# Patient Record
Sex: Female | Born: 1975
Health system: Southern US, Community
[De-identification: ages and names within clinical notes are randomized; demographics above are authoritative.]

## PROBLEM LIST (undated history)

## (undated) ENCOUNTER — Emergency Department (HOSPITAL_COMMUNITY): Payer: Self-pay

## (undated) DIAGNOSIS — G35 Multiple sclerosis: Secondary | ICD-10-CM

## (undated) DIAGNOSIS — J40 Bronchitis, not specified as acute or chronic: Secondary | ICD-10-CM

## (undated) DIAGNOSIS — F5104 Psychophysiologic insomnia: Secondary | ICD-10-CM

## (undated) DIAGNOSIS — K219 Gastro-esophageal reflux disease without esophagitis: Secondary | ICD-10-CM

## (undated) DIAGNOSIS — D509 Iron deficiency anemia, unspecified: Secondary | ICD-10-CM

## (undated) HISTORY — DX: Iron deficiency anemia, unspecified: D50.9

## (undated) HISTORY — DX: Psychophysiologic insomnia: F51.04

## (undated) HISTORY — DX: Morbid (severe) obesity due to excess calories: E66.01

## (undated) HISTORY — DX: Gastro-esophageal reflux disease without esophagitis: K21.9

---

## 1998-04-24 ENCOUNTER — Emergency Department (HOSPITAL_COMMUNITY): Admission: EM | Admit: 1998-04-24 | Discharge: 1998-04-24 | Payer: Self-pay | Admitting: Emergency Medicine

## 1998-11-23 ENCOUNTER — Emergency Department (HOSPITAL_COMMUNITY): Admission: EM | Admit: 1998-11-23 | Discharge: 1998-11-23 | Payer: Self-pay | Admitting: Emergency Medicine

## 1998-11-25 ENCOUNTER — Emergency Department (HOSPITAL_COMMUNITY): Admission: EM | Admit: 1998-11-25 | Discharge: 1998-11-25 | Payer: Self-pay | Admitting: Internal Medicine

## 1999-10-04 ENCOUNTER — Encounter: Payer: Self-pay | Admitting: Emergency Medicine

## 1999-10-04 ENCOUNTER — Emergency Department (HOSPITAL_COMMUNITY): Admission: EM | Admit: 1999-10-04 | Discharge: 1999-10-04 | Payer: Self-pay | Admitting: Emergency Medicine

## 2000-07-31 ENCOUNTER — Emergency Department (HOSPITAL_COMMUNITY): Admission: EM | Admit: 2000-07-31 | Discharge: 2000-07-31 | Payer: Self-pay | Admitting: Emergency Medicine

## 2000-08-01 ENCOUNTER — Emergency Department (HOSPITAL_COMMUNITY): Admission: EM | Admit: 2000-08-01 | Discharge: 2000-08-01 | Payer: Self-pay | Admitting: Emergency Medicine

## 2000-08-04 ENCOUNTER — Emergency Department (HOSPITAL_COMMUNITY): Admission: EM | Admit: 2000-08-04 | Discharge: 2000-08-04 | Payer: Self-pay | Admitting: Emergency Medicine

## 2000-08-30 ENCOUNTER — Emergency Department (HOSPITAL_COMMUNITY): Admission: EM | Admit: 2000-08-30 | Discharge: 2000-08-30 | Payer: Self-pay | Admitting: Internal Medicine

## 2000-09-16 ENCOUNTER — Encounter: Payer: Self-pay | Admitting: Emergency Medicine

## 2000-09-16 ENCOUNTER — Emergency Department (HOSPITAL_COMMUNITY): Admission: EM | Admit: 2000-09-16 | Discharge: 2000-09-16 | Payer: Self-pay | Admitting: Emergency Medicine

## 2000-11-10 ENCOUNTER — Emergency Department (HOSPITAL_COMMUNITY): Admission: EM | Admit: 2000-11-10 | Discharge: 2000-11-10 | Payer: Self-pay | Admitting: Emergency Medicine

## 2000-11-15 ENCOUNTER — Other Ambulatory Visit: Admission: RE | Admit: 2000-11-15 | Discharge: 2000-11-15 | Payer: Self-pay | Admitting: Obstetrics

## 2001-11-01 ENCOUNTER — Emergency Department (HOSPITAL_COMMUNITY): Admission: EM | Admit: 2001-11-01 | Discharge: 2001-11-02 | Payer: Self-pay | Admitting: Emergency Medicine

## 2002-03-17 ENCOUNTER — Emergency Department (HOSPITAL_COMMUNITY): Admission: EM | Admit: 2002-03-17 | Discharge: 2002-03-17 | Payer: Self-pay | Admitting: Emergency Medicine

## 2002-03-17 ENCOUNTER — Encounter: Payer: Self-pay | Admitting: Emergency Medicine

## 2003-02-13 ENCOUNTER — Emergency Department (HOSPITAL_COMMUNITY): Admission: EM | Admit: 2003-02-13 | Discharge: 2003-02-13 | Payer: Self-pay | Admitting: Emergency Medicine

## 2004-07-07 ENCOUNTER — Emergency Department (HOSPITAL_COMMUNITY): Admission: EM | Admit: 2004-07-07 | Discharge: 2004-07-07 | Payer: Self-pay | Admitting: Emergency Medicine

## 2005-07-13 ENCOUNTER — Emergency Department (HOSPITAL_COMMUNITY): Admission: EM | Admit: 2005-07-13 | Discharge: 2005-07-13 | Payer: Self-pay | Admitting: Emergency Medicine

## 2005-10-31 ENCOUNTER — Emergency Department (HOSPITAL_COMMUNITY): Admission: EM | Admit: 2005-10-31 | Discharge: 2005-10-31 | Payer: Self-pay | Admitting: Emergency Medicine

## 2006-03-07 ENCOUNTER — Emergency Department (HOSPITAL_COMMUNITY): Admission: EM | Admit: 2006-03-07 | Discharge: 2006-03-07 | Payer: Self-pay | Admitting: Family Medicine

## 2006-03-08 ENCOUNTER — Emergency Department (HOSPITAL_COMMUNITY): Admission: EM | Admit: 2006-03-08 | Discharge: 2006-03-08 | Payer: Self-pay | Admitting: Emergency Medicine

## 2006-05-09 ENCOUNTER — Emergency Department (HOSPITAL_COMMUNITY): Admission: EM | Admit: 2006-05-09 | Discharge: 2006-05-09 | Payer: Self-pay | Admitting: Emergency Medicine

## 2006-05-17 ENCOUNTER — Other Ambulatory Visit: Admission: RE | Admit: 2006-05-17 | Discharge: 2006-05-17 | Payer: Self-pay | Admitting: *Deleted

## 2006-11-02 ENCOUNTER — Emergency Department (HOSPITAL_COMMUNITY): Admission: EM | Admit: 2006-11-02 | Discharge: 2006-11-02 | Payer: Self-pay | Admitting: Emergency Medicine

## 2006-11-17 ENCOUNTER — Inpatient Hospital Stay (HOSPITAL_COMMUNITY): Admission: AD | Admit: 2006-11-17 | Discharge: 2006-11-18 | Payer: Self-pay | Admitting: Obstetrics

## 2006-11-22 ENCOUNTER — Ambulatory Visit (HOSPITAL_COMMUNITY): Admission: RE | Admit: 2006-11-22 | Discharge: 2006-11-22 | Payer: Self-pay | Admitting: Obstetrics

## 2006-12-06 ENCOUNTER — Ambulatory Visit (HOSPITAL_COMMUNITY): Admission: RE | Admit: 2006-12-06 | Discharge: 2006-12-06 | Payer: Self-pay | Admitting: Obstetrics

## 2006-12-20 ENCOUNTER — Ambulatory Visit (HOSPITAL_COMMUNITY): Admission: RE | Admit: 2006-12-20 | Discharge: 2006-12-20 | Payer: Self-pay | Admitting: Obstetrics

## 2007-02-06 ENCOUNTER — Inpatient Hospital Stay (HOSPITAL_COMMUNITY): Admission: AD | Admit: 2007-02-06 | Discharge: 2007-02-06 | Payer: Self-pay | Admitting: Obstetrics

## 2007-03-16 ENCOUNTER — Inpatient Hospital Stay (HOSPITAL_COMMUNITY): Admission: AD | Admit: 2007-03-16 | Discharge: 2007-03-16 | Payer: Self-pay | Admitting: Obstetrics

## 2007-04-09 ENCOUNTER — Encounter (INDEPENDENT_AMBULATORY_CARE_PROVIDER_SITE_OTHER): Payer: Self-pay | Admitting: Obstetrics

## 2007-04-09 ENCOUNTER — Inpatient Hospital Stay (HOSPITAL_COMMUNITY): Admission: AD | Admit: 2007-04-09 | Discharge: 2007-04-12 | Payer: Self-pay | Admitting: Obstetrics

## 2007-12-31 ENCOUNTER — Emergency Department (HOSPITAL_COMMUNITY): Admission: EM | Admit: 2007-12-31 | Discharge: 2007-12-31 | Payer: Self-pay | Admitting: Family Medicine

## 2008-09-14 ENCOUNTER — Inpatient Hospital Stay (HOSPITAL_COMMUNITY): Admission: AD | Admit: 2008-09-14 | Discharge: 2008-09-14 | Payer: Self-pay | Admitting: Obstetrics

## 2008-12-27 ENCOUNTER — Inpatient Hospital Stay (HOSPITAL_COMMUNITY): Admission: AD | Admit: 2008-12-27 | Discharge: 2008-12-27 | Payer: Self-pay | Admitting: Obstetrics

## 2009-01-14 ENCOUNTER — Inpatient Hospital Stay (HOSPITAL_COMMUNITY): Admission: AD | Admit: 2009-01-14 | Discharge: 2009-01-14 | Payer: Self-pay | Admitting: Obstetrics

## 2009-01-20 ENCOUNTER — Inpatient Hospital Stay (HOSPITAL_COMMUNITY): Admission: RE | Admit: 2009-01-20 | Discharge: 2009-01-23 | Payer: Self-pay | Admitting: Obstetrics

## 2009-09-01 ENCOUNTER — Inpatient Hospital Stay (HOSPITAL_COMMUNITY): Admission: AD | Admit: 2009-09-01 | Discharge: 2009-09-01 | Payer: Self-pay | Admitting: Obstetrics

## 2009-09-01 ENCOUNTER — Ambulatory Visit: Payer: Self-pay | Admitting: Family

## 2009-09-22 ENCOUNTER — Ambulatory Visit (HOSPITAL_COMMUNITY): Admission: RE | Admit: 2009-09-22 | Discharge: 2009-09-22 | Payer: Self-pay | Admitting: Obstetrics

## 2009-10-25 ENCOUNTER — Ambulatory Visit (HOSPITAL_COMMUNITY): Admission: RE | Admit: 2009-10-25 | Discharge: 2009-10-25 | Payer: Self-pay | Admitting: Obstetrics

## 2009-12-17 ENCOUNTER — Ambulatory Visit (HOSPITAL_COMMUNITY): Admission: RE | Admit: 2009-12-17 | Discharge: 2009-12-17 | Payer: Self-pay | Admitting: Obstetrics

## 2009-12-21 ENCOUNTER — Inpatient Hospital Stay (HOSPITAL_COMMUNITY): Admission: AD | Admit: 2009-12-21 | Discharge: 2009-12-22 | Payer: Self-pay | Admitting: Obstetrics

## 2010-02-19 ENCOUNTER — Inpatient Hospital Stay (HOSPITAL_COMMUNITY): Admission: AD | Admit: 2010-02-19 | Discharge: 2010-02-22 | Payer: Self-pay | Admitting: Obstetrics

## 2010-02-19 ENCOUNTER — Encounter (INDEPENDENT_AMBULATORY_CARE_PROVIDER_SITE_OTHER): Payer: Self-pay | Admitting: Obstetrics

## 2010-04-10 ENCOUNTER — Emergency Department (HOSPITAL_COMMUNITY): Admission: EM | Admit: 2010-04-10 | Discharge: 2010-04-10 | Payer: Self-pay | Admitting: Emergency Medicine

## 2010-10-26 ENCOUNTER — Ambulatory Visit (HOSPITAL_COMMUNITY)
Admission: RE | Admit: 2010-10-26 | Discharge: 2010-10-26 | Disposition: A | Discharge status: Home / Self Care | Payer: Medicaid Other | Source: Ambulatory Visit | Attending: Obstetrics | Admitting: Obstetrics

## 2010-10-26 ENCOUNTER — Other Ambulatory Visit (HOSPITAL_COMMUNITY): Payer: Self-pay | Admitting: Obstetrics

## 2010-10-26 DIAGNOSIS — R7611 Nonspecific reaction to tuberculin skin test without active tuberculosis: Secondary | ICD-10-CM

## 2010-11-28 LAB — CBC
HCT: 26.5 % — ABNORMAL LOW (ref 36.0–46.0)
HCT: 28.2 % — ABNORMAL LOW (ref 36.0–46.0)
Hemoglobin: 8.8 g/dL — ABNORMAL LOW (ref 12.0–15.0)
Hemoglobin: 9.2 g/dL — ABNORMAL LOW (ref 12.0–15.0)
MCHC: 33.4 g/dL (ref 30.0–36.0)
Platelets: 305 10*3/uL (ref 150–400)
Platelets: 335 10*3/uL (ref 150–400)
RDW: 15.4 % (ref 11.5–15.5)
WBC: 11 10*3/uL — ABNORMAL HIGH (ref 4.0–10.5)
WBC: 7.7 10*3/uL (ref 4.0–10.5)

## 2010-11-28 LAB — RPR: RPR Ser Ql: NONREACTIVE

## 2010-11-28 LAB — TYPE AND SCREEN: ABO/RH(D): O POS

## 2010-12-12 LAB — CBC
HCT: 32.8 % — ABNORMAL LOW (ref 36.0–46.0)
MCHC: 32.1 g/dL (ref 30.0–36.0)
RBC: 4.3 MIL/uL (ref 3.87–5.11)
RDW: 16.2 % — ABNORMAL HIGH (ref 11.5–15.5)
WBC: 7.2 10*3/uL (ref 4.0–10.5)

## 2010-12-12 LAB — WET PREP, GENITAL: Trich, Wet Prep: NONE SEEN

## 2010-12-12 LAB — GC/CHLAMYDIA PROBE AMP, GENITAL
Chlamydia, DNA Probe: NEGATIVE
GC Probe Amp, Genital: NEGATIVE

## 2010-12-20 LAB — CBC
HCT: 28.2 % — ABNORMAL LOW (ref 36.0–46.0)
HCT: 30.5 % — ABNORMAL LOW (ref 36.0–46.0)
MCHC: 34.3 g/dL (ref 30.0–36.0)
MCV: 77.5 fL — ABNORMAL LOW (ref 78.0–100.0)
Platelets: 283 10*3/uL (ref 150–400)
Platelets: 308 10*3/uL (ref 150–400)
WBC: 6.9 10*3/uL (ref 4.0–10.5)

## 2010-12-20 LAB — CCBB MATERNAL DONOR DRAW

## 2010-12-21 LAB — RAPID STREP SCREEN (MED CTR MEBANE ONLY): Streptococcus, Group A Screen (Direct): NEGATIVE

## 2011-01-24 NOTE — Op Note (Signed)
Teresa Arnold, BUTTACAVOLI NO.:  [id number]   MEDICAL RECORD NO.:  [id number]          PATIENT TYPE:  INP   LOCATION:  9108                          FACILITY:  [facility]   PHYSICIAN:  Kathreen Cosier, M.D.DATE OF BIRTH:  1976/05/30   DATE OF PROCEDURE:  01/20/2009  DATE OF DISCHARGE:                               OPERATIVE REPORT   PREOPERATIVE DIAGNOSES:  Previous cesarean section at term, desires  repeat.   SURGEON:  Kathreen Cosier, MD.   FIRST ASSISTANT:  Charles A. Clearance Coots, MD   ANESTHESIA:  Spinal.   PROCEDURE:  The patient was placed on the operating table in supine  position after the spinal was administered.  Abdomen was prepped and  draped.  Bladder was emptied with Foley catheter.  Transverse suprapubic  incision was made through the old scar, carried down to the rectus  fascia.  Fascia was cleaned and incised to the length of incision.  Recti muscles were retracted laterally.  Peritoneum was incised  longitudinally.  Transverse incision made in the visceral peritoneum  above the bladder, bladder mobilized inferiorly.  Transverse lower  uterine incision made and the fluid was clear.  The patient delivered a  frank breech female, Apgars 8 and 9, weighing 6 pounds 13 ounces.  The  placenta was fundal, removed manually.  Uterine cavity cleaned with dry  laps.  Placenta sent to Labor and Delivery.  Uterine incision closed in  1 layer with continuous suture of #1 chromic.  Hemostasis was  satisfactory.  Uterus well contracted.  Tubes and ovaries normal.  Abdomen closed in layers; peritoneum continuous suture of 0-chromic;  fascia continuous suture of 0-Dexon; and the skin closed with  subcuticular stitch of 4-0 Monocryl.  Blood loss 500 mL.           ______________________________  Kathreen Cosier, M.D.     BAM/MEDQ  D:  01/20/2009  T:  01/21/2009  Job:  [id number]

## 2011-01-24 NOTE — Op Note (Signed)
[name], Teresa Arnold NO.:  [id number]   MEDICAL RECORD NO.:  [id number]          PATIENT TYPE:  INP   LOCATION:  9101                          FACILITY:  [facility]   PHYSICIAN:  Kathreen Cosier, M.D.DATE OF BIRTH:  26-Apr-1976   DATE OF PROCEDURE:  04/09/2007  DATE OF DISCHARGE:                               OPERATIVE REPORT   PREOPERATIVE DIAGNOSIS:  Previous cesarean section at term, desires  repeat, ruptured membranes.   SURGEON:  Dr. Gaynell Face   ANESTHESIA:  Spinal.   PROCEDURE:  The patient placed on the operating table in supine position  after the spinal administered.  Abdomen prepped and draped, bladder  emptied with Foley catheter.  Transverse incision made through the old  scar, carried down to rectus fascia. Fascia cleaned and incised length  of incision.  Recti muscles retracted laterally.  Peritoneum incised  longitudinally. Transverse incision made in the visceral peritoneum of  the bladder.  Bladder mobilized inferiorly.  Transverse lower uterine  incision made.  Fluid clear.  The patient delivered from the LOA  position of a female Apgar nine and nine, weighing 5 pounds 8 ounces.  The team was in attendance.  The placenta was posterior sent to  pathology.  Uterine cavity cleaned with dry laps.  Uterine incision  closed in one layer with continuous suture of #1 chromic.  Hemostasis  satisfactory.  Bladder flap reattached with 2-0 chromic.  Uterus well  contracted.  Tubes and ovaries normal.  Abdomen closed in layers,  peritoneum continuous suture of 0 chromic, fascia continuous suture of 0  Dexon, skin closed with subcuticular stitch of 4-0 Monocryl.  Blood loss  500 mL.  The patient tolerated the procedure well and taken to the  recovery room in good condition.           ______________________________  Kathreen Cosier, M.D.     BAM/MEDQ  D:  04/09/2007  T:  04/10/2007  Job:  [id number]

## 2011-01-27 NOTE — Discharge Summary (Signed)
NAMESEATTLE, DALPORTO NO.:  [id number]   MEDICAL RECORD NO.:  [id number]          PATIENT TYPE:  INP   LOCATION:  9108                          FACILITY:  [facility]   PHYSICIAN:  Kathreen Cosier, M.D.DATE OF BIRTH:  October 07, 1975   DATE OF ADMISSION:  01/20/2009  DATE OF DISCHARGE:  01/23/2009                               DISCHARGE SUMMARY   The patient is a 35 year old gravida 4, para 1-1-1-2, EDC Jan 28, 2009.  She had C-sections x2 and she is now at term, negative GBS and desired  repeat C-section.  She had a repeat low transverse cesarean section, had  a female Apgar of 8 and 9, weighing 6 pounds 13 ounces, frank breech.  On admission, her hemoglobin was 10.3, postop 9.7, platelets 308 and  283.  RPR negative.  HIV negative.  She did well and was discharged on  the third postoperative day, ambulatory, on a regular diet,  to see me  in 6 weeks.   DISCHARGE DIAGNOSIS:  Status post repeat low transverse cesarean section  at term.           ______________________________  Kathreen Cosier, M.D.     BAM/MEDQ  D:  02/03/2009  T:  02/03/2009  Job:  [id number]

## 2011-01-27 NOTE — Discharge Summary (Signed)
[name], BOYUM NO.:  [id number]   MEDICAL RECORD NO.:  [id number]          PATIENT TYPE:  INP   LOCATION:  9101                          [facility]ILITY:  [facility]   PHYSICIAN:  Kathreen Cosier, M.D.DATE OF BIRTH:  1976-02-25   DATE OF ADMISSION:  04/09/2007  DATE OF DISCHARGE:  04/12/2007                               DISCHARGE SUMMARY   The patient is a 35 year old gravida 3, para 1-0-0-1 with EDC of April 23, 2007.  She had previous C-section at term, and desired repeat C-  section.  She was admitted at term with membranes ruptured.  She  underwent repeat low transverse cesarean section.  She had a female with  Apgars of 9 and 9, weighing 5 pounds, 8 ounces.   Postop the patient had some abnormal bleeding and was placed on  Methergine seeing that her hemoglobin was 8.  The patient was  symptomatic with hemoglobin of 8 and received two units of packed cells.  Post transfusion hemoglobin was 10.1.  She did well and was discharged  home on the third postoperative day.   DISCHARGE MEDICATIONS:  1. Iron.  2. Ferrous sulfate 325.  3. Tylox for pain.  4. Methergine.   DISCHARGE DIAGNOSES:  Status post repeat low transverse cesarean section  at term.           ______________________________  Kathreen Cosier, M.D.     BAM/MEDQ  D:  05/08/2007  T:  05/08/2007  Job:  [id number]

## 2011-03-08 ENCOUNTER — Emergency Department (HOSPITAL_COMMUNITY)
Admission: EM | Admit: 2011-03-08 | Discharge: 2011-03-09 | Disposition: A | Discharge status: Home / Self Care | Payer: Medicaid Other | Attending: Emergency Medicine | Admitting: Emergency Medicine

## 2011-03-08 DIAGNOSIS — H539 Unspecified visual disturbance: Secondary | ICD-10-CM | POA: Insufficient documentation

## 2011-03-08 DIAGNOSIS — M545 Low back pain, unspecified: Secondary | ICD-10-CM | POA: Insufficient documentation

## 2011-03-08 DIAGNOSIS — R209 Unspecified disturbances of skin sensation: Secondary | ICD-10-CM | POA: Insufficient documentation

## 2011-03-08 DIAGNOSIS — R5381 Other malaise: Secondary | ICD-10-CM | POA: Insufficient documentation

## 2011-03-09 ENCOUNTER — Encounter (HOSPITAL_COMMUNITY): Payer: Self-pay

## 2011-03-09 ENCOUNTER — Emergency Department (HOSPITAL_COMMUNITY): Payer: Medicaid Other

## 2011-03-09 LAB — URINALYSIS, ROUTINE W REFLEX MICROSCOPIC
Glucose, UA: NEGATIVE mg/dL
Hgb urine dipstick: NEGATIVE
Specific Gravity, Urine: 1.03 (ref 1.005–1.030)
pH: 6.5 (ref 5.0–8.0)

## 2011-03-09 LAB — BASIC METABOLIC PANEL
CO2: 27 mEq/L (ref 19–32)
Chloride: 101 mEq/L (ref 96–112)
Glucose, Bld: 93 mg/dL (ref 70–99)
Potassium: 3.6 mEq/L (ref 3.5–5.1)
Sodium: 138 mEq/L (ref 135–145)

## 2011-03-09 LAB — CBC
HCT: 34 % — ABNORMAL LOW (ref 36.0–46.0)
Hemoglobin: 10.8 g/dL — ABNORMAL LOW (ref 12.0–15.0)
MCHC: 31.8 g/dL (ref 30.0–36.0)
WBC: 9.6 10*3/uL (ref 4.0–10.5)

## 2011-03-09 LAB — POCT PREGNANCY, URINE: Preg Test, Ur: NEGATIVE

## 2011-03-09 LAB — DIFFERENTIAL
Eosinophils Relative: 1 % (ref 0–5)
Lymphs Abs: 3.6 10*3/uL (ref 0.7–4.0)
Monocytes Relative: 7 % (ref 3–12)

## 2011-04-03 ENCOUNTER — Other Ambulatory Visit: Payer: Self-pay | Admitting: Neurology

## 2011-04-03 DIAGNOSIS — R5381 Other malaise: Secondary | ICD-10-CM

## 2011-04-03 DIAGNOSIS — R5383 Other fatigue: Secondary | ICD-10-CM

## 2011-04-03 DIAGNOSIS — R209 Unspecified disturbances of skin sensation: Secondary | ICD-10-CM

## 2011-04-06 ENCOUNTER — Ambulatory Visit
Admission: RE | Admit: 2011-04-06 | Discharge: 2011-04-06 | Disposition: A | Discharge status: Home / Self Care | Payer: Medicaid Other | Source: Ambulatory Visit | Attending: Neurology | Admitting: Neurology

## 2011-04-06 DIAGNOSIS — R209 Unspecified disturbances of skin sensation: Secondary | ICD-10-CM

## 2011-04-06 DIAGNOSIS — R5381 Other malaise: Secondary | ICD-10-CM

## 2011-04-06 MED ORDER — GADOBENATE DIMEGLUMINE 529 MG/ML IV SOLN
10.0000 mL | Freq: Once | INTRAVENOUS | Status: AC | PRN
  Administered 2011-04-06: 10 mL via INTRAVENOUS

## 2011-05-22 ENCOUNTER — Ambulatory Visit: Payer: Medicaid Other | Attending: Neurology | Admitting: Rehabilitative and Restorative Service Providers"

## 2011-05-22 DIAGNOSIS — M6281 Muscle weakness (generalized): Secondary | ICD-10-CM | POA: Insufficient documentation

## 2011-05-22 DIAGNOSIS — IMO0001 Reserved for inherently not codable concepts without codable children: Secondary | ICD-10-CM | POA: Insufficient documentation

## 2011-05-22 DIAGNOSIS — R269 Unspecified abnormalities of gait and mobility: Secondary | ICD-10-CM | POA: Insufficient documentation

## 2011-05-29 ENCOUNTER — Ambulatory Visit: Payer: Medicaid Other | Admitting: Physical Therapy

## 2011-06-02 ENCOUNTER — Ambulatory Visit: Payer: Medicaid Other | Admitting: Physical Therapy

## 2011-06-05 ENCOUNTER — Ambulatory Visit: Payer: Medicaid Other | Admitting: Physical Therapy

## 2011-06-08 ENCOUNTER — Encounter (HOSPITAL_COMMUNITY)
Admission: RE | Admit: 2011-06-08 | Discharge: 2011-06-08 | Disposition: A | Discharge status: Home / Self Care | Payer: Medicaid Other | Source: Ambulatory Visit | Attending: Neurology | Admitting: Neurology

## 2011-06-08 DIAGNOSIS — G35 Multiple sclerosis: Secondary | ICD-10-CM | POA: Insufficient documentation

## 2011-06-09 ENCOUNTER — Ambulatory Visit: Payer: Medicaid Other | Admitting: Rehabilitative and Restorative Service Providers"

## 2011-06-13 ENCOUNTER — Ambulatory Visit: Payer: Medicaid Other | Admitting: Rehabilitative and Restorative Service Providers"

## 2011-06-15 ENCOUNTER — Ambulatory Visit: Payer: Medicaid Other | Admitting: Rehabilitative and Restorative Service Providers"

## 2011-06-20 ENCOUNTER — Ambulatory Visit: Payer: Medicaid Other | Admitting: Rehabilitative and Restorative Service Providers"

## 2011-06-22 ENCOUNTER — Ambulatory Visit: Payer: Medicaid Other | Admitting: Rehabilitative and Restorative Service Providers"

## 2011-06-26 LAB — CROSSMATCH: Antibody Screen: NEGATIVE

## 2011-06-26 LAB — CBC
HCT: 26.1 — ABNORMAL LOW
HCT: 30 — ABNORMAL LOW
HCT: 31 — ABNORMAL LOW
Hemoglobin: 10.1 — ABNORMAL LOW
Hemoglobin: 8.6 — ABNORMAL LOW
MCV: 75.5 — ABNORMAL LOW
Platelets: 334
RBC: 3.82 — ABNORMAL LOW
RBC: 4.1
RDW: 16.3 — ABNORMAL HIGH
WBC: 10.1
WBC: 9.1

## 2011-06-26 LAB — ABO/RH: ABO/RH(D): O POS

## 2011-06-27 LAB — URINALYSIS, ROUTINE W REFLEX MICROSCOPIC
Bilirubin Urine: NEGATIVE
Glucose, UA: NEGATIVE
Hgb urine dipstick: NEGATIVE
Specific Gravity, Urine: >1.03 — ABNORMAL HIGH
Urobilinogen, UA: 0.2
pH: 6

## 2011-07-06 ENCOUNTER — Ambulatory Visit: Payer: Medicaid Other | Attending: Neurology | Admitting: Rehabilitative and Restorative Service Providers"

## 2011-07-06 DIAGNOSIS — IMO0001 Reserved for inherently not codable concepts without codable children: Secondary | ICD-10-CM | POA: Insufficient documentation

## 2011-07-06 DIAGNOSIS — R269 Unspecified abnormalities of gait and mobility: Secondary | ICD-10-CM | POA: Insufficient documentation

## 2011-07-06 DIAGNOSIS — M6281 Muscle weakness (generalized): Secondary | ICD-10-CM | POA: Insufficient documentation

## 2011-07-07 ENCOUNTER — Encounter (HOSPITAL_COMMUNITY)
Admission: RE | Admit: 2011-07-07 | Discharge: 2011-07-07 | Disposition: A | Discharge status: Home / Self Care | Payer: Medicaid Other | Source: Ambulatory Visit | Attending: Neurology | Admitting: Neurology

## 2011-07-07 DIAGNOSIS — G35 Multiple sclerosis: Secondary | ICD-10-CM | POA: Insufficient documentation

## 2011-07-19 ENCOUNTER — Ambulatory Visit: Payer: Medicaid Other | Admitting: Rehabilitative and Restorative Service Providers"

## 2011-08-04 ENCOUNTER — Encounter (HOSPITAL_COMMUNITY): Payer: Medicaid Other

## 2011-09-01 ENCOUNTER — Encounter (HOSPITAL_COMMUNITY): Payer: Medicaid Other

## 2011-09-28 ENCOUNTER — Emergency Department (HOSPITAL_COMMUNITY): Payer: Medicaid Other

## 2011-09-28 ENCOUNTER — Encounter (HOSPITAL_COMMUNITY): Payer: Self-pay | Admitting: Emergency Medicine

## 2011-09-28 ENCOUNTER — Emergency Department (HOSPITAL_COMMUNITY)
Admission: EM | Admit: 2011-09-28 | Discharge: 2011-09-28 | Disposition: A | Discharge status: Home / Self Care | Payer: Medicaid Other | Attending: Emergency Medicine | Admitting: Emergency Medicine

## 2011-09-28 DIAGNOSIS — R209 Unspecified disturbances of skin sensation: Secondary | ICD-10-CM | POA: Insufficient documentation

## 2011-09-28 DIAGNOSIS — M25473 Effusion, unspecified ankle: Secondary | ICD-10-CM | POA: Insufficient documentation

## 2011-09-28 DIAGNOSIS — M25476 Effusion, unspecified foot: Secondary | ICD-10-CM | POA: Insufficient documentation

## 2011-09-28 DIAGNOSIS — M25579 Pain in unspecified ankle and joints of unspecified foot: Secondary | ICD-10-CM | POA: Insufficient documentation

## 2011-09-28 DIAGNOSIS — S93402A Sprain of unspecified ligament of left ankle, initial encounter: Secondary | ICD-10-CM

## 2011-09-28 DIAGNOSIS — W010XXA Fall on same level from slipping, tripping and stumbling without subsequent striking against object, initial encounter: Secondary | ICD-10-CM | POA: Insufficient documentation

## 2011-09-28 DIAGNOSIS — R269 Unspecified abnormalities of gait and mobility: Secondary | ICD-10-CM | POA: Insufficient documentation

## 2011-09-28 DIAGNOSIS — S93409A Sprain of unspecified ligament of unspecified ankle, initial encounter: Secondary | ICD-10-CM | POA: Insufficient documentation

## 2011-09-28 HISTORY — DX: Multiple sclerosis: G35

## 2011-09-28 MED ORDER — TRAMADOL HCL 50 MG PO TABS: 50.0000 mg | ORAL_TABLET | Freq: Four times a day (QID) | ORAL | Status: AC | PRN

## 2011-09-28 MED ORDER — IBUPROFEN 600 MG PO TABS: 600.0000 mg | ORAL_TABLET | Freq: Four times a day (QID) | ORAL | Status: AC | PRN

## 2011-09-28 NOTE — ED Provider Notes (Signed)
History     CSN: [id number]  Arrival date & time 09/28/11  1950   First MD Initiated Contact with Patient 09/28/11 2020      Chief Complaint  Patient presents with  . Foot Injury    left    (Consider location/radiation/quality/duration/timing/severity/associated sxs/prior treatment) Patient is a 36 y.o. female presenting with ankle pain. The history is provided by the patient.  Ankle Pain  The incident occurred 6 to 12 hours ago. The injury mechanism was torsion. The pain is present in the left foot and left ankle. The quality of the pain is described as tingling. The pain is moderate. The pain has been constant since onset. Associated symptoms include inability to bear weight and loss of motion. Pertinent negatives include no numbness, no muscle weakness, no loss of sensation and no tingling. The symptoms are aggravated by bearing weight and palpation. She has tried nothing for the symptoms.    Past Medical History  Diagnosis Date  . MS (multiple sclerosis)     Past Surgical History  Procedure Date  . Cesarean section     No family history on file.  History  Substance Use Topics  . Smoking status: Not on file  . Smokeless tobacco: Not on file  . Alcohol Use:     Review of Systems  Constitutional: Negative for fever and chills.  Musculoskeletal: Positive for joint swelling and gait problem.  Skin: Negative for color change and wound.  Neurological: Negative for tingling, weakness and numbness.    Allergies  Sulfa antibiotics and Gadolinium derivatives  Home Medications   Current Outpatient Rx  Name Route Sig Dispense Refill  . INTERFERON BETA-1A 44 MCG/0.5ML [place] SOLN Subcutaneous Inject 44 mcg into the skin 3 (three) times a week. Mon, wed, fri.      BP 130/73  Pulse 84  Temp(Src) 98.8 F (37.1 C) (Oral)  Resp 18  Wt 255 lb (115.667 kg)  SpO2 100%  LMP 09/27/2011  Physical Exam  Nursing note and vitals reviewed. Constitutional: She appears  well-developed and well-nourished. No distress.  HENT:  Head: Normocephalic and atraumatic.  Right Ear: External ear normal.  Left Ear: External ear normal.  Eyes: Pupils are equal, round, and reactive to light.  Neck: Normal range of motion. Neck supple.  Cardiovascular: Normal rate and regular rhythm.   Pulmonary/Chest: Effort normal. No respiratory distress.  Abdominal: Soft. She exhibits no distension. There is no tenderness.  Musculoskeletal:       Left ankle: She exhibits decreased range of motion and swelling. She exhibits no ecchymosis, no deformity, no laceration and normal pulse. tenderness. Lateral malleolus, AITFL and posterior TFL tenderness found. No head of 5th metatarsal and no proximal fibula tenderness found.       Left foot: She exhibits bony tenderness. She exhibits normal range of motion, no swelling, normal capillary refill, no crepitus, no deformity and no laceration.       Feet:  Neurological:       Decreased sensation to light touch to toes, chronic per pt  Skin: Skin is warm and dry. No erythema.    ED Course  Procedures (including critical care time)  Labs Reviewed - No data to display Dg Ankle Complete Left  09/28/2011  *RADIOLOGY REPORT*  Clinical Data: Fall with left ankle injury.  LEFT ANKLE COMPLETE - 3+ VIEW  Comparison: None.  Findings: No acute fracture or dislocation.  No significant soft tissue swelling.  The ankle mortise shows normal alignment.  IMPRESSION: No  acute fracture.  Original Report Authenticated By: Reola Calkins, M.D.     Dx 1: Ankle sprain, left   MDM  X-rays negative for fx or dislocation. Splint and crutches given.         Elwyn Reach [place], [place] 09/28/11 2214

## 2011-09-28 NOTE — ED Notes (Signed)
Rx given x2 D/c instructions reviewed w/ pt and family - pt and family deny any further questions or concerns at present.  

## 2011-09-28 NOTE — ED Provider Notes (Signed)
Medical screening examination/treatment/procedure(s) were performed by non-physician practitioner and as supervising physician I was immediately available for consultation/collaboration.  Juliet Rude. Rubin Payor, MD 09/28/11 2308

## 2011-09-28 NOTE — Discharge Instructions (Signed)
Ankle Sprain °An ankle sprain is an injury to the ligaments that hold the ankle joint together.  °CAUSES °The injury is usually caused by a fall or by twisting the ankle. It is important to tell your caregiver how the injury occurred and whether or not you were able to walk immediately after the injury.  °SYMPTOMS  °Pain is the primary symptom. It may be present at rest or only when you are trying to stand or walk. The ankle will likely be swollen. Bruising may develop immediately or after 1 or 2 days. It may be difficult or impossible to stand or walk. This depends on the severity of the sprain. °DIAGNOSIS  °Your caregiver can determine if a sprain has occurred based on the accident details and on examination of your ankle. Examination will include pressing and squeezing areas of the foot and ankle. Your caregiver will try to move the ankle in certain ways. X-rays may be used to be sure a bone was not broken, or that the ligament did not pull off of a bone (avulsion). There are standard guidelines that can reliably determine if an X-ray is needed. °TREATMENT  °Rest, ice, elevation, and compression are the basic modes of treatment. Certain types of braces can help stabilize the ankle and allow early return to walking. Your caregiver can make a recommendation for this. Medication may be recommended for pain. You may be referred to an orthopedist or a physical therapist for certain types of severe sprains. °HOME CARE INSTRUCTIONS  °· Apply ice to the sore area for 15 to 20 minutes, 3 to 4 times per day. Do this while you are awake for the first 2 days, or as directed. This can be stopped when the swelling goes away. Put the ice in a plastic bag and place a towel between the bag of ice and your skin.  °· Keep your leg elevated when possible to lessen swelling.  °· If your caregiver recommends crutches, use them as instructed with a non-weight bearing cast for 1 week. Then, you may walk on your ankle as the pain allows,  or as instructed. Gradually, put weight on the affected ankle. Continue to use crutches or a cane until you can walk without causing pain.  °· If a plaster splint was applied, wear the splint until you are seen for a follow-up examination. Rest it on nothing harder than a pillow the first 24 hours. Do not put weight on it. Do not get it wet. You may take it off to take a shower or bath.  °· You may have been given an elastic bandage to use with the plaster splint, or you may have been given a elastic bandage to use alone. The elastic bandage is too tight if you have numbness, tingling, or if your foot becomes cold and blue. Adjust the bandage to make it comfortable.  °· If an air splint was applied, you may blow more air into it or take some out to make it more comfortable. You may take it off at night and to take a shower or bath. Wiggle your toes in the splint several times per day if you are able.  °· Only take over-the-counter or prescription medicines for pain, discomfort, or fever as directed by your caregiver.  °· Do not drive a vehicle until your caregiver specifically tells you it is safe to do so.  °SEEK MEDICAL CARE IF:  °· You have an increase in bruising, swelling, or pain.  °· Your   toes feel cold.  °· Pain relief is not achieved with medications.  °SEEK IMMEDIATE MEDICAL CARE IF: °Your toes are numb or blue or you have severe pain. °MAKE SURE YOU:  °· Understand these instructions.  °· Will watch your condition.  °· Will get help right away if you are not doing well or get worse.  °Document Released: 08/28/2005 Document Revised: 12/02/2010 Document Reviewed: 04/01/2008 °[employer]® Patient Information ©2012 [employer]. °

## 2011-09-28 NOTE — ED Notes (Signed)
Pt alert, nad, c.o left foot pain, onset this afternoon, s/p slip fall injury, pms intact. Unable to bear weight,

## 2011-09-29 ENCOUNTER — Encounter (HOSPITAL_COMMUNITY): Payer: Medicaid Other

## 2011-10-10 ENCOUNTER — Emergency Department (HOSPITAL_COMMUNITY)
Admission: EM | Admit: 2011-10-10 | Discharge: 2011-10-10 | Disposition: A | Discharge status: Home / Self Care | Payer: Medicaid Other | Attending: Emergency Medicine | Admitting: Emergency Medicine

## 2011-10-10 ENCOUNTER — Emergency Department (HOSPITAL_COMMUNITY): Payer: Medicaid Other

## 2011-10-10 ENCOUNTER — Encounter (HOSPITAL_COMMUNITY): Payer: Self-pay | Admitting: Emergency Medicine

## 2011-10-10 DIAGNOSIS — M25539 Pain in unspecified wrist: Secondary | ICD-10-CM | POA: Insufficient documentation

## 2011-10-10 DIAGNOSIS — M25532 Pain in left wrist: Secondary | ICD-10-CM

## 2011-10-10 DIAGNOSIS — R229 Localized swelling, mass and lump, unspecified: Secondary | ICD-10-CM | POA: Insufficient documentation

## 2011-10-10 MED ORDER — OXYCODONE-ACETAMINOPHEN 5-325 MG PO TABS
1.0000 | ORAL_TABLET | Freq: Once | ORAL | Status: AC
  Administered 2011-10-10: 1 via ORAL
  Filled 2011-10-10: qty 1

## 2011-10-10 MED ORDER — METHYLPREDNISOLONE 4 MG PO KIT: PACK | ORAL | Status: DC

## 2011-10-10 MED ORDER — IBUPROFEN 800 MG PO TABS: 800.0000 mg | ORAL_TABLET | Freq: Three times a day (TID) | ORAL | Status: AC

## 2011-10-10 MED ORDER — HYDROCODONE-ACETAMINOPHEN 5-325 MG PO TABS: 1.0000 | ORAL_TABLET | ORAL | Status: AC | PRN

## 2011-10-10 NOTE — ED Provider Notes (Signed)
History     CSN: [id number]  Arrival date & time 10/10/11  2683   First MD Initiated Contact with Patient 10/10/11 2109      Chief Complaint  Patient presents with  . Wrist Pain    (Consider location/radiation/quality/duration/timing/severity/associated sxs/prior treatment) Patient is a 36 y.o. female presenting with wrist pain. The history is provided by the patient.  Wrist Pain This is a new problem. The current episode started yesterday. The problem occurs constantly. The problem has been gradually worsening. Associated symptoms include arthralgias and joint swelling. Pertinent negatives include no fever, nausea, numbness or weakness. The symptoms are aggravated by nothing. She has tried nothing for the symptoms.   Pt woke up with swollen L wrist yesterday; no known injury. States the jt is painful to touch or move. Pain is described as sharp and throbbing. She has never had anything like this before.  Past Medical History  Diagnosis Date  . MS (multiple sclerosis)     Past Surgical History  Procedure Date  . Cesarean section     Family History  Problem Relation Age of Onset  . Hypertension Mother   . Hypertension Other     History  Substance Use Topics  . Smoking status: Never Smoker   . Smokeless tobacco: Not on file  . Alcohol Use: No    OB History    Grav Para Term Preterm Abortions TAB SAB Ect Mult Living                  Review of Systems  Constitutional: Negative for fever, activity change and appetite change.  Gastrointestinal: Negative for nausea.  Musculoskeletal: Positive for joint swelling and arthralgias.  Skin: Negative for color change.  Neurological: Negative for weakness and numbness.    Allergies  Sulfa antibiotics and Gadolinium derivatives  Home Medications   Current Outpatient Rx  Name Route Sig Dispense Refill  . INTERFERON BETA-1A 44 MCG/0.5ML  SOLN Subcutaneous Inject 44 mcg into the skin 3 (three) times a week. Mon, wed,  fri.      BP 141/90  Pulse 84  Temp(Src) 98.9 F (37.2 C) (Oral)  Resp 20  SpO2 100%  LMP 09/27/2011  Physical Exam  Nursing note and vitals reviewed. Constitutional: She appears well-developed and well-nourished. No distress.  HENT:  Head: Normocephalic and atraumatic.  Neck: Normal range of motion.  Cardiovascular: Normal rate.   Pulmonary/Chest: Effort normal.  Musculoskeletal:       Left wrist: She exhibits decreased range of motion, tenderness and swelling. She exhibits no effusion and no crepitus.       Swelling noted to dorsum of wrist on radial aspect. Tender to palpation. No redness, warmth. ROM reduced 2/2 pain. FROM in fingers, good grip strength. Neurovasc intact in radial, median, ulnar nerve dist with sensory intact to lt touch. Cap refill <3.  Neurological: She is alert. She has normal strength. No sensory deficit.  Skin: Skin is warm and dry. She is not diaphoretic.    ED Course  Procedures (including critical care time)  Labs Reviewed - No data to display Dg Wrist Complete Left  10/10/2011  *RADIOLOGY REPORT*  Clinical Data: Severe left wrist pain for 2 days  LEFT WRIST - COMPLETE 3+ VIEW  Comparison: None  Findings: Osseous mineralization grossly normal. Joint spaces preserved. No acute fracture, dislocation, or bone destruction. On the lateral view, a curvilinear density is seen dorsal to the radiocarpal joint. This could represent a minimal soft tissue calcification or superimposed  artifact. Mild dorsal soft tissue swelling is noted at the wrist. No definite donor site is seen to suggest this representing an old fracture.  IMPRESSION: No definite acute bony abnormalities. Small curvilinear question calcific density dorsal to radiocarpal joint on lateral view, nonspecific in appearance; this could represent capsular or soft tissue calcification, a tiny calcified body or an artifact. Soft tissue swelling at dorsum of left wrist.  Original Report Authenticated By: Lollie Marrow, M.D.     No diagnosis found.    MDM  Joint is swollen and painful to touch, but is not red or hot. She is afebrile and nontoxic appearing. Do not suspect gout or septic jt. Will tx with pred and NSAIDs and immobilize in splint. Strict return precautions given.       Grant Fontana, [place] 10/12/11 1128

## 2011-10-10 NOTE — ED Notes (Signed)
Pt states she is having pain in her left wrist that she woke up with on Monday morning  Denies any injury  Left wrist looks swollen and pt states it is painful to touch or move

## 2011-10-10 NOTE — ED Notes (Signed)
Patient transported to X-ray 

## 2011-10-14 NOTE — ED Provider Notes (Signed)
Medical screening examination/treatment/procedure(s) were performed by non-physician practitioner and as supervising physician I was immediately available for consultation/collaboration.  Kammi Hechler, MD 10/14/11 2207 

## 2012-03-16 ENCOUNTER — Emergency Department (HOSPITAL_COMMUNITY)
Admission: EM | Admit: 2012-03-16 | Discharge: 2012-03-16 | Disposition: A | Discharge status: Home / Self Care | Payer: Medicaid Other | Attending: Emergency Medicine | Admitting: Emergency Medicine

## 2012-03-16 ENCOUNTER — Encounter (HOSPITAL_COMMUNITY): Payer: Self-pay

## 2012-03-16 DIAGNOSIS — L259 Unspecified contact dermatitis, unspecified cause: Secondary | ICD-10-CM | POA: Insufficient documentation

## 2012-03-16 DIAGNOSIS — Z8249 Family history of ischemic heart disease and other diseases of the circulatory system: Secondary | ICD-10-CM | POA: Insufficient documentation

## 2012-03-16 DIAGNOSIS — L309 Dermatitis, unspecified: Secondary | ICD-10-CM

## 2012-03-16 DIAGNOSIS — G35 Multiple sclerosis: Secondary | ICD-10-CM | POA: Insufficient documentation

## 2012-03-16 LAB — URINE MICROSCOPIC-ADD ON

## 2012-03-16 LAB — URINALYSIS, ROUTINE W REFLEX MICROSCOPIC
Nitrite: NEGATIVE
Protein, ur: NEGATIVE mg/dL
Specific Gravity, Urine: 1.025 (ref 1.005–1.030)
Urobilinogen, UA: 0.2 mg/dL (ref 0.0–1.0)

## 2012-03-16 LAB — WET PREP, GENITAL
Trich, Wet Prep: NONE SEEN
Yeast Wet Prep HPF POC: NONE SEEN

## 2012-03-16 MED ORDER — CLOTRIMAZOLE-BETAMETHASONE 1-0.05 % EX CREA: TOPICAL_CREAM | CUTANEOUS | Status: AC

## 2012-03-16 NOTE — ED Provider Notes (Signed)
History     CSN: [id number]  Arrival date & time 03/16/12  1701   First MD Initiated Contact with Patient 03/16/12 1840      Chief Complaint  Patient presents with  . Vaginitis    (Consider location/radiation/quality/duration/timing/severity/associated sxs/prior treatment) The history is provided by the patient.   36 y/o female INAD c/o painful sensation to perineum x1 week. Pt was treated for UTI with cipro and is clear that painful sensation is external towrads the recatl area. Denies rash, discharge, fever and abdominal pain.   Past Medical History  Diagnosis Date  . MS (multiple sclerosis)     Past Surgical History  Procedure Date  . Cesarean section     Family History  Problem Relation Age of Onset  . Hypertension Mother   . Hypertension Other     History  Substance Use Topics  . Smoking status: Never Smoker   . Smokeless tobacco: Not on file  . Alcohol Use: No    OB History    Grav Para Term Preterm Abortions TAB SAB Ect Mult Living                  Review of Systems  All other systems reviewed and are negative.    Allergies  Sulfa antibiotics and Gadolinium derivatives  Home Medications   Current Outpatient Rx  Name Route Sig Dispense Refill  . INTERFERON BETA-1A 44 MCG/0.5ML [place] SOLN Subcutaneous Inject 44 mcg into the skin 3 (three) times a week. Mon, wed, fri.      BP 120/67  Pulse 83  Temp 98.6 F (37 C) (Oral)  Resp 16  Wt 242 lb 12.8 oz (110.133 kg)  SpO2 100%  LMP 02/14/2012  Physical Exam  Nursing note and vitals reviewed. Constitutional: She is oriented to person, place, and time. She appears well-developed and well-nourished. No distress.  HENT:  Head: Normocephalic.  Eyes: Conjunctivae and EOM are normal.  Cardiovascular: Normal rate.   Pulmonary/Chest: Effort normal and breath sounds normal.  Abdominal: Soft. Bowel sounds are normal. She exhibits no distension. There is no tenderness. There is no rebound.    Genitourinary: Vagina normal and uterus normal.    Pelvic exam was performed with patient supine. There is no rash, tenderness, lesion or injury on the right labia. There is no rash, tenderness, lesion or injury on the left labia. Uterus is not tender. Cervix exhibits friability. Cervix exhibits no motion tenderness and no discharge. Right adnexum displays no mass, no tenderness and no fullness. Left adnexum displays no mass, no tenderness and no fullness. No erythema, tenderness or bleeding around the vagina. No foreign body around the vagina. No signs of injury around the vagina. No vaginal discharge found.       Dry flaking skin to posterior perineum.   Musculoskeletal: Normal range of motion.  Neurological: She is alert and oriented to person, place, and time.  Psychiatric: She has a normal mood and affect.    ED Course  Procedures (including critical care time)  Labs Reviewed  URINALYSIS, ROUTINE W REFLEX MICROSCOPIC - Abnormal; Notable for the following:    APPearance CLOUDY (*)     Ketones, ur TRACE (*)     Leukocytes, UA LARGE (*)     All other components within normal limits  URINE MICROSCOPIC-ADD ON - Abnormal; Notable for the following:    Squamous Epithelial / LPF MANY (*)     All other components within normal limits  WET PREP, GENITAL  POCT  PREGNANCY, URINE  GC/CHLAMYDIA PROBE AMP, GENITAL   No results found.   1. Dermatitis       MDM  Pt with irritation to skin of perineum at urination. Pelvic exam shows flaking of skin to the posterior perineum. Cervix friable, instructed Pt to f/iu with ob/gyn. Will treat as dermatitis with Lotrisone.  Pt verbalized understanding and agrees with care plan. Outpatient follow-up and return precautions given.           Wynetta Emery, PA-C 03/16/12 2036

## 2012-03-16 NOTE — ED Provider Notes (Signed)
Medical screening examination/treatment/procedure(s) were performed by non-physician practitioner and as supervising physician I was immediately available for consultation/collaboration.   Benny Lennert, MD 03/16/12 2245

## 2012-03-16 NOTE — ED Notes (Signed)
Pt in with vaginal irritation x1 week states burning with urination recently tx for uti states some discharge with odor

## 2012-08-03 ENCOUNTER — Encounter (HOSPITAL_COMMUNITY): Payer: Self-pay | Admitting: Emergency Medicine

## 2012-08-03 ENCOUNTER — Emergency Department (HOSPITAL_COMMUNITY)
Admission: EM | Admit: 2012-08-03 | Discharge: 2012-08-03 | Disposition: A | Discharge status: Home / Self Care | Payer: Medicaid Other | Source: Home / Self Care

## 2012-08-03 DIAGNOSIS — N390 Urinary tract infection, site not specified: Secondary | ICD-10-CM

## 2012-08-03 LAB — POCT URINALYSIS DIP (DEVICE)
Bilirubin Urine: NEGATIVE
Glucose, UA: NEGATIVE mg/dL
Hgb urine dipstick: NEGATIVE
Nitrite: NEGATIVE

## 2012-08-03 MED ORDER — CEPHALEXIN 500 MG PO CAPS: 500.0000 mg | ORAL_CAPSULE | Freq: Three times a day (TID) | ORAL | Status: DC

## 2012-08-03 NOTE — ED Provider Notes (Signed)
Medical screening examination/treatment/procedure(s) were performed by resident physician or non-physician practitioner and as supervising physician I was immediately available for consultation/collaboration.   KINDL,JAMES DOUGLAS MD.    James D Kindl, MD 08/03/12 1831 

## 2012-08-03 NOTE — ED Provider Notes (Signed)
History     CSN: [id number]  Arrival date & time 08/03/12  1545   None     Chief Complaint  Patient presents with  . Dysuria    (Consider location/radiation/quality/duration/timing/severity/associated sxs/prior treatment) HPI Comments: 36 year old female presents with urinary frequency, dysuria, and in cloudy urine. She is also voiding small amounts at a time. Her symptoms began approximately 2 weeks ago. She denies pain anywhere, no back or flank pain denies GI symptoms and denies vaginal discharge, bleeding or vulvovaginal symptoms.   Past Medical History  Diagnosis Date  . MS (multiple sclerosis)     Past Surgical History  Procedure Date  . Cesarean section     Family History  Problem Relation Age of Onset  . Hypertension Mother   . Hypertension Other     History  Substance Use Topics  . Smoking status: Never Smoker   . Smokeless tobacco: Not on file  . Alcohol Use: No    OB History    Grav Para Term Preterm Abortions TAB SAB Ect Mult Living                  Review of Systems  Constitutional: Negative.   Respiratory: Negative.   Cardiovascular: Negative.   Gastrointestinal: Negative.   Genitourinary:       Per history of present illness  Musculoskeletal: Negative.   Neurological: Negative.     Allergies  Sulfa antibiotics; Gadolinium derivatives; and Tysabri  Home Medications   Current Outpatient Rx  Name  Route  Sig  Dispense  Refill  . CEPHALEXIN 500 MG PO CAPS   Oral   Take 1 capsule (500 mg total) by mouth 3 (three) times daily.   21 capsule   0   . CLOTRIMAZOLE-BETAMETHASONE 1-0.05 % EX CREA      Apply to affected area 2 times daily prn   15 g   0   . INTERFERON BETA-1A 44 MCG/0.5ML  SOLN   Subcutaneous   Inject 44 mcg into the skin 3 (three) times a week. Mon, wed, fri.           BP 118/83  Pulse 67  Temp 98.3 F (36.8 C) (Oral)  Resp 17  SpO2 100%  Physical Exam  Constitutional: She is oriented to person, place,  and time. She appears well-developed and well-nourished. No distress.  HENT:  Head: Normocephalic and atraumatic.  Mouth/Throat: No oropharyngeal exudate.  Eyes: EOM are normal.  Neck: Normal range of motion. Neck supple.  Pulmonary/Chest: Effort normal. No respiratory distress.  Abdominal: Soft. There is no tenderness.  Musculoskeletal: Normal range of motion.  Neurological: She is alert and oriented to person, place, and time. No cranial nerve deficit.  Skin: Skin is warm and dry.  Psychiatric: She has a normal mood and affect.    ED Course  Procedures (including critical care time)   Labs Reviewed  URINE CULTURE   No results found.   1. UTI (lower urinary tract infection)       MDM  Keflex 500 mg 3 times a day for 7 days Azo one to 2 tablets 3 times a day when necessary urinary symptoms Drink plenty of fluids ; may also drink fluids other than cranberry juice A urine culture will be obtained.          Hayden Rasmussen, NP 08/03/12 1746

## 2012-08-03 NOTE — ED Notes (Signed)
Reports difficulty urinating for two weeks.  Reports urinating frequently with burning.   Patient only was drinking juice to help sx's.   No OTC meds taken.

## 2012-08-05 LAB — URINE CULTURE: Colony Count: >=100000

## 2013-02-13 ENCOUNTER — Ambulatory Visit (INDEPENDENT_AMBULATORY_CARE_PROVIDER_SITE_OTHER): Payer: Medicaid Other | Admitting: Nurse Practitioner

## 2013-02-13 ENCOUNTER — Encounter: Payer: Self-pay | Admitting: Nurse Practitioner

## 2013-02-13 VITALS — BP 127/80 | HR 66 | Ht 64.0 in | Wt 227.0 lb

## 2013-02-13 DIAGNOSIS — G35 Multiple sclerosis: Secondary | ICD-10-CM

## 2013-02-13 LAB — COMPREHENSIVE METABOLIC PANEL
AST: 14 IU/L (ref 0–40)
Albumin/Globulin Ratio: 1.3 (ref 1.1–2.5)
Alkaline Phosphatase: 70 IU/L (ref 39–117)
BUN/Creatinine Ratio: 19 (ref 8–20)
Creatinine, Ser: 0.53 mg/dL — ABNORMAL LOW (ref 0.57–1.00)
GFR calc non Af Amer: 124 mL/min/{1.73_m2} (ref >59)
Globulin, Total: 3.1 g/dL (ref 1.5–4.5)
Sodium: 139 mmol/L (ref 134–144)

## 2013-02-13 LAB — CBC WITH DIFFERENTIAL/PLATELET
Basos: 0 % (ref 0–3)
Eosinophils Absolute: 0.1 10*3/uL (ref 0.0–0.4)
HCT: 34.1 % (ref 34.0–46.6)
Hemoglobin: 11.5 g/dL (ref 11.1–15.9)
Lymphs: 46 % (ref 14–46)
MCH: 25.6 pg — ABNORMAL LOW (ref 26.6–33.0)
Monocytes: 18 % — ABNORMAL HIGH (ref 4–12)
Neutrophils Absolute: 1.4 10*3/uL (ref 1.4–7.0)
RBC: 4.5 x10E6/uL (ref 3.77–5.28)

## 2013-02-13 NOTE — Progress Notes (Signed)
HPI: Patient returns for followup after last visit 08/15/2012 she has a history of multiple sclerosis relapsing remitting. She was on Tysabri at one time but had an allergy to the medication. She was then started on Rebif 3 times a week. She denies injection site problems, last exacerbation occurred in September of last year.She denies spasms, focal weakness, sensory changes, visual changes, speech or swallowing problems, no problems with bowel or bladder function. MRI scan of the brain 03/2011 shows multiple subcortical, periventricular, brainstem, cerebellar and upper cervical cord white matter hyperintensities quite typical for demyelinating disease.  Enhancing lesions are noted in the right occipital and left parietal periventricular regions.  The presence of T1 black holes indicates chronic disease.   No significant atrophy is seen. MRI of the cervical spine with remote age  demyelinating plaques at C2-C3 and lower brainstem and left cerebellum. No enhancing lesions. She has no new complaints today. She needs labs drawn   ROS:  negative  Physical Exam General: well developed, obese female  seated, in no evident distress Head: head normocephalic and atraumatic. Oropharynx benign Neck: supple with no carotid  bruits Cardiovascular: regular rate and rhythm, no murmurs  Neurologic Exam Mental Status: Awake and fully alert. Oriented to place and time. Follows all commands. Speech and language normal.   Cranial Nerves: Fundoscopic exam reveals flat discs. Pupils equal, briskly reactive to light. Extraocular movements full without nystagmus. Visual fields full to confrontation. Hearing intact and symmetric to finger snap. Facial sensation intact. Face, tongue, palate move normally and symmetrically. Neck flexion and extension normal.  Motor: Normal bulk and tone. Normal strength in all tested extremity muscles.No focal weakness Sensory.: intact to touch and pinprick and vibratory.  Coordination: Rapid  alternating movements normal in all extremities. Finger-to-nose and heel-to-shin performed accurately bilaterally. Gait and Station: Arises from chair without difficulty. Stance is normal. Gait demonstrates normal stride length and balance . Able to heel, toe and mildly unsteady with tandem walk.  Reflexes: 2+ and symmetric. Toes downgoing.     ASSESSMENT: Relapsing remitting multiple sclerosis,MRI scan of the brain 03/2011 shows multiple subcortical, periventricular, brainstem, cerebellar and upper cervical cord white matter hyperintensities quite typical for demyelinating disease.  Enhancing lesions are noted in the right occipital and left parietal periventricular regions.  The presence of T1 black holes indicates chronic disease.   No significant atrophy is seen  Morbid obesity   PLAN: Will continue Rebif 3 times a week. She will get CBC and CMP today She was encouraged to exercise for overall health   Nilda Riggs, GNP-BC APRN

## 2013-02-13 NOTE — Patient Instructions (Addendum)
Continue Rebif  3 times weekly Check labs today to monitor side effects of medication F/U  6 months

## 2013-02-13 NOTE — Progress Notes (Signed)
I have read the note, and I agree with the clinical assessment and plan.  

## 2013-02-17 ENCOUNTER — Other Ambulatory Visit: Payer: Self-pay | Admitting: Neurology

## 2013-08-12 ENCOUNTER — Encounter (HOSPITAL_COMMUNITY): Payer: Self-pay | Admitting: Emergency Medicine

## 2013-08-12 ENCOUNTER — Emergency Department (HOSPITAL_COMMUNITY)
Admission: EM | Admit: 2013-08-12 | Discharge: 2013-08-12 | Disposition: A | Discharge status: Home / Self Care | Payer: Medicaid Other | Source: Home / Self Care | Attending: Emergency Medicine | Admitting: Emergency Medicine

## 2013-08-12 DIAGNOSIS — N3000 Acute cystitis without hematuria: Secondary | ICD-10-CM

## 2013-08-12 HISTORY — DX: Bronchitis, not specified as acute or chronic: J40

## 2013-08-12 LAB — POCT PREGNANCY, URINE: Preg Test, Ur: NEGATIVE

## 2013-08-12 LAB — POCT URINALYSIS DIP (DEVICE)
Nitrite: POSITIVE — AB
Protein, ur: NEGATIVE mg/dL
pH: 7 (ref 5.0–8.0)

## 2013-08-12 MED ORDER — PHENAZOPYRIDINE HCL 200 MG PO TABS: 200.0000 mg | ORAL_TABLET | Freq: Three times a day (TID) | ORAL | Status: DC | PRN

## 2013-08-12 MED ORDER — CEPHALEXIN 500 MG PO CAPS: 500.0000 mg | ORAL_CAPSULE | Freq: Three times a day (TID) | ORAL | Status: DC

## 2013-08-12 NOTE — ED Notes (Signed)
C/o burning with urination onset 1 1/2 weeks. C/o frequency in small amounts. No fever.

## 2013-08-12 NOTE — ED Provider Notes (Signed)
Chief Complaint:   Chief Complaint  Patient presents with  . Urinary Tract Infection    History of Present Illness:   Teresa Arnold is a 37 year old female with multiple sclerosis who has had a one half week history of dysuria, frequency, urgency, and malodorous urine. She denies fever, chills, back pain, abdominal pain, hematuria, or GYN symptoms. She has had urinary tract infections before, during her pregnancy. She thinks she may have miscarried one to 2 months ago. Her last menstrual period was November 13. She had some pain in her right flank last week, but none this week.  Review of Systems:  Other than noted above, the patient denies any of the following symptoms: General:  No fevers, chills, sweats, aches, or fatigue. GI:  No abdominal pain, back pain, nausea, vomiting, diarrhea, or constipation. GU:  No dysuria, frequency, urgency, hematuria, or incontinence. GYN:  No discharge, itching, vulvar pain or lesions, pelvic pain, or abnormal vaginal bleeding.  PMFSH:  Past medical history, family history, social history, meds, and allergies were reviewed.  She has multiple sclerosis and takes Rebif. She is allergic to contrast dye, sulfa, and Tysabri.  Physical Exam:   Vital signs:  BP 139/67  Pulse 77  Temp(Src) 98.1 F (36.7 C) (Oral)  Resp 16  SpO2 100%  LMP 07/24/2013 Gen:  Alert, oriented, in no distress. Lungs:  Clear to auscultation, no wheezes, rales or rhonchi. Heart:  Regular rhythm, no gallop or murmer. Abdomen:  Flat and soft. There was no suprapubic pain to palpation.  No guarding, or rebound.  No hepato-splenomegaly or mass.  Bowel sounds were normally active.  No hernia. Back:  No CVA tenderness.  Skin:  Clear, warm and dry.  Labs:    Results for orders placed during the hospital encounter of 08/12/13  POCT URINALYSIS DIP (DEVICE)      Result Value Range   Glucose, UA NEGATIVE  NEGATIVE mg/dL   Bilirubin Urine NEGATIVE  NEGATIVE   Ketones, ur NEGATIVE   NEGATIVE mg/dL   Specific Gravity, Urine 1.020  1.005 - 1.030   Hgb urine dipstick TRACE (*) NEGATIVE   pH 7.0  5.0 - 8.0   Protein, ur NEGATIVE  NEGATIVE mg/dL   Urobilinogen, UA 0.2  0.0 - 1.0 mg/dL   Nitrite POSITIVE (*) NEGATIVE   Leukocytes, UA LARGE (*) NEGATIVE  POCT PREGNANCY, URINE      Result Value Range   Preg Test, Ur NEGATIVE  NEGATIVE     A urine culture was obtained.  Results are pending at this time and we will call about any positive results.  Assessment: The encounter diagnosis was Acute cystitis.   No evidence of pyelonephritis.  Plan:   1.  Meds:  The following meds were prescribed:   Discharge Medication List as of 08/12/2013  7:40 PM    START taking these medications   Details  cephALEXin (KEFLEX) 500 MG capsule Take 1 capsule (500 mg total) by mouth 3 (three) times daily., Starting 08/12/2013, Until Discontinued, Normal    phenazopyridine (PYRIDIUM) 200 MG tablet Take 1 tablet (200 mg total) by mouth 3 (three) times daily as needed for pain., Starting 08/12/2013, Until Discontinued, Normal        2.  Patient Education/Counseling:  The patient was given appropriate handouts, self care instructions, and instructed in symptomatic relief. The patient was told to avoid intercourse for 10 days, get extra fluids, and return for a follow up with her primary care doctor at the completion  of treatment for a repeat UA and culture.    3.  Follow up:  The patient was told to follow up if no better in 3 to 4 days, if becoming worse in any way, and given some red flag symptoms such as fever, back pain, or persistent vomiting which would prompt immediate return.  Follow up here or at the emergency department as needed.     Reuben Likes, MD 08/12/13 [phone]

## 2013-08-14 LAB — URINE CULTURE

## 2013-08-14 NOTE — ED Notes (Signed)
Urine culture:>100,000 colonies E. Coli. Pt. adequately treated with Keflex. Teresa Arnold 08/14/2013

## 2013-08-15 ENCOUNTER — Other Ambulatory Visit: Payer: Self-pay

## 2013-08-15 ENCOUNTER — Ambulatory Visit (INDEPENDENT_AMBULATORY_CARE_PROVIDER_SITE_OTHER): Payer: Medicaid Other | Admitting: Nurse Practitioner

## 2013-08-15 ENCOUNTER — Encounter: Payer: Self-pay | Admitting: Nurse Practitioner

## 2013-08-15 VITALS — BP 127/81 | HR 62 | Ht 64.5 in | Wt 233.0 lb

## 2013-08-15 DIAGNOSIS — Z79899 Other long term (current) drug therapy: Secondary | ICD-10-CM

## 2013-08-15 DIAGNOSIS — G35 Multiple sclerosis: Secondary | ICD-10-CM

## 2013-08-15 LAB — COMPREHENSIVE METABOLIC PANEL
AST: 17 IU/L (ref 0–40)
Albumin: 3.8 g/dL (ref 3.5–5.5)
BUN: 13 mg/dL (ref 6–20)
CO2: 29 mmol/L (ref 18–29)
Calcium: 9.2 mg/dL (ref 8.7–10.2)
Creatinine, Ser: 0.57 mg/dL (ref 0.57–1.00)
Globulin, Total: 4.1 g/dL (ref 1.5–4.5)
Sodium: 138 mmol/L (ref 134–144)

## 2013-08-15 MED ORDER — INTERFERON BETA-1A 44 MCG/0.5ML ~~LOC~~ SOLN: 44.0000 ug | SUBCUTANEOUS | Status: DC

## 2013-08-15 NOTE — Progress Notes (Signed)
I have read the note, and I agree with the clinical assessment and plan.  WILLIS,CHARLES KEITH   

## 2013-08-15 NOTE — Progress Notes (Signed)
[facility]  PATIENT: Teresa Arnold DOB: [birth year]   REASON FOR VISIT: follow up for MS    HISTORY OF PRESENT ILLNESS: Teresa Arnold, 37 year old white female returns for followup. She was last seen in this office 02/13/2013. She has a history of relapsing remitting multiple sclerosis. She had an allergic reaction to Tysabri in the past. She is currently on Rebif 3 times a week. She has a urinary tract infection at present and she had some increased weakness in her lower extremities due to this,  pseudo-exacerbation. Currently on the second day of antibiotic therapy and says  she feels much better. She has not fallen, she denies any visual problems, she does have painful urination at present and she complains of aching muscles. She has not had any injection site abnormalities.     HISTORY: of multiple sclerosis relapsing remitting. She was on Tysabri at one time but had an allergy to the medication. She was then started on Rebif 3 times a week. She denies injection site problems, last exacerbation occurred in September of last year.She denies spasms, focal weakness, sensory changes, visual changes, speech or swallowing problems, no problems with bowel or bladder function. MRI scan of the brain 03/2011 shows multiple subcortical, periventricular, brainstem, cerebellar and upper cervical cord white matter hyperintensities quite typical for demyelinating disease. Enhancing lesions are noted in the right occipital and left parietal periventricular regions. The presence of T1 black holes indicates chronic disease. No significant atrophy is seen. MRI of the cervical spine with remote age demyelinating plaques at C2-C3 and lower brainstem and left cerebellum. No enhancing lesions. She has no new complaints today. She needs labs drawn      REVIEW OF SYSTEMS: Full 14 system review of systems performed and notable only for those listed, all others are neg:  Constitutional: N/A    Cardiovascular: N/A  Ear/Nose/Throat: N/A  Skin: N/A  Eyes: N/A  Respiratory: N/A  Gastroitestinal: N/A  Genitourinary: Painful urination urgency and frequency, incontinence Hematology/Lymphatic: N/A  Endocrine: N/A Musculoskeletal: Aching muscles Allergy/Immunology: N/A  Neurological: N/A Psychiatric: N/A   ALLERGIES: Allergies  Allergen Reactions  . Sulfa Antibiotics   . Gadolinium Derivatives Nausea And Vomiting    Pt only received half dose of 10ml before getting sick.   Teresa Arnold [Natalizumab] Rash    HOME MEDICATIONS: Outpatient Prescriptions Prior to Visit  Medication Sig Dispense Refill  . cephALEXin (KEFLEX) 500 MG capsule Take 1 capsule (500 mg total) by mouth 3 (three) times daily.  30 capsule  0  . phenazopyridine (PYRIDIUM) 200 MG tablet Take 1 tablet (200 mg total) by mouth 3 (three) times daily as needed for pain.  15 tablet  0  . REBIF REBIDOSE 44 MCG/0.5ML injection inject subcutaneously 3 days a week  6 mL  5   No facility-administered medications prior to visit.    PAST MEDICAL HISTORY: Past Medical History  Diagnosis Date  . MS (multiple sclerosis)   . Bronchitis     PAST SURGICAL HISTORY: Past Surgical History  Procedure Laterality Date  . Cesarean section      x4    FAMILY HISTORY: Family History  Problem Relation Age of Onset  . Hypertension Mother   . Hypertension Other     SOCIAL HISTORY: History   Social History  . Marital Status: Single    Spouse Name: N/A    Number of Children: 4  . Years of Education: 14   Occupational History  . Not on file.  Social History Main Topics  . Smoking status: Never Smoker   . Smokeless tobacco: Never Used  . Alcohol Use: Yes     Comment: ocassional  . Drug Use: No  . Sexual Activity: Not on file   Other Topics Concern  . Not on file   Social History Narrative   Patient is single and lives with her family.   Patient has four children.   Patient has a college education.    Patient is right-handed.   Patient does not drink any caffeine.     PHYSICAL EXAM  Filed Vitals:   08/15/13 1108  BP: 127/81  Pulse: 62  Height: 5' 4.5" (1.638 m)  Weight: 233 lb (105.688 kg)   Body mass index is 39.39 kg/(m^2).  Generalized: Well developed, morbidly obese female in no acute distress  Head: normocephalic and atraumatic,. Oropharynx benign  Neck: Supple, no carotid bruits  Cardiac: Regular rate rhythm, no murmur  Musculoskeletal: No deformity   Neurological examination   Mentation: Alert oriented to time, place, history taking. Follows all commands speech and language fluent  Cranial nerve II-XII: Fundoscopic exam reveals flat  disc margins.Pupils were equal round reactive to light extraocular movements were full, visual field were full on confrontational test. Facial sensation and strength were normal. hearing was intact to finger rubbing bilaterally. Uvula tongue midline. head turning and shoulder shrug were normal and symmetric.Tongue protrusion into cheek strength was normal. Motor: normal bulk and tone, full strength in the BUE, BLE, fine finger movements normal, no pronator drift. No focal weakness Sensory: normal and symmetric to light touch, pinprick, and  vibration  Coordination: finger-nose-finger, heel-to-shin bilaterally, no dysmetria Reflexes: Brachioradialis 2/2, biceps 2/2, triceps 2/2, patellar 2/2, Achilles 2/2, plantar responses were flexor bilaterally. Gait and Station: Rising up from seated position without assistance, normal stance,  moderate stride, good arm swing, smooth turning, able to perform tiptoe, and heel walking without difficulty. Tandem gait is steady  DIAGNOSTIC DATA (LABS, IMAGING, TESTING) - I reviewed patient records, labs, notes, testing and imaging myself where available.  Lab Results  Component Value Date   WBC 4.1 02/13/2013   HGB 11.5 02/13/2013   HCT 34.1 02/13/2013   MCV 76* 02/13/2013   PLT 462* 03/09/2011      Component  Value Date/Time   NA 139 02/13/2013 1121   NA 138 03/09/2011 0001   K 3.9 02/13/2013 1121   CL 103 02/13/2013 1121   CO2 29* 02/13/2013 1121   GLUCOSE 82 02/13/2013 1121   GLUCOSE 93 03/09/2011 0001   BUN 10 02/13/2013 1121   BUN 17 03/09/2011 0001   CREATININE 0.53* 02/13/2013 1121   CALCIUM 9.2 02/13/2013 1121   PROT 7.2 02/13/2013 1121   AST 14 02/13/2013 1121   ALT 9 02/13/2013 1121   ALKPHOS 70 02/13/2013 1121   BILITOT 0.3 02/13/2013 1121   GFRNONAA 124 02/13/2013 1121   GFRAA 143 02/13/2013 1121     ASSESSMENT AND PLAN  37 y.o. year old female  has a past medical history of MS (multiple sclerosis) currently on Rebif 3 times a week without side effects. No site of injection problems. Currently has a UTI and in the second day of treatment.  Continue Rebif 3 times weekly Make sure you take all of the Keflex for urinary tract infection Will check liver function today Followup in 6 months Nilda Riggs, [facility], [facility], APRN  [facility] [address] [place], [place] [address] [phone]

## 2013-08-15 NOTE — Patient Instructions (Signed)
Continue Rebif 3 times weekly Make sure you take all of the Keflex for urinary tract infection Will check liver function today Followup in 6 months

## 2013-08-19 ENCOUNTER — Emergency Department (HOSPITAL_COMMUNITY): Payer: Medicaid Other

## 2013-08-19 ENCOUNTER — Emergency Department (HOSPITAL_COMMUNITY)
Admission: EM | Admit: 2013-08-19 | Discharge: 2013-08-19 | Disposition: A | Discharge status: Home / Self Care | Payer: Medicaid Other | Attending: Emergency Medicine | Admitting: Emergency Medicine

## 2013-08-19 ENCOUNTER — Encounter (HOSPITAL_COMMUNITY): Payer: Self-pay | Admitting: Emergency Medicine

## 2013-08-19 DIAGNOSIS — Z792 Long term (current) use of antibiotics: Secondary | ICD-10-CM | POA: Insufficient documentation

## 2013-08-19 DIAGNOSIS — Z79899 Other long term (current) drug therapy: Secondary | ICD-10-CM | POA: Insufficient documentation

## 2013-08-19 DIAGNOSIS — K560 Paralytic ileus: Secondary | ICD-10-CM | POA: Insufficient documentation

## 2013-08-19 DIAGNOSIS — G35 Multiple sclerosis: Secondary | ICD-10-CM | POA: Insufficient documentation

## 2013-08-19 DIAGNOSIS — K567 Ileus, unspecified: Secondary | ICD-10-CM

## 2013-08-19 DIAGNOSIS — Z8709 Personal history of other diseases of the respiratory system: Secondary | ICD-10-CM | POA: Insufficient documentation

## 2013-08-19 DIAGNOSIS — Z3202 Encounter for pregnancy test, result negative: Secondary | ICD-10-CM | POA: Insufficient documentation

## 2013-08-19 DIAGNOSIS — M545 Low back pain, unspecified: Secondary | ICD-10-CM | POA: Insufficient documentation

## 2013-08-19 DIAGNOSIS — Z87440 Personal history of urinary (tract) infections: Secondary | ICD-10-CM | POA: Insufficient documentation

## 2013-08-19 LAB — URINALYSIS, ROUTINE W REFLEX MICROSCOPIC
Bilirubin Urine: NEGATIVE
Glucose, UA: NEGATIVE mg/dL
Hgb urine dipstick: NEGATIVE
Ketones, ur: NEGATIVE mg/dL
Nitrite: NEGATIVE
Protein, ur: NEGATIVE mg/dL

## 2013-08-19 LAB — CBC WITH DIFFERENTIAL/PLATELET
Eosinophils Absolute: 0.1 10*3/uL (ref 0.0–0.7)
Eosinophils Relative: 1 % (ref 0–5)
HCT: 35.6 % — ABNORMAL LOW (ref 36.0–46.0)
Lymphocytes Relative: 19 % (ref 12–46)
Lymphs Abs: 1.5 10*3/uL (ref 0.7–4.0)
MCH: 26.1 pg (ref 26.0–34.0)
MCV: 81.7 fL (ref 78.0–100.0)
Monocytes Absolute: 0.8 10*3/uL (ref 0.1–1.0)
Neutrophils Relative %: 71 % (ref 43–77)
RBC: 4.36 MIL/uL (ref 3.87–5.11)
RDW: 13.5 % (ref 11.5–15.5)
WBC: 8.2 10*3/uL (ref 4.0–10.5)

## 2013-08-19 LAB — COMPREHENSIVE METABOLIC PANEL
ALT: 15 U/L (ref 0–35)
CO2: 24 mEq/L (ref 19–32)
Calcium: 8.8 mg/dL (ref 8.4–10.5)
Chloride: 102 mEq/L (ref 96–112)
Creatinine, Ser: 0.53 mg/dL (ref 0.50–1.10)
GFR calc Af Amer: >90 mL/min (ref >90)
GFR calc non Af Amer: >90 mL/min (ref >90)
Glucose, Bld: 129 mg/dL — ABNORMAL HIGH (ref 70–99)
Total Bilirubin: 0.4 mg/dL (ref 0.3–1.2)

## 2013-08-19 LAB — POCT PREGNANCY, URINE: Preg Test, Ur: NEGATIVE

## 2013-08-19 MED ORDER — HYDROMORPHONE HCL PF 1 MG/ML IJ SOLN
1.0000 mg | Freq: Once | INTRAMUSCULAR | Status: AC
  Administered 2013-08-19: 1 mg via INTRAVENOUS
  Filled 2013-08-19: qty 1

## 2013-08-19 MED ORDER — HYDROMORPHONE HCL PF 1 MG/ML IJ SOLN: 1.0000 mg | Freq: Once | INTRAMUSCULAR | Status: DC

## 2013-08-19 MED ORDER — ONDANSETRON HCL 4 MG PO TABS: 4.0000 mg | ORAL_TABLET | Freq: Four times a day (QID) | ORAL | Status: DC

## 2013-08-19 MED ORDER — IBUPROFEN 800 MG PO TABS
800.0000 mg | ORAL_TABLET | Freq: Once | ORAL | Status: AC
Start: 2013-08-19 — End: 2013-08-19
  Administered 2013-08-19: 800 mg via ORAL
  Filled 2013-08-19: qty 1

## 2013-08-19 MED ORDER — HYDROMORPHONE HCL PF 1 MG/ML IJ SOLN
INTRAMUSCULAR | Status: AC
  Filled 2013-08-19: qty 1

## 2013-08-19 MED ORDER — ONDANSETRON HCL 4 MG/2ML IJ SOLN
INTRAMUSCULAR | Status: AC
  Administered 2013-08-19: 4 mg via INTRAVENOUS
  Filled 2013-08-19: qty 2

## 2013-08-19 MED ORDER — ONDANSETRON HCL 4 MG/2ML IJ SOLN
4.0000 mg | Freq: Once | INTRAMUSCULAR | Status: AC
  Administered 2013-08-19: 4 mg via INTRAVENOUS

## 2013-08-19 MED ORDER — SODIUM CHLORIDE 0.9 % IV BOLUS (SEPSIS)
1000.0000 mL | Freq: Once | INTRAVENOUS | Status: AC
Start: 2013-08-19 — End: 2013-08-19
  Administered 2013-08-19: 1000 mL via INTRAVENOUS

## 2013-08-19 MED ORDER — IOHEXOL 300 MG/ML  SOLN
80.0000 mL | Freq: Once | INTRAMUSCULAR | Status: AC | PRN
  Administered 2013-08-19: 80 mL via INTRAVENOUS

## 2013-08-19 MED ORDER — IOHEXOL 300 MG/ML  SOLN: 25.0000 mL | INTRAMUSCULAR | Status: AC

## 2013-08-19 MED ORDER — HYDROCODONE-ACETAMINOPHEN 5-325 MG PO TABS: 2.0000 | ORAL_TABLET | ORAL | Status: DC | PRN

## 2013-08-19 NOTE — ED Notes (Addendum)
Seen at [facility] recently for urine sx. See recent urine labs in EPIC including urine cx (with results).

## 2013-08-19 NOTE — ED Notes (Signed)
Pt laying in bed, crying in pain. Reports inability to stand up straight due to the pain.

## 2013-08-19 NOTE — ED Provider Notes (Signed)
CSN: [id number]     Arrival date & time 08/19/13  4098 History   First MD Initiated Contact with Patient 08/19/13 [phone]     Chief Complaint  Patient presents with  . Abdominal Pain   (Consider location/radiation/quality/duration/timing/severity/associated sxs/prior Treatment) HPI  37 year female with history of MS and recent diagnosis of UTI currently treated with Keflex presents complaining of lower pelvic pain. Patient reports acute onset of sharp aching throbbing pain to her low abdomen radiates to the back which started this morning and woke up from sleep. The pain has been severe, colicky, and persistent. Nothing seems to make it better or worse. No reports of fever, chills, nausea, vomiting, diarrhea, dysuria, hematuria, hematochezia melena. No specific treatment tried.  Past Medical History  Diagnosis Date  . MS (multiple sclerosis)   . Bronchitis    Past Surgical History  Procedure Laterality Date  . Cesarean section      x4   Family History  Problem Relation Age of Onset  . Hypertension Mother   . Hypertension Other    History  Substance Use Topics  . Smoking status: Never Smoker   . Smokeless tobacco: Never Used  . Alcohol Use: Yes     Comment: ocassional   OB History   Grav Para Term Preterm Abortions TAB SAB Ect Mult Living                 Review of Systems  All other systems reviewed and are negative.    Allergies  Sulfa antibiotics; Gadolinium derivatives; and Tysabri  Home Medications   Current Outpatient Rx  Name  Route  Sig  Dispense  Refill  . cephALEXin (KEFLEX) 500 MG capsule   Oral   Take 1 capsule (500 mg total) by mouth 3 (three) times daily.   30 capsule   0   . interferon beta-1a (REBIF) 44 MCG/0.5ML injection   Subcutaneous   Inject 44 mcg into the skin 3 (three) times a week. Monday, Wednesday and Friday         . phenazopyridine (PYRIDIUM) 200 MG tablet   Oral   Take 1 tablet (200 mg total) by mouth 3 (three) times daily as  needed for pain.   15 tablet   0    BP 93/68  Pulse 92  Resp 18  Ht 5' 4.5" (1.638 m)  Wt 229 lb (103.874 kg)  BMI 38.71 kg/m2  SpO2 100%  LMP 07/24/2013 Physical Exam  Nursing note and vitals reviewed. Constitutional: She appears well-developed and well-nourished. No distress.  Moderately obese, bending over appears to be in moderate pain  HENT:  Head: Atraumatic.  Eyes: Conjunctivae are normal.  Neck: Neck supple.  Cardiovascular: Normal rate and regular rhythm.   Pulmonary/Chest: Effort normal and breath sounds normal.  Abdominal: Soft. There is tenderness (periumbilical tenderness with guarding.).  Musculoskeletal: She exhibits tenderness (lumbar spine tenderness on palpation.  ).  Neurological: She is alert.  Skin: No rash noted.  Psychiatric: She has a normal mood and affect.    ED Course  Procedures (including critical care time)  Report having low abdominal pain along with low back pain.  Pt uncomfortable on initial exam due to pain.  Requesting pain meds.  Prior to administration of pain med pt had a large bouts of vomit.  Pain medication ordered.  Will reexamine once pt more comfortable.    7:37 AM Patient is more comfortable after receiving pain medication. On reexamination she has tenderness to the periumbilical  region with guarding but without rebound tenderness. No obvious peritoneal sign. Her labs are reassuring and urinalysis is normal, however with the acute onset of pain in her presentation I have discussed with my attending who felt a CT scan is needed to rule out acute pathology. No obvious lower abdominal pain or pelvic pain to suggest ovarian torsion, or TOA.    11:51 AM  CT scan shows there are several borderline dilated small bowel loop in the right abdomen for possible low grade partial small bowel obstruction or ileus. No other acute finding. Patient has had 4 cesarean section in the past. Patient currently is pain control, able to tolerates by mouth  and request to be discharged. Her labs are reassuring.  Her sxs has improved.  I recommend for patient to have strict return precautions if the symptoms worsen.  Labs Review Labs Reviewed  CBC WITH DIFFERENTIAL - Abnormal; Notable for the following:    Hemoglobin 11.4 (*)    HCT 35.6 (*)    All other components within normal limits  COMPREHENSIVE METABOLIC PANEL - Abnormal; Notable for the following:    Glucose, Bld 129 (*)    Total Protein 8.4 (*)    All other components within normal limits  URINALYSIS, ROUTINE W REFLEX MICROSCOPIC  POCT PREGNANCY, URINE   Imaging Review Ct Abdomen Pelvis W Contrast  08/19/2013   CLINICAL DATA:  Bilateral lower abdominal pain radiating to the right flank.  EXAM: CT ABDOMEN AND PELVIS WITH CONTRAST  TECHNIQUE: Multidetector CT imaging of the abdomen and pelvis was performed using the standard protocol following bolus administration of intravenous contrast.  CONTRAST:  80mL OMNIPAQUE IOHEXOL 300 MG/ML  SOLN  COMPARISON:  05/09/2006  FINDINGS: Contrast is diluted, possibly related to body habitus.  Lower axillary lymph nodes are observed with fatty hila, measuring up to 1.4 cm in short axis.  The liver, spleen, pancreas, and adrenal glands appear unremarkable. However, there is a trace amount of free fluid along the posterior inferior margin of the liver. Suspected Phrygian cap in the gallbladder which appears otherwise unremarkable.  Kidneys and proximal ureters unremarkable. No pathologic upper abdominal adenopathy is observed. Trace free fluid in the right pericolic gutter. Terminal ileum unremarkable.  Portions of the appendix are poorly separable from adjacent loops of bowel. A structure believed to represent the appendix on image 34 of series 7 measures 6 mm in the visualized segment in diameter.  Right ovary indistinctly marginated. Left ovary contains a 2 cm lesion with enhancing margins, probably a corpus luteum. Small scattered bilateral inguinal lymph  nodes are present. Prior cesarean section scar noted.  No significant lumbar spine impingement is observed.  There are several borderline dilated loops of small bowel in the right abdomen and, with a transition in caliber at the level of the cesarean section scar.  IMPRESSION: 1. Trace ascites in the right pericolic gutter. 2. Several borderline dilated small bowel loops in the right abdomen, with transition at the cesarean section scar, query low grade/partial small bowel obstruction due to adhesion. Localized ileus could have a similar appearance. 3. A small segment of the appendix has normal diameter, but the appendiceal tip is poorly seen due to surrounding small bowel. 4. Probable corpus luteum in the left ovary. 5. No compelling CT findings of pyelonephritis.   Electronically Signed   By: Herbie Baltimore M.D.   On: 08/19/2013 11:08    EKG Interpretation   None       MDM   1.  Ileus    BP 116/68  Pulse 64  Temp(Src) 97.9 F (36.6 C) (Oral)  Resp 16  Ht 5' 4.5" (1.638 m)  Wt 229 lb (103.874 kg)  BMI 38.71 kg/m2  SpO2 100%  LMP 07/24/2013  I have reviewed nursing notes and vital signs. I personally reviewed the imaging tests through PACS system  I reviewed available ER/hospitalization records thought the EMR     Fayrene Helper, PA-C 08/19/13 1155

## 2013-08-19 NOTE — ED Notes (Signed)
Pt transported and returned from ct.

## 2013-08-19 NOTE — ED Notes (Signed)
Pt vomited large amount. PA Laveda Norman made aware.

## 2013-08-19 NOTE — ED Notes (Signed)
Pt comfortable with d/c and f/u instructions. Prescriptions x2. 

## 2013-08-19 NOTE — ED Notes (Signed)
Bowie Tran, PA at bedside 

## 2013-08-19 NOTE — ED Notes (Addendum)
Pt tearfully reports bilateral lower abdominal pain radiating to her right flank area that woke her up from her sleep this morning associated with nausea, but denies V/D thus far. Denies urinary symptoms currently but reports she was diagnosed was UTI last week and has 4 days left of her Keflex.

## 2013-08-21 NOTE — ED Provider Notes (Signed)
Medical screening examination/treatment/procedure(s) were performed by non-physician practitioner and as supervising physician I was immediately available for consultation/collaboration.    Sunnie Nielsen, MD 08/21/13 2259

## 2013-11-21 ENCOUNTER — Telehealth: Payer: Self-pay | Admitting: Nurse Practitioner

## 2013-11-21 NOTE — Telephone Encounter (Signed)
Pt called in and stated that her [employer] has denied coverage on her Rebif 44 injection. They require information from the doctor provide information stating why it is medically necessary for her to be taking this prescription.  The letter she received that the information can be phoned in, faxed or emailed.  The [employer] phone number is [phone] and the fax number is [phone].  Please call to discuss if necessary.  Thank you

## 2013-11-21 NOTE — Telephone Encounter (Signed)
Patient calling stating that [employer] denied coverage, because needing more information. Please advise

## 2013-11-21 NOTE — Telephone Encounter (Signed)
I contacted ins.  They said they have not denied the medication, but it does require a prior auth.  I have provided all requested info.  Pending response.

## 2014-02-06 ENCOUNTER — Emergency Department (HOSPITAL_COMMUNITY)
Admission: EM | Admit: 2014-02-06 | Discharge: 2014-02-06 | Discharge status: Absent without leave | Payer: Medicare Other | Source: Home / Self Care

## 2014-02-06 ENCOUNTER — Emergency Department (INDEPENDENT_AMBULATORY_CARE_PROVIDER_SITE_OTHER)
Admission: EM | Admit: 2014-02-06 | Discharge: 2014-02-06 | Disposition: A | Discharge status: Home / Self Care | Payer: Medicare Other | Source: Home / Self Care | Attending: Emergency Medicine | Admitting: Emergency Medicine

## 2014-02-06 ENCOUNTER — Emergency Department (HOSPITAL_COMMUNITY)
Admission: EM | Admit: 2014-02-06 | Discharge: 2014-02-06 | Disposition: A | Discharge status: Home / Self Care | Payer: Medicare Other | Source: Home / Self Care

## 2014-02-06 ENCOUNTER — Encounter (HOSPITAL_COMMUNITY): Payer: Self-pay | Admitting: Emergency Medicine

## 2014-02-06 DIAGNOSIS — N760 Acute vaginitis: Secondary | ICD-10-CM | POA: Diagnosis not present

## 2014-02-06 DIAGNOSIS — R300 Dysuria: Secondary | ICD-10-CM

## 2014-02-06 LAB — POCT URINALYSIS DIP (DEVICE)
Bilirubin Urine: NEGATIVE
GLUCOSE, UA: NEGATIVE mg/dL
Hgb urine dipstick: NEGATIVE
KETONES UR: NEGATIVE mg/dL
NITRITE: NEGATIVE
Protein, ur: 30 mg/dL — AB
Specific Gravity, Urine: 1.02 (ref 1.005–1.030)
Urobilinogen, UA: 2 mg/dL — ABNORMAL HIGH (ref 0.0–1.0)
pH: 7.5 (ref 5.0–8.0)

## 2014-02-06 LAB — POCT PREGNANCY, URINE: Preg Test, Ur: NEGATIVE

## 2014-02-06 MED ORDER — CEFUROXIME AXETIL 250 MG PO TABS: 250.0000 mg | ORAL_TABLET | Freq: Two times a day (BID) | ORAL | Status: DC

## 2014-02-06 MED ORDER — METRONIDAZOLE 500 MG PO TABS
500.0000 mg | ORAL_TABLET | Freq: Two times a day (BID) | ORAL | Status: DC
Start: 2014-02-06 — End: 2014-02-17

## 2014-02-06 MED ORDER — VALACYCLOVIR HCL 1 G PO TABS: 1000.0000 mg | ORAL_TABLET | Freq: Three times a day (TID) | ORAL | Status: DC

## 2014-02-06 NOTE — Discharge Instructions (Signed)
Vaginitis Vaginitis is an inflammation of the vagina. It is most often caused by a change in the normal balance of the bacteria and yeast that live in the vagina. This change in balance causes an overgrowth of certain bacteria or yeast, which causes the inflammation. There are different types of vaginitis, but the most common types are:  Bacterial vaginosis.  Yeast infection (candidiasis).  Trichomoniasis vaginitis. This is a sexually transmitted infection (STI).  Viral vaginitis.  Atropic vaginitis.  Allergic vaginitis. CAUSES  The cause depends on the type of vaginitis. Vaginitis can be caused by:  Bacteria (bacterial vaginosis).  Yeast (yeast infection).  A parasite (trichomoniasis vaginitis)  A virus (viral vaginitis).  Low hormone levels (atrophic vaginitis). Low hormone levels can occur during pregnancy, breastfeeding, or after menopause.  Irritants, such as bubble baths, scented tampons, and feminine sprays (allergic vaginitis). Other factors can change the normal balance of the yeast and bacteria that live in the vagina. These include:  Antibiotic medicines.  Poor hygiene.  Diaphragms, vaginal sponges, spermicides, birth control pills, and intrauterine devices (IUD).  Sexual intercourse.  Infection.  Uncontrolled diabetes.  A weakened immune system. SYMPTOMS  Symptoms can vary depending on the cause of the vaginitis. Common symptoms include:  Abnormal vaginal discharge.  The discharge is white, gray, or yellow with bacterial vaginosis.  The discharge is thick, white, and cheesy with a yeast infection.  The discharge is frothy and yellow or greenish with trichomoniasis.  A bad vaginal odor.  The odor is fishy with bacterial vaginosis.  Vaginal itching, pain, or swelling.  Painful intercourse.  Pain or burning when urinating. Sometimes, there are no symptoms. TREATMENT  Treatment will vary depending on the type of infection.   Bacterial  vaginosis and trichomoniasis are often treated with antibiotic creams or pills.  Yeast infections are often treated with antifungal medicines, such as vaginal creams or suppositories.  Viral vaginitis has no cure, but symptoms can be treated with medicines that relieve discomfort. Your sexual partner should be treated as well.  Atrophic vaginitis may be treated with an estrogen cream, pill, suppository, or vaginal ring. If vaginal dryness occurs, lubricants and moisturizing creams may help. You may be told to avoid scented soaps, sprays, or douches.  Allergic vaginitis treatment involves quitting the use of the product that is causing the problem. Vaginal creams can be used to treat the symptoms. HOME CARE INSTRUCTIONS   Take all medicines as directed by your caregiver.  Keep your genital area clean and dry. Avoid soap and only rinse the area with water.  Avoid douching. It can remove the healthy bacteria in the vagina.  Do not use tampons or have sexual intercourse until your vaginitis has been treated. Use sanitary pads while you have vaginitis.  Wipe from front to back. This avoids the spread of bacteria from the rectum to the vagina.  Let air reach your genital area.  Wear cotton underwear to decrease moisture buildup.  Avoid wearing underwear while you sleep until your vaginitis is gone.  Avoid tight pants and underwear or nylons without a cotton panel.  Take off wet clothing (especially bathing suits) as soon as possible.  Use mild, non-scented products. Avoid using irritants, such as:  Scented feminine sprays.  Fabric softeners.  Scented detergents.  Scented tampons.  Scented soaps or bubble baths.  Practice safe sex and use condoms. Condoms may prevent the spread of trichomoniasis and viral vaginitis. SEEK MEDICAL CARE IF:   You have abdominal pain.  You   have a fever or persistent symptoms for more than 2 3 days.  You have a fever and your symptoms suddenly  get worse. Document Released: 06/25/2007 Document Revised: 05/22/2012 Document Reviewed: 02/08/2012 [employer] Patient Information 2014 [employer].  

## 2014-02-06 NOTE — ED Notes (Signed)
C/o UTI and genital wart

## 2014-02-06 NOTE — ED Provider Notes (Addendum)
CSN: [id number]     Arrival date & time 02/06/14  1627 History   First MD Initiated Contact with Patient 02/06/14 1733     Chief Complaint  Patient presents with  . Urinary Tract Infection  . Genital Warts   (Consider location/radiation/quality/duration/timing/severity/associated sxs/prior Treatment) HPI Comments: 38 year old female presents for evaluation of very painful area on her vagina that she believes may be a genital wart as well as a possible urinary tract infection. She has had dysuria, urinary frequency, urinary urgency for about 3 days. No abdominal pain, flank pain, nausea, vomiting, fever. She also has a painful lump in the area between her vagina and direct him. This is swollen and very raw. She has been seen for this before and was told that it is a wart, she was given some cream to put it and it seemed to get better. However, she has return of her symptoms  For the past week. She also admits to a slight vaginal discharge that is malodorous.  Patient is a 38 y.o. female presenting with urinary tract infection.  Urinary Tract Infection Pertinent negatives include no abdominal pain.    Past Medical History  Diagnosis Date  . MS (multiple sclerosis)   . Bronchitis    Past Surgical History  Procedure Laterality Date  . Cesarean section      x4   Family History  Problem Relation Age of Onset  . Hypertension Mother   . Hypertension Other    History  Substance Use Topics  . Smoking status: Never Smoker   . Smokeless tobacco: Never Used  . Alcohol Use: Yes     Comment: ocassional   OB History   Grav Para Term Preterm Abortions TAB SAB Ect Mult Living                 Review of Systems  Constitutional: Negative for fever.  Gastrointestinal: Negative for nausea, vomiting, abdominal pain and diarrhea.  Genitourinary: Positive for dysuria, urgency, frequency, vaginal discharge, genital sores and vaginal pain.  All other systems reviewed and are  negative.   Allergies  Sulfa antibiotics; Gadolinium derivatives; and Tysabri  Home Medications   Prior to Admission medications   Medication Sig Start Date End Date Taking? Authorizing Provider  cefUROXime (CEFTIN) 250 MG tablet Take 1 tablet (250 mg total) by mouth 2 (two) times daily with a meal. 02/06/14   Graylon Good, PA-C  cephALEXin (KEFLEX) 500 MG capsule Take 1 capsule (500 mg total) by mouth 3 (three) times daily. 08/12/13   Reuben Likes, MD  HYDROcodone-acetaminophen (NORCO/VICODIN) 5-325 MG per tablet Take 2 tablets by mouth every 4 (four) hours as needed. 08/19/13   Fayrene Helper, PA-C  interferon beta-1a (REBIF) 44 MCG/0.5ML injection Inject 44 mcg into the skin 3 (three) times a week. Monday, Wednesday and Friday    Historical Provider, MD  metroNIDAZOLE (FLAGYL) 500 MG tablet Take 1 tablet (500 mg total) by mouth 2 (two) times daily. 02/06/14   Adrian Blackwater Humphrey Guerreiro, PA-C  ondansetron (ZOFRAN) 4 MG tablet Take 1 tablet (4 mg total) by mouth every 6 (six) hours. 08/19/13   Fayrene Helper, PA-C  phenazopyridine (PYRIDIUM) 200 MG tablet Take 1 tablet (200 mg total) by mouth 3 (three) times daily as needed for pain. 08/12/13   Reuben Likes, MD  valACYclovir (VALTREX) 1000 MG tablet Take 1 tablet (1,000 mg total) by mouth 3 (three) times daily. 02/06/14 02/20/14  Adrian Blackwater Wanita Derenzo, PA-C   BP 141/84  Pulse  82  Temp(Src) 98.4 F (36.9 C) (Oral)  Resp 16  SpO2 100%  LMP 01/20/2014 Physical Exam  Nursing note and vitals reviewed. Constitutional: She is oriented to person, place, and time. Vital signs are normal. She appears well-developed and well-nourished. No distress.  HENT:  Head: Normocephalic and atraumatic.  Pulmonary/Chest: Effort normal. No respiratory distress.  Abdominal: Soft. There is no tenderness. There is no guarding.  Genitourinary:    Vaginal discharge (malodorous, fishy smelling) found.  Deferred pelvic exam due to patient pain and herpetic appearance of the external  lesions.  There is also an overwhelming fishy odor when examining the vagina  Neurological: She is alert and oriented to person, place, and time. She has normal strength. Coordination normal.  Skin: Skin is warm and dry. No rash noted. She is not diaphoretic.  Psychiatric: She has a normal mood and affect. Judgment normal.    ED Course  Procedures (including critical care time) Labs Review Labs Reviewed  POCT URINALYSIS DIP (DEVICE) - Abnormal; Notable for the following:    Protein, ur 30 (*)    Urobilinogen, UA 2.0 (*)    Leukocytes, UA TRACE (*)    All other components within normal limits  URINE CULTURE  HERPES SIMPLEX VIRUS CULTURE  POCT PREGNANCY, URINE    Imaging Review No results found.   MDM   1. Vaginitis   2. Dysuria    HSV culture sent. Will treat empirically for BV and and for herpes, also for UTI. Urine culture sent. Followup with primary care if no improvement in one week.   Meds ordered this encounter  Medications  . valACYclovir (VALTREX) 1000 MG tablet    Sig: Take 1 tablet (1,000 mg total) by mouth 3 (three) times daily.    Dispense:  21 tablet    Refill:  0    Order Specific Question:  Supervising Provider    Answer:  Linna HoffKINDL, JAMES D [phone][[id number]]  . metroNIDAZOLE (FLAGYL) 500 MG tablet    Sig: Take 1 tablet (500 mg total) by mouth 2 (two) times daily.    Dispense:  14 tablet    Refill:  0    Order Specific Question:  Supervising Provider    Answer:  Linna HoffKINDL, JAMES D [phone][[id number]]  . cefUROXime (CEFTIN) 250 MG tablet    Sig: Take 1 tablet (250 mg total) by mouth 2 (two) times daily with a meal.    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  Called pt and discussed results with her.  No further action needed.    Graylon GoodZachary H Adrean Heitz, PA-C 02/09/14 2022

## 2014-02-06 NOTE — ED Notes (Signed)
No answer x 3 -pt. Told registration she needed to go the bank before it closes will come back.

## 2014-02-07 LAB — URINE CULTURE

## 2014-02-07 NOTE — ED Provider Notes (Signed)
Medical screening examination/treatment/procedure(s) were performed by non-physician practitioner and as supervising physician I was immediately available for consultation/collaboration.  Chera Slivka, M.D.  Annabella Elford C Carnell Beavers, MD 02/07/14 0838 

## 2014-02-09 LAB — HERPES SIMPLEX VIRUS CULTURE: Culture: DETECTED

## 2014-02-09 NOTE — ED Notes (Addendum)
Call back from patient, asking about her herpes test report. After verifying ID, discussed we do not have final report, typically takes aprox 1 week. Patient will call back at end of week. Patient notified of report , will practice safer sex, use medication as rx and notify partners

## 2014-02-10 NOTE — ED Provider Notes (Signed)
Medical screening examination/treatment/procedure(s) were performed by non-physician practitioner and as supervising physician I was immediately available for consultation/collaboration.  Stevie Charter, M.D.  Zayneb Baucum C Omer Monter, MD 02/10/14 2224 

## 2014-02-13 ENCOUNTER — Other Ambulatory Visit: Payer: Self-pay | Admitting: Neurology

## 2014-02-17 ENCOUNTER — Encounter: Payer: Self-pay | Admitting: Nurse Practitioner

## 2014-02-17 ENCOUNTER — Ambulatory Visit (INDEPENDENT_AMBULATORY_CARE_PROVIDER_SITE_OTHER): Payer: Medicare Other | Admitting: Nurse Practitioner

## 2014-02-17 VITALS — BP 139/72 | HR 78 | Ht 64.5 in | Wt 236.0 lb

## 2014-02-17 DIAGNOSIS — Z5181 Encounter for therapeutic drug level monitoring: Secondary | ICD-10-CM

## 2014-02-17 DIAGNOSIS — G35 Multiple sclerosis: Secondary | ICD-10-CM

## 2014-02-17 NOTE — Progress Notes (Signed)
I have read the note, and I agree with the clinical assessment and plan.  Charles K Willis   

## 2014-02-17 NOTE — Patient Instructions (Signed)
Continue Rebif 3 times a week Labs today Followup in 6 months

## 2014-02-17 NOTE — Progress Notes (Signed)
[facility]  PATIENT: Teresa Arnold DOB: 07/28/1976   REASON FOR VISIT: follow up for MS   HISTORY OF PRESENT ILLNESS: [name], 38 year old female returns for followup. She was last seen in this office 08/15/2013. She has a history of relapsing remitting multiple sclerosis. She had an allergic reaction to Tysabri in the past. She is currently on Rebif 3 times a week. She has a urinary tract infection when last seen she had some increased weakness in her lower extremities due to this, pseudo-exacerbation. She completed her antibiotics and has not had any further issues with UTIs. She recently had diagnosis of herpes . She has not fallen, she denies any visual problems. She has not had any injection site abnormalities. Last MRI of the brain was in 2012, she had a reaction to contrast. She returns for reevaluation   HISTORY: of multiple sclerosis relapsing remitting. She was on Tysabri at one time but had an allergy to the medication. She was then started on Rebif 3 times a week. She denies injection site problems, last exacerbation occurred in September of last year.She denies spasms, focal weakness, sensory changes, visual changes, speech or swallowing problems, no problems with bowel or bladder function. MRI scan of the brain 03/2011 shows multiple subcortical, periventricular, brainstem, cerebellar and upper cervical cord white matter hyperintensities quite typical for demyelinating disease. Enhancing lesions are noted in the right occipital and left parietal periventricular regions. The presence of T1 black holes indicates chronic disease. No significant atrophy is seen. MRI of the cervical spine with remote age demyelinating plaques at C2-C3 and lower brainstem and left cerebellum. No enhancing lesions. She has no new complaints today. She needs labs drawn      REVIEW OF SYSTEMS: Full 14 system review of systems performed and notable only for those listed, all  others are neg:  Constitutional: N/A  Cardiovascular: N/A  Ear/Nose/Throat: N/A  Skin: N/A  Eyes: N/A  Respiratory: N/A  Gastroitestinal: N/A  Hematology/Lymphatic: Easy bruising, anemia  Endocrine: Flushing Musculoskeletal:N/A  Allergy/Immunology: N/A  Neurological: N/A Psychiatric: N/A Sleep : NA   ALLERGIES: Allergies  Allergen Reactions  . Sulfa Antibiotics   . Gadolinium Derivatives Nausea And Vomiting    Pt only received half dose of 10ml before getting sick.   Teresa Arnold. Tysabri [Natalizumab] Rash    HOME MEDICATIONS: Outpatient Prescriptions Prior to Visit  Medication Sig Dispense Refill  . HYDROcodone-acetaminophen (NORCO/VICODIN) 5-325 MG per tablet Take 2 tablets by mouth every 4 (four) hours as needed.  6 tablet  0  . interferon beta-1a (REBIF) 44 MCG/0.5ML injection Inject 44 mcg into the skin 3 (three) times a week. Monday, Wednesday and Friday      . REBIF REBIDOSE 44 MCG/0.5ML injection INJECT 1 PREFILLED SYRINGE SUBCUTANEOUSLY 3 TIMES PER WEEK  6 mL  3  . cefUROXime (CEFTIN) 250 MG tablet Take 1 tablet (250 mg total) by mouth 2 (two) times daily with a meal.  10 tablet  0  . cephALEXin (KEFLEX) 500 MG capsule Take 1 capsule (500 mg total) by mouth 3 (three) times daily.  30 capsule  0  . metroNIDAZOLE (FLAGYL) 500 MG tablet Take 1 tablet (500 mg total) by mouth 2 (two) times daily.  14 tablet  0  . ondansetron (ZOFRAN) 4 MG tablet Take 1 tablet (4 mg total) by mouth every 6 (six) hours.  12 tablet  0  . phenazopyridine (PYRIDIUM) 200 MG tablet Take 1 tablet (200 mg total) by mouth 3 (three)  times daily as needed for pain.  15 tablet  0  . valACYclovir (VALTREX) 1000 MG tablet Take 1 tablet (1,000 mg total) by mouth 3 (three) times daily.  21 tablet  0   No facility-administered medications prior to visit.    PAST MEDICAL HISTORY: Past Medical History  Diagnosis Date  . MS (multiple sclerosis)   . Bronchitis     PAST SURGICAL HISTORY: Past Surgical History    Procedure Laterality Date  . Cesarean section      x4    FAMILY HISTORY: Family History  Problem Relation Age of Onset  . Hypertension Mother   . Hypertension Other     SOCIAL HISTORY: History   Social History  . Marital Status: Single    Spouse Name: N/A    Number of Children: 4  . Years of Education: 14   Occupational History  . Not on file.   Social History Main Topics  . Smoking status: Never Smoker   . Smokeless tobacco: Never Used  . Alcohol Use: Yes     Comment: ocassional  . Drug Use: No  . Sexual Activity: Not on file   Other Topics Concern  . Not on file   Social History Narrative   Patient is single and lives with her family.   Patient has four children.   Patient has a college education.   Patient is right-handed.   Patient does not drink any caffeine.     PHYSICAL EXAM  Filed Vitals:   02/17/14 1126  BP: 139/72  Pulse: 78  Height: 5' 4.5" (1.638 m)  Weight: 236 lb (107.049 kg)   Body mass index is 39.9 kg/(m^2).  Generalized: Well developed, morbidly obese female in no acute distress  Head: normocephalic and atraumatic,. Oropharynx benign  Neck: Supple, no carotid bruits  Cardiac: Regular rate rhythm, no murmur  Musculoskeletal: No deformity   Neurological examination   Mentation: Alert oriented to time, place, history taking. Follows all commands speech and language fluent  Cranial nerve II-XII: Visual acuity 20/40 right, 20/50 left . Fundoscopic exam reveals sharp disc margins.Pupils were equal round reactive to light extraocular movements were full, visual field were full on confrontational test. Facial sensation and strength were normal. hearing was intact to finger rubbing bilaterally. Uvula tongue midline. head turning and shoulder shrug were normal and symmetric.Tongue protrusion into cheek strength was normal. Motor: normal bulk and tone, full strength in the BUE, BLE, fine finger movements normal, no pronator drift. No focal  weakness Sensory: normal and symmetric to light touch, pinprick, in lower extremities decreased vibratory to left foot, normal on the right   Coordination: finger-nose-finger, heel-to-shin bilaterally, no dysmetria Reflexes: Brachioradialis 2/2, biceps 2/2, triceps 2/2, patellar 2/2, Achilles 2/2, plantar responses were flexor bilaterally. Gait and Station: Rising up from seated position without assistance, normal stance,  moderate stride, good arm swing, smooth turning, able to perform tiptoe, and heel walking without difficulty. Tandem gait is steady  DIAGNOSTIC DATA (LABS, IMAGING, TESTING) - I reviewed patient records, labs, notes, testing and imaging myself where available.  Lab Results  Component Value Date   WBC 8.2 08/19/2013   HGB 11.4* 08/19/2013   HCT 35.6* 08/19/2013   MCV 81.7 08/19/2013   PLT 311 08/19/2013      Component Value Date/Time   NA 137 08/19/2013 0550   NA 138 08/15/2013 1141   K 3.8 08/19/2013 0550   CL 102 08/19/2013 0550   CO2 24 08/19/2013 0550   GLUCOSE  129* 08/19/2013 0550   GLUCOSE 81 08/15/2013 1141   BUN 16 08/19/2013 0550   BUN 13 08/15/2013 1141   CREATININE 0.53 08/19/2013 0550   CALCIUM 8.8 08/19/2013 0550   PROT 8.4* 08/19/2013 0550   PROT 7.9 08/15/2013 1141   ALBUMIN 3.6 08/19/2013 0550   AST 20 08/19/2013 0550   ALT 15 08/19/2013 0550   ALKPHOS 74 08/19/2013 0550   BILITOT 0.4 08/19/2013 0550   GFRNONAA >90 08/19/2013 0550   GFRAA >90 08/19/2013 0550       ASSESSMENT AND PLAN  38 y.o. year old female  has a past medical history of MS (multiple sclerosis) here to followup. She has not had exacerbation of symptoms since last seen  Continue Rebif 3 times a week Labs today MRI of the brain without contrast/compare to 2012 Followup in 6 months Nilda Riggs, [facility], [facility], APRN  [facility] [address] [place], [place] [address] [phone]

## 2014-02-27 ENCOUNTER — Ambulatory Visit
Admission: RE | Admit: 2014-02-27 | Discharge: 2014-02-27 | Disposition: A | Discharge status: Home / Self Care | Payer: Medicare Other | Source: Ambulatory Visit | Attending: Nurse Practitioner | Admitting: Nurse Practitioner

## 2014-02-27 DIAGNOSIS — G35 Multiple sclerosis: Secondary | ICD-10-CM

## 2014-03-04 ENCOUNTER — Telehealth: Payer: Self-pay | Admitting: *Deleted

## 2014-03-04 NOTE — Telephone Encounter (Signed)
Message copied by Ardeth Sportsman on Wed Mar 04, 2014  8:32 AM ------      Message from: Beverely Low      Created: Mon Mar 02, 2014  2:41 PM       MRI of the brain with chronic disease, nothing acute. Please call patient.             ----- Message -----         From: Suanne Marker, MD         Sent: 02/27/2014   5:31 PM           To: Nilda Riggs, NP                   ------

## 2014-03-04 NOTE — Telephone Encounter (Signed)
Called patient and left a message about her MRI results. Patient is to call the office with questions.

## 2014-03-10 ENCOUNTER — Telehealth: Payer: Self-pay | Admitting: Nurse Practitioner

## 2014-03-10 ENCOUNTER — Other Ambulatory Visit (INDEPENDENT_AMBULATORY_CARE_PROVIDER_SITE_OTHER): Payer: Self-pay

## 2014-03-10 DIAGNOSIS — G35 Multiple sclerosis: Secondary | ICD-10-CM | POA: Diagnosis not present

## 2014-03-10 DIAGNOSIS — Z5181 Encounter for therapeutic drug level monitoring: Secondary | ICD-10-CM | POA: Diagnosis not present

## 2014-03-10 DIAGNOSIS — Z0289 Encounter for other administrative examinations: Secondary | ICD-10-CM

## 2014-03-10 LAB — COMPREHENSIVE METABOLIC PANEL
ALT: 11 IU/L (ref 0–32)
AST: 15 IU/L (ref 0–40)
Albumin/Globulin Ratio: 1.1 (ref 1.1–2.5)
Albumin: 3.6 g/dL (ref 3.5–5.5)
Alkaline Phosphatase: 76 IU/L (ref 39–117)
BUN/Creatinine Ratio: 24 — ABNORMAL HIGH (ref 8–20)
BUN: 15 mg/dL (ref 6–20)
CALCIUM: 8.9 mg/dL (ref 8.7–10.2)
CO2: 25 mmol/L (ref 18–29)
CREATININE: 0.63 mg/dL (ref 0.57–1.00)
Chloride: 105 mmol/L (ref 96–108)
GFR calc non Af Amer: 117 mL/min/{1.73_m2} (ref >59)
GFR, EST AFRICAN AMERICAN: 134 mL/min/{1.73_m2} (ref >59)
GLOBULIN, TOTAL: 3.4 g/dL (ref 1.5–4.5)
Glucose: 81 mg/dL (ref 65–99)
Potassium: 4 mmol/L (ref 3.5–5.2)
Sodium: 137 mmol/L (ref 134–144)
Total Protein: 7 g/dL (ref 6.0–8.5)

## 2014-03-10 LAB — CBC WITH DIFFERENTIAL/PLATELET
BASOS ABS: 0 10*3/uL (ref 0.0–0.2)
Basos: 0 %
EOS: 2 %
Eosinophils Absolute: 0.1 10*3/uL (ref 0.0–0.4)
HEMATOCRIT: 32.9 % — AB (ref 34.0–46.6)
HEMOGLOBIN: 11.1 g/dL (ref 11.1–15.9)
Lymphocytes Absolute: 2.1 10*3/uL (ref 0.7–3.1)
Lymphs: 39 %
MCH: 25.9 pg — ABNORMAL LOW (ref 26.6–33.0)
MCHC: 33.7 g/dL (ref 31.5–35.7)
MCV: 77 fL — ABNORMAL LOW (ref 79–97)
MONOCYTES: 14 %
Monocytes Absolute: 0.8 10*3/uL (ref 0.1–0.9)
NEUTROS ABS: 2.4 10*3/uL (ref 1.4–7.0)
Neutrophils Relative %: 45 %
RBC: 4.28 x10E6/uL (ref 3.77–5.28)
RDW: 12.9 % (ref 12.3–15.4)
WBC: 5.4 10*3/uL (ref 3.4–10.8)

## 2014-03-10 NOTE — Telephone Encounter (Signed)
I do not see that patient had her labs that were ordered. Please obtain or if done at another lab please let us know so the results can be obtained. Please call patient

## 2014-03-10 NOTE — Telephone Encounter (Signed)
Called pt to inform her of the lab work that was ordered for her on 02/17/14 and pt stated that she has not has that done yet and that she would be in today to have it drawn. I advised the pt that if she has any other problems, questions or concerns to call the office. Pt verbalized understanding. FYI

## 2014-03-10 NOTE — Telephone Encounter (Signed)
noted 

## 2014-03-11 NOTE — Progress Notes (Signed)
Quick Note:  I called pt and relayed the lab results (looked good). She verbalized understanding. ______

## 2014-04-01 DIAGNOSIS — A6004 Herpesviral vulvovaginitis: Secondary | ICD-10-CM | POA: Diagnosis not present

## 2014-05-04 ENCOUNTER — Encounter: Payer: Self-pay | Admitting: Neurology

## 2014-06-11 ENCOUNTER — Other Ambulatory Visit: Payer: Self-pay

## 2014-06-11 MED ORDER — INTERFERON BETA-1A 44 MCG/0.5ML ~~LOC~~ SOLN: SUBCUTANEOUS | Status: DC

## 2014-07-23 ENCOUNTER — Telehealth: Payer: Self-pay | Admitting: Neurology

## 2014-07-23 NOTE — Telephone Encounter (Signed)
Left message for patient regarding rescheduling 08/19/14 per Larita Fife leaving, rescheduled to Dr. Anne Hahn next available on 11/24/14.

## 2014-08-19 ENCOUNTER — Ambulatory Visit: Payer: Self-pay | Admitting: Nurse Practitioner

## 2014-08-25 ENCOUNTER — Inpatient Hospital Stay (HOSPITAL_COMMUNITY)
Admission: AD | Admit: 2014-08-25 | Discharge: 2014-08-25 | Disposition: A | Discharge status: Home / Self Care | Payer: Medicare Other | Source: Ambulatory Visit | Attending: Obstetrics | Admitting: Obstetrics

## 2014-08-25 ENCOUNTER — Encounter (HOSPITAL_COMMUNITY): Payer: Self-pay | Admitting: *Deleted

## 2014-08-25 DIAGNOSIS — Z3202 Encounter for pregnancy test, result negative: Secondary | ICD-10-CM | POA: Diagnosis not present

## 2014-08-25 DIAGNOSIS — N926 Irregular menstruation, unspecified: Secondary | ICD-10-CM | POA: Diagnosis not present

## 2014-08-25 DIAGNOSIS — N96 Recurrent pregnancy loss: Secondary | ICD-10-CM | POA: Insufficient documentation

## 2014-08-25 DIAGNOSIS — N939 Abnormal uterine and vaginal bleeding, unspecified: Secondary | ICD-10-CM | POA: Diagnosis not present

## 2014-08-25 DIAGNOSIS — N9489 Other specified conditions associated with female genital organs and menstrual cycle: Secondary | ICD-10-CM | POA: Diagnosis not present

## 2014-08-25 LAB — URINALYSIS, ROUTINE W REFLEX MICROSCOPIC
BILIRUBIN URINE: NEGATIVE
Glucose, UA: NEGATIVE mg/dL
Ketones, ur: NEGATIVE mg/dL
Leukocytes, UA: NEGATIVE
Nitrite: NEGATIVE
PH: 7 (ref 5.0–8.0)
Protein, ur: NEGATIVE mg/dL
SPECIFIC GRAVITY, URINE: 1.015 (ref 1.005–1.030)
Urobilinogen, UA: 1 mg/dL (ref 0.0–1.0)

## 2014-08-25 LAB — CBC
HCT: 34.6 % — ABNORMAL LOW (ref 36.0–46.0)
Hemoglobin: 10.9 g/dL — ABNORMAL LOW (ref 12.0–15.0)
MCH: 26 pg (ref 26.0–34.0)
MCHC: 31.5 g/dL (ref 30.0–36.0)
MCV: 82.4 fL (ref 78.0–100.0)
Platelets: 268 10*3/uL (ref 150–400)
RBC: 4.2 MIL/uL (ref 3.87–5.11)
RDW: 13.4 % (ref 11.5–15.5)
WBC: 5.8 10*3/uL (ref 4.0–10.5)

## 2014-08-25 LAB — HCG, QUANTITATIVE, PREGNANCY

## 2014-08-25 LAB — URINE MICROSCOPIC-ADD ON

## 2014-08-25 LAB — POCT PREGNANCY, URINE: Preg Test, Ur: NEGATIVE

## 2014-08-25 NOTE — MAU Note (Signed)
Started spotting then began to be heavier. States she did not do a UPT at home, but states she "kind of knows" when she is pregnant. And she was not due to start her period yet. States she went to the clinic downstairs and was told she would need to be seen here for possible blood work. States she has MS and is concerned about the effects of her medication on a possible pregnancy.

## 2014-08-25 NOTE — Discharge Instructions (Signed)
Abnormal Uterine Bleeding Abnormal uterine bleeding can affect women at various stages in life, including teenagers, women in their reproductive years, pregnant women, and women who have reached menopause. Several kinds of uterine bleeding are considered abnormal, including:  Bleeding or spotting between periods.   Bleeding after sexual intercourse.   Bleeding that is heavier or more than normal.   Periods that last longer than usual.  Bleeding after menopause.  Many cases of abnormal uterine bleeding are minor and simple to treat, while others are more serious. Any type of abnormal bleeding should be evaluated by your health care provider. Treatment will depend on the cause of the bleeding. HOME CARE INSTRUCTIONS Monitor your condition for any changes. The following actions may help to alleviate any discomfort you are experiencing:  Avoid the use of tampons and douches as directed by your health care provider.  Change your pads frequently. You should get regular pelvic exams and Pap tests. Keep all follow-up appointments for diagnostic tests as directed by your health care provider.  SEEK MEDICAL CARE IF:   Your bleeding lasts more than 1 week.   You feel dizzy at times.  SEEK IMMEDIATE MEDICAL CARE IF:   You pass out.   You are changing pads every 15 to 30 minutes.   You have abdominal pain.  You have a fever.   You become sweaty or weak.   You are passing large blood clots from the vagina.   You start to feel nauseous and vomit. MAKE SURE YOU:   Understand these instructions.  Will watch your condition.  Will get help right away if you are not doing well or get worse. Document Released: 08/28/2005 Document Revised: 09/02/2013 Document Reviewed: 03/27/2013 ExitCare Patient Information 2015 [employer]. This information is not intended to replace advice given to you by your health care provider. Make sure you discuss any questions you have with your  health care provider.  

## 2014-08-25 NOTE — MAU Provider Note (Signed)
Chief Complaint: Vaginal Bleeding   First Provider Initiated Contact with Patient 08/25/14 1646     SUBJECTIVE HPI: Teresa Arnold is a 38 y.o. [id number] at Unknown by LMP who presents with vaginal bleeding onset 12/14pm. Pt reports heavy vaginal bleeding overnight, soaking a tampon in 6h. Pt reports LMP 11/27, believes herself to be "I was about 10 days pregnant". Denies home preg test or any preg confirmation. Pt reports cramping lower pelvic pain. Pt reports pain similar to previous miscarriages, cramping aching pain. Denies concern for sti/std exposure, reports in a monogamous relationship. Denies dysuria, hematuria, n/v, fever/chills, vaginal discharge.  Pt sent from clinic for bHCG eval possible pregnancy when Upreg negative.    Past Medical History  Diagnosis Date  . MS (multiple sclerosis)   . Bronchitis    OB History  Gravida Para Term Preterm AB SAB TAB Ectopic Multiple Living  5 4 4  1 1    4     # Outcome Date GA Lbr Len/2nd Weight Sex Delivery Anes PTL Lv  5 Term           4 Term           3 Term           2 Term           1 SAB              Past Surgical History  Procedure Laterality Date  . Cesarean section      x4   History   Social History  . Marital Status: Single    Spouse Name: N/A    Number of Children: 4  . Years of Education: 14   Occupational History  . Not on file.   Social History Main Topics  . Smoking status: Never Smoker   . Smokeless tobacco: Never Used  . Alcohol Use: Yes     Comment: ocassional  . Drug Use: No  . Sexual Activity: Yes    Birth Control/ Protection: None   Other Topics Concern  . Not on file   Social History Narrative   Patient is single and lives with her family.   Patient has four children.   Patient has a college education.   Patient is right-handed.   Patient does not drink any caffeine.   No current facility-administered medications on file prior to encounter.   Current Outpatient Prescriptions on File  Prior to Encounter  Medication Sig Dispense Refill  . HYDROcodone-acetaminophen (NORCO/VICODIN) 5-325 MG per tablet Take 2 tablets by mouth every 4 (four) hours as needed. 6 tablet 0  . interferon beta-1a (REBIF REBIDOSE) 44 MCG/0.5ML injection INJECT 1 PREFILLED SYRINGE SUBCUTANEOUSLY 3 TIMES PER WEEK 6 mL 3   Allergies  Allergen Reactions  . Sulfa Antibiotics   . Gadolinium Derivatives Nausea And Vomiting    Pt only received half dose of 10ml before getting sick.   [name]. Tysabri [Natalizumab] Rash    ROS: Pertinent items in HPI  OBJECTIVE Blood pressure 155/94, pulse 77, temperature 98 F (36.7 C), temperature source Oral, resp. rate 20, last menstrual period 08/05/2014. GENERAL: Well-developed, well-nourished obese female in no acute distress.  HEENT: Normocephalic HEART: normal rate, heart tones wnl RESP: normal effort, lungs clear bil ABDOMEN: Soft, non-tender, + bowel sounds all quad, no organomegaly or masses. Abd nontender to palpate throughout EXTREMITIES: Nontender, no edema NEURO: Alert and oriented SPECULUM EXAM: Offered, declined  LAB RESULTS Results for orders placed or performed during the hospital encounter  of 08/25/14 (from the past 24 hour(s))  Urinalysis, Routine w reflex microscopic     Status: Abnormal   Collection Time: 08/25/14  3:48 PM  Result Value Ref Range   Color, Urine YELLOW YELLOW   APPearance CLEAR CLEAR   Specific Gravity, Urine 1.015 1.005 - 1.030   pH 7.0 5.0 - 8.0   Glucose, UA NEGATIVE NEGATIVE mg/dL   Hgb urine dipstick MODERATE (A) NEGATIVE   Bilirubin Urine NEGATIVE NEGATIVE   Ketones, ur NEGATIVE NEGATIVE mg/dL   Protein, ur NEGATIVE NEGATIVE mg/dL   Urobilinogen, UA 1.0 0.0 - 1.0 mg/dL   Nitrite NEGATIVE NEGATIVE   Leukocytes, UA NEGATIVE NEGATIVE  Urine microscopic-add on     Status: Abnormal   Collection Time: 08/25/14  3:48 PM  Result Value Ref Range   Squamous Epithelial / LPF RARE RARE   WBC, UA 0-2 <3 WBC/hpf   RBC / HPF  7-10 <3 RBC/hpf   Bacteria, UA FEW (A) RARE  Pregnancy, urine POC     Status: None   Collection Time: 08/25/14  3:53 PM  Result Value Ref Range   Preg Test, Ur NEGATIVE NEGATIVE  hCG, quantitative, pregnancy     Status: None   Collection Time: 08/25/14  4:55 PM  Result Value Ref Range   hCG, Beta Chain, Quant, S <1 <5 mIU/mL  CBC     Status: Abnormal   Collection Time: 08/25/14  4:55 PM  Result Value Ref Range   WBC 5.8 4.0 - 10.5 K/uL   RBC 4.20 3.87 - 5.11 MIL/uL   Hemoglobin 10.9 (L) 12.0 - 15.0 g/dL   HCT 40.9 (L) 81.1 - 91.4 %   MCV 82.4 78.0 - 100.0 fL   MCH 26.0 26.0 - 34.0 pg   MCHC 31.5 30.0 - 36.0 g/dL   RDW 78.2 95.6 - 21.3 %   Platelets 268 150 - 400 K/uL    IMAGING No results found.  MAU COURSE Hcg and CBC to eval possible pregnancy or infection.   ASSESSMENT 1. Abnormal menstrual cycle   2. Negative pregnancy test    PLAN Discussed menstrual cycle and irregular menses, educated on cramping abd pain and lab results r/o UTI and infection. Discussed hcg and f/u with primary MD. Discharge home UPT and Quant HCG both neg. Discussed findings not C/W recent SAB or SAB in progress. Most likely abnormal menses. Extensive work-up not needed for first episode. F/U w/ Gyn if problem continues.     Follow-up Information    Follow up with Gynecologist.   Why:  As needed if symptoms worsen      Follow up with [facility] [place] [facility].   Why:  As needed in emergencies   Contact information:   [address] [id number] mc [place] [address] [phone]        Medication List    TAKE these medications        HYDROcodone-acetaminophen 5-325 MG per tablet  Commonly known as:  NORCO/VICODIN  Take 2 tablets by mouth every 4 (four) hours as needed.     interferon beta-1a 44 MCG/0.5ML injection  Commonly known as:  REBIF REBIDOSE  INJECT 1 PREFILLED SYRINGE SUBCUTANEOUSLY 3 TIMES PER WEEK       Micker  Samios, NP-S 08/25/2014 7:41 PM  I was present for the exam and agree with above.  [place], [place] 08/25/2014 7:41 PM

## 2014-09-25 ENCOUNTER — Other Ambulatory Visit: Payer: Self-pay | Admitting: Neurology

## 2014-09-28 ENCOUNTER — Telehealth: Payer: Self-pay | Admitting: Neurology

## 2014-09-28 NOTE — Telephone Encounter (Signed)
They are requesting an ICD 10 code.  This info has been faxed per plan request to [phone] Ref Case# [id number]

## 2014-09-28 NOTE — Telephone Encounter (Signed)
Teresa Arnold from [facility] [employer] is calling because they need a supporting statement notes for the Rx Rebidose 44 MCG/.05 ML SOAJ.  Please send to same.

## 2014-11-24 ENCOUNTER — Encounter: Payer: Self-pay | Admitting: Neurology

## 2014-11-24 ENCOUNTER — Ambulatory Visit (INDEPENDENT_AMBULATORY_CARE_PROVIDER_SITE_OTHER): Payer: Medicare Other | Admitting: Neurology

## 2014-11-24 VITALS — BP 139/82 | HR 83 | Ht 64.0 in | Wt 245.0 lb

## 2014-11-24 DIAGNOSIS — Z5181 Encounter for therapeutic drug level monitoring: Secondary | ICD-10-CM | POA: Diagnosis not present

## 2014-11-24 DIAGNOSIS — G35 Multiple sclerosis: Secondary | ICD-10-CM | POA: Diagnosis not present

## 2014-11-24 MED ORDER — PREDNISONE 10 MG PO TABS: ORAL_TABLET | ORAL | Status: DC

## 2014-11-24 NOTE — Patient Instructions (Signed)

## 2014-11-24 NOTE — Progress Notes (Signed)
Reason for visit: Multiple sclerosis  Teresa Arnold is an 39 y.o. female  History of present illness:  Teresa Arnold is a 39 year old right-handed black female with a history of multiple sclerosis, currently treated with Rebif. The patient is tolerating the Rebif fairly well. She reports that she has not had an exacerbation since September 2014 until yesterday. The patient had onset of blurred vision in the left eye, and she reports double vision. When she covers the left eye, the vision in the right eye is better. The patient is able to alleviate the double vision by doing this as well. The double vision is horizontal in nature. She has had no other symptoms of numbness or weakness of the extremities or face, she denies any change in balance or difficulty controlling the bowels or the bladder. She denies any pain with movement of the eyes. She comes to this office for further evaluation. The last blood work was done in June 2015. The patient had MRI evaluation of the brain around that same time. She can only have studies without contrast, as she had a reaction to the gadolinium in the past.  Past Medical History  Diagnosis Date  . MS (multiple sclerosis)   . Bronchitis     Past Surgical History  Procedure Laterality Date  . Cesarean section      x4    Family History  Problem Relation Age of Onset  . Hypertension Mother   . Hypertension Other     Social history:  reports that she has never smoked. She has never used smokeless tobacco. She reports that she drinks alcohol. She reports that she does not use illicit drugs.    Allergies  Allergen Reactions  . Sulfa Antibiotics   . Gadolinium Derivatives Nausea And Vomiting    Pt only received half dose of 10ml before getting sick.   Teresa Arnold [Natalizumab] Rash    Medications:  Prior to Admission medications   Medication Sig Start Date End Date Taking? Authorizing Provider  HYDROcodone-acetaminophen (NORCO/VICODIN) 5-325  MG per tablet Take 2 tablets by mouth every 4 (four) hours as needed. 08/19/13   Fayrene Helper, PA-C  interferon beta-1a (REBIF REBIDOSE) 44 MCG/0.5ML injection INJECT 1 PREFILLED SYRINGE SUBCUTANEOUSLY 3 TIMES PER WEEK 06/11/14   York Spaniel, MD  REBIF REBIDOSE 44 MCG/0.5ML SOAJ INJECT 1 PREFILLED SYRINGE SUBCUTANEOUSLY 3 TIMES PER WEEK 09/25/14   York Spaniel, MD    ROS:  Out of a complete 14 system review of symptoms, the patient complains only of the following symptoms, and all other reviewed systems are negative.  Double vision Right eye discomfort Flushing  Blood pressure 139/82, pulse 83, height  (1.626 m), weight 245 lb (111.131 kg).  Physical Exam  General: The patient is alert and cooperative at the time of the examination. The patient is markedly obese.  Skin: No significant peripheral edema is noted.   Neurologic Exam  Mental status: The patient is alert and oriented x 3 at the time of the examination. The patient has apparent normal recent and remote memory, with an apparently normal attention span and concentration ability.   Cranial nerves: Facial symmetry is present. Speech is normal, no aphasia or dysarthria is noted. Extraocular movements are full. There is no evidence of an INO. Visual fields are full. Pupils are equal, round, and reactive to light.  Motor: The patient has good strength in all 4 extremities.  Sensory examination: Soft touch and pinprick sensation is symmetric  on the face, arms, and legs.  Coordination: The patient has good finger-nose-finger and heel-to-shin bilaterally.  Gait and station: The patient has a normal gait. Tandem gait is slightly unsteady. Romberg is negative, but is unsteady. No drift is seen.  Reflexes: Deep tendon reflexes are symmetric.   MRI brain 02/27/14:  IMPRESSION:  Abnormal MRI brain (without) demonstrating: 1. Multiple round and ovoid, periventricular, subcortical, pontine and cerebellar chronic  demyelinating plaques. Some of these are hypointense on T1 views. 2. Compared to MRI on 04/06/11, there are a few new chronic plaques in the cerebellum. In other areas, a few of the previous chronic plaques are no longer seen in the current study.  * MRI scan images were reviewed online. I agree with the written report.    Assessment/Plan:  1. Multiple sclerosis  2. Obesity  3. Subjective visual disturbance, new onset  The patient appears to had a mild exacerbation of her multiple sclerosis. The patient reports some double vision and some blurring of vision in the left eye. The patient will be placed on a prednisone Dosepak, and she will have a visual evoked response test done. The patient will continue the Rebif for now. She will have blood work done today. She will follow-up in 6 months or sooner if needed. She will contact me if her symptoms continue to worsen.  Marlan Palau MD 11/24/2014 8:23 PM  [facility] [address] [place], [place] [address]  Phone [phone] Fax [phone]

## 2014-11-25 LAB — CBC WITH DIFFERENTIAL/PLATELET
BASOS: 0 %
Basophils Absolute: 0 10*3/uL (ref 0.0–0.2)
EOS ABS: 0.1 10*3/uL (ref 0.0–0.4)
Eos: 2 %
HCT: 37.6 % (ref 34.0–46.6)
HEMOGLOBIN: 11.7 g/dL (ref 11.1–15.9)
Immature Grans (Abs): 0 10*3/uL (ref 0.0–0.1)
Immature Granulocytes: 0 %
LYMPHS ABS: 2 10*3/uL (ref 0.7–3.1)
LYMPHS: 38 %
MCH: 25.8 pg — ABNORMAL LOW (ref 26.6–33.0)
MCHC: 31.1 g/dL — ABNORMAL LOW (ref 31.5–35.7)
MCV: 83 fL (ref 79–97)
MONOCYTES: 11 %
Monocytes Absolute: 0.6 10*3/uL (ref 0.1–0.9)
NEUTROS PCT: 49 %
Neutrophils Absolute: 2.6 10*3/uL (ref 1.4–7.0)
Platelets: 304 10*3/uL (ref 150–379)
RBC: 4.54 x10E6/uL (ref 3.77–5.28)
RDW: 13.7 % (ref 12.3–15.4)
WBC: 5.4 10*3/uL (ref 3.4–10.8)

## 2014-11-25 LAB — COMPREHENSIVE METABOLIC PANEL
ALBUMIN: 3.8 g/dL (ref 3.5–5.5)
ALT: 9 IU/L (ref 0–32)
AST: 16 IU/L (ref 0–40)
Albumin/Globulin Ratio: 1.1 (ref 1.1–2.5)
Alkaline Phosphatase: 83 IU/L (ref 39–117)
BILIRUBIN TOTAL: 0.3 mg/dL (ref 0.0–1.2)
BUN / CREAT RATIO: 21 — AB (ref 8–20)
BUN: 12 mg/dL (ref 6–20)
CHLORIDE: 101 mmol/L (ref 97–108)
CO2: 25 mmol/L (ref 18–29)
Calcium: 8.7 mg/dL (ref 8.7–10.2)
Creatinine, Ser: 0.58 mg/dL (ref 0.57–1.00)
GFR calc non Af Amer: 119 mL/min/{1.73_m2} (ref >59)
GFR, EST AFRICAN AMERICAN: 137 mL/min/{1.73_m2} (ref >59)
Globulin, Total: 3.6 g/dL (ref 1.5–4.5)
Glucose: 89 mg/dL (ref 65–99)
Potassium: 4 mmol/L (ref 3.5–5.2)
SODIUM: 141 mmol/L (ref 134–144)
Total Protein: 7.4 g/dL (ref 6.0–8.5)

## 2014-11-27 ENCOUNTER — Ambulatory Visit (INDEPENDENT_AMBULATORY_CARE_PROVIDER_SITE_OTHER): Payer: Medicare Other | Admitting: Neurology

## 2014-11-27 ENCOUNTER — Telehealth: Payer: Self-pay | Admitting: Neurology

## 2014-11-27 DIAGNOSIS — G35 Multiple sclerosis: Secondary | ICD-10-CM

## 2014-11-27 NOTE — Telephone Encounter (Signed)
I called patient. The patient is to come in next week for Solu-Medrol for optic neuritis that is new on the left side. We'll do a three-day course of 5 mg each dose.

## 2014-11-27 NOTE — Procedures (Signed)
     Teresa Arnold is a 38 year old patient with multiple sclerosis who reports recent onset of blurring of vision of the left eye. She has a history of prior involvement of the right optic nerve. The patient is being evaluated for a new left optic neuritis.  Description: Visual evoked response test was performed on the left side using 32 x 32, 16 x 16, 8 x 8, and 4 x 4 check sizes. No response was seen. Visual on the left is 20/100 corrected. Study on the right side reveals prolongation of the P100 and the N1 waveforms. Visual acuity is 20/30 corrected.  Impression: This is an abnormal visual response test secondary to absence of response on the left and prolongation of conduction in the anterior visual pathway on the right. This study is consistent with bilateral anterior optic pathway involvement. A prior visual evoked response test done on 05/05/2011 revealed normal responses on the left, prolongation on the right. This study suggests a new lesion in the anterior visual pathway on the left, consistent with an optic neuritis.

## 2014-11-30 ENCOUNTER — Telehealth: Payer: Self-pay | Admitting: *Deleted

## 2014-11-30 DIAGNOSIS — G35 Multiple sclerosis: Secondary | ICD-10-CM | POA: Diagnosis not present

## 2014-11-30 NOTE — Telephone Encounter (Signed)
Order signed and given to Inetta Fermo, Charity fundraiser.

## 2014-11-30 NOTE — Telephone Encounter (Signed)
I spoke to pt and she will have someone bring her in today at 1300 for solumedrol infusion 500mg  IV x 3 days.

## 2014-12-01 DIAGNOSIS — G35 Multiple sclerosis: Secondary | ICD-10-CM | POA: Diagnosis not present

## 2014-12-02 DIAGNOSIS — G35 Multiple sclerosis: Secondary | ICD-10-CM | POA: Diagnosis not present

## 2014-12-28 ENCOUNTER — Telehealth: Payer: Self-pay | Admitting: Nurse Practitioner

## 2014-12-28 NOTE — Telephone Encounter (Signed)
Harriett Sine a nurse with [employer]) is questing is there a final diagnosis possibly of MS flair or what is the diagnosis for the patient? Was it related to the Rebiff?  Please call Harriett Sine at [phone].

## 2015-01-22 ENCOUNTER — Other Ambulatory Visit: Payer: Self-pay

## 2015-01-22 MED ORDER — INTERFERON BETA-1A 44 MCG/0.5ML ~~LOC~~ SOAJ: 44.0000 ug | SUBCUTANEOUS | Status: DC

## 2015-01-25 ENCOUNTER — Telehealth: Payer: Self-pay | Admitting: Neurology

## 2015-01-25 NOTE — Telephone Encounter (Signed)
I called back to clarify.  Spoke with Selena Batten.  She was no able to assist and transferred me to [facility].  They are in the process of enrolling the patient in their system, and wanted to clarify she is taking Rebif.  I verified this info.  As well, they said they will need to speak to the patient to complete enrollment.  I called the patient.  Got no answer.  Left message relaying [facility] needs to speak with her to complete enrollment.

## 2015-01-25 NOTE — Telephone Encounter (Signed)
Tiffany from [facility] called in regards to the script for Interferon Beta-1a (REBIF REBIDOSE) 44 MCG/0.5ML SOAJ. She states that order form needs to be filled out. Please call and advise. Tiffany can be reached @ [phone]

## 2015-04-13 ENCOUNTER — Encounter: Payer: Self-pay | Admitting: Family Medicine

## 2015-04-13 ENCOUNTER — Ambulatory Visit (INDEPENDENT_AMBULATORY_CARE_PROVIDER_SITE_OTHER): Payer: Medicare Other | Admitting: Family Medicine

## 2015-04-13 VITALS — BP 127/75 | HR 77 | Temp 98.2°F | Resp 16 | Ht 64.0 in | Wt 241.0 lb

## 2015-04-13 DIAGNOSIS — Z0000 Encounter for general adult medical examination without abnormal findings: Secondary | ICD-10-CM

## 2015-04-13 DIAGNOSIS — B009 Herpesviral infection, unspecified: Secondary | ICD-10-CM | POA: Insufficient documentation

## 2015-04-13 MED ORDER — VALACYCLOVIR HCL 1 G PO TABS: 1.0000 g | ORAL_TABLET | Freq: Every day | ORAL | Status: DC

## 2015-04-13 NOTE — Patient Instructions (Signed)
Try to follow a healthy diet that is high in fresh fruits and vegetables and low in fats and cholesterol. Try to walk daily. We will check on the status of your last pap and let you know if you need one soon.

## 2015-04-13 NOTE — Progress Notes (Signed)
Patient ID: Teresa Arnold, female   DOB: November 28, 1975, 39 y.o.   MRN: [id number]   Teresa Arnold, is a 39 y.o. female  [id number]  [id number]  DOB - 17-Apr-1976  CC:  Chief Complaint  Patient presents with  . Establish Care    refills on medications        HPI: Teresa Arnold is a 39 y.o. female here to establish care. She has recently changed insurance and  Her previous physician does not accept her insurance.  Her major concern today is a need to get her Valtrex for Genital herpes. She has a history of MS and is followed by Dr. Anne Hahn, neurologist. Her only medications are Refib-Rebidose and Valtrex. She denies tobacco use, Has a couple of drinks of alcohol weekly, Denies illicit drug use.  Allergies  Allergen Reactions  . Sulfa Antibiotics   . Gadolinium Derivatives Nausea And Vomiting    Pt only received half dose of 10ml before getting sick.   [name] [Natalizumab] Rash   Past Medical History  Diagnosis Date  . MS (multiple sclerosis)   . Bronchitis    Current Outpatient Prescriptions on File Prior to Visit  Medication Sig Dispense Refill  . HYDROcodone-acetaminophen (NORCO/VICODIN) 5-325 MG per tablet Take 2 tablets by mouth every 4 (four) hours as needed. 6 tablet 0  . Interferon Beta-1a (REBIF REBIDOSE) 44 MCG/0.5ML SOAJ Inject 44 mcg into the skin 3 (three) times a week. 12 Syringe 6  . valACYclovir (VALTREX) 1000 MG tablet Take 1 g by mouth daily.  12  . predniSONE (DELTASONE) 10 MG tablet Begin taking 6 tablets daily, taper by one tablet every other day until off the medication. (Patient not taking: Reported on 04/13/2015) 42 tablet 0   No current facility-administered medications on file prior to visit.   Family History  Problem Relation Age of Onset  . Hypertension Mother   . Hypertension Other    History   Social History  . Marital Status: Single    Spouse Name: N/A  . Number of Children: 4  . Years of Education: 14   Occupational  History  . Not on file.   Social History Main Topics  . Smoking status: Never Smoker   . Smokeless tobacco: Never Used  . Alcohol Use: Yes     Comment: ocassional  . Drug Use: No  . Sexual Activity: Yes    Birth Control/ Protection: None   Other Topics Concern  . Not on file   Social History Narrative   Patient is single and lives with her family.   Patient has four children.   Patient has a college education.   Patient is right-handed.   Patient does not drink any caffeine.    Review of Systems: Constitutional: Negative for fever, chills, appetite change, weight loss. Positive for  Fatigue. Skin: Negative for rashes or lesions of concern. HENT: Negative for ear pain, ear discharge.nose bleeds Eyes: Negative for pain, discharge, redness, itching and visual disturbance. Wears glasses Neck: Negative for pain, stiffness Respiratory: Negative for cough, shortness of breath,   Cardiovascular: Negative for chest pain, palpitations and leg swelling. Gastrointestinal: Negative for abdominal pain, nausea, vomiting, diarrhea, constipations Genitourinary: Negative for dysuria, urgency, frequency, hematuria,  Musculoskeletal: Negative for back pain, joint pain, joint  swelling, and gait problem. Positive  for weakness of legs when fatigued related to MS.Occassionally uses a cane. Neurological: Negative for dizziness, tremors, seizures, syncope,   light-headedness, numbness and headaches.  Hematological: Negative for easy bleeding.  She feels she bruises easily Psychiatric/Behavioral: Negative for depression, anxiety, decreased concentration. Positive for some memory issues related to MS.   Objective:   Filed Vitals:   04/13/15 0922  BP: 127/75  Pulse: 77  Temp: 98.2 F (36.8 C)  Resp: 16    Physical Exam: Constitutional: Patient appears well-developed and well-nourished. No distress. HENT: Normocephalic, atraumatic, External right and left ear normal. Oropharynx is clear and  moist.  Eyes: Conjunctivae and EOM are normal. PERRLA, no scleral icterus. Neck: Normal ROM. Neck supple. No lymphadenopathy, No thyromegaly. CVS: RRR, S1/S2 +, no murmurs, no gallops, no rubs Pulmonary: Effort and breath sounds normal, no stridor, rhonchi, wheezes, rales.  Abdominal: Soft. Normoactive BS,, no distension, tenderness, rebound or guarding.  Musculoskeletal: Normal range of motion. No edema and no tenderness.  Neuro: Alert.Normal muscle tone coordination. Non-focal Skin: Skin is warm and dry. No rash noted. Not diaphoretic. No erythema. No pallor. Psychiatric: Normal mood and affect. Behavior, judgment, thought content normal.  Lab Results  Component Value Date   WBC 5.4 11/24/2014   HGB 11.7 11/24/2014   HCT 37.6 11/24/2014   MCV 83 11/24/2014   PLT 304 11/24/2014   Lab Results  Component Value Date   CREATININE 0.58 11/24/2014   BUN 12 11/24/2014   NA 141 11/24/2014   K 4.0 11/24/2014   CL 101 11/24/2014   CO2 25 11/24/2014    No results found for: HGBA1C Lipid Panel  No results found for: CHOL, TRIG, HDL, CHOLHDL, VLDL, LDLCALC     Assessment and plan:   Establish care -Reviewed available health records, medications and histories and labs. No need for repeat bloodwork at this time. -Have asked her to sign to get her records from previous doctor as she in unsure when she had a PAP last.  Genital Herpes -Valtrex 1000 mg BID  MS -follow-up with Dr. Anne Hahn as planned. There are no diagnoses linked to this encounter.  Follow-up one year and prn.    The patient was given clear instructions to go to ER or return to medical center if symptoms don't improve, worsen or new problems develop. The patient verbalized understanding. The patient was told to call to get lab results if they haven't heard anything in the next week.         04/13/2015, 9:57 AM

## 2015-05-27 ENCOUNTER — Encounter: Payer: Self-pay | Admitting: Adult Health

## 2015-05-27 ENCOUNTER — Ambulatory Visit (INDEPENDENT_AMBULATORY_CARE_PROVIDER_SITE_OTHER): Payer: Medicare Other | Admitting: Adult Health

## 2015-05-27 VITALS — BP 126/88 | HR 72 | Ht 64.0 in | Wt 240.0 lb

## 2015-05-27 DIAGNOSIS — Z5181 Encounter for therapeutic drug level monitoring: Secondary | ICD-10-CM | POA: Diagnosis not present

## 2015-05-27 DIAGNOSIS — G35 Multiple sclerosis: Secondary | ICD-10-CM | POA: Diagnosis not present

## 2015-05-27 NOTE — Progress Notes (Signed)
PATIENT: Teresa Arnold DOB: 1976-03-26  REASON FOR VISIT: follow up- multiple sclerosis HISTORY FROM: patient  HISTORY OF PRESENT ILLNESS: [name] is a 39 year old female with a history of multiple sclerosis. She returns today for follow-up. She is currently taking Rebif and tolerating it well. Since the last visit she states that her blurry vision has resolved. Denies any changes with her gait or balance. Denies any new numbness or weakness. No changes in the bowels or bladder. She returns today for an evaluation.  HISTORY  11/24/14: [name] is a 39 year old right-handed black female with a history of multiple sclerosis, currently treated with Rebif. The patient is tolerating the Rebif fairly well. She reports that she has not had an exacerbation since September 2014 until yesterday. The patient had onset of blurred vision in the left eye, and she reports double vision. When she covers the left eye, the vision in the right eye is better. The patient is able to alleviate the double vision by doing this as well. The double vision is horizontal in nature. She has had no other symptoms of numbness or weakness of the extremities or face, she denies any change in balance or difficulty controlling the bowels or the bladder. She denies any pain with movement of the eyes. She comes to this office for further evaluation. The last blood work was done in June 2015. The patient had MRI evaluation of the brain around that same time. She can only have studies without contrast, as she had a reaction to the gadolinium in the past.  REVIEW OF SYSTEMS: Out of a complete 14 system review of symptoms, the patient complains only of the following symptoms, and all other reviewed systems are negative.  See history of present illness  ALLERGIES: Allergies  Allergen Reactions  . Sulfa Antibiotics   . Gadolinium Derivatives Nausea And Vomiting    Pt only received half dose of 38ml before getting sick.     Teresa Arnold [Natalizumab] Rash    HOME MEDICATIONS: Outpatient Prescriptions Prior to Visit  Medication Sig Dispense Refill  . Interferon Beta-1a (REBIF REBIDOSE) 44 MCG/0.5ML SOAJ Inject 44 mcg into the skin 3 (three) times a week. 12 Syringe 6  . valACYclovir (VALTREX) 1000 MG tablet Take 1 tablet (1,000 mg total) by mouth daily. 90 tablet 3  . HYDROcodone-acetaminophen (NORCO/VICODIN) 5-325 MG per tablet Take 2 tablets by mouth every 4 (four) hours as needed. (Patient not taking: Reported on 05/27/2015) 6 tablet 0  . predniSONE (DELTASONE) 10 MG tablet Begin taking 6 tablets daily, taper by one tablet every other day until off the medication. (Patient not taking: Reported on 05/27/2015) 42 tablet 0   No facility-administered medications prior to visit.    PAST MEDICAL HISTORY: Past Medical History  Diagnosis Date  . MS (multiple sclerosis)   . Bronchitis     PAST SURGICAL HISTORY: Past Surgical History  Procedure Laterality Date  . Cesarean section      x4    FAMILY HISTORY: Family History  Problem Relation Age of Onset  . Hypertension Mother   . Hypertension Other     SOCIAL HISTORY: Social History   Social History  . Marital Status: Single    Spouse Name: N/A  . Number of Children: 4  . Years of Education: 14   Occupational History  . Not on file.   Social History Main Topics  . Smoking status: Never Smoker   . Smokeless tobacco: Never Used  . Alcohol  Use: 0.0 oz/week    0 Standard drinks or equivalent per week     Comment: ocassional  . Drug Use: No  . Sexual Activity: Yes    Birth Control/ Protection: None   Other Topics Concern  . Not on file   Social History Narrative   Patient is single and lives with her family.   Patient has four children.   Patient has a college education.   Patient is right-handed.   Patient does not drink any caffeine.      PHYSICAL EXAM  Filed Vitals:   05/27/15 1135  BP: 126/88  Pulse: 72  Height:  (1.626  m)  Weight: 240 lb (108.863 kg)   Body mass index is 41.18 kg/(m^2).  Generalized: Well developed, in no acute distress   Neurological examination  Mentation: Alert oriented to time, place, history taking. Follows all commands speech and language fluent Cranial nerve II-XII: Pupils were equal round reactive to light. Extraocular movements were full, visual field were full on confrontational test. Facial sensation and strength were normal. Uvula tongue midline. Head turning and shoulder shrug  were normal and symmetric. Motor: The motor testing reveals 5 over 5 strength of all 4 extremities. Good symmetric motor tone is noted throughout.  Sensory: Sensory testing is intact to soft touch on all 4 extremities. No evidence of extinction is noted.  Coordination: Cerebellar testing reveals good finger-nose-finger and heel-to-shin bilaterally.  Gait and station: Gait is normal. Tandem gait is normal. Romberg is negative. No drift is seen.  Reflexes: Deep tendon reflexes are symmetric and normal bilaterally.   DIAGNOSTIC DATA (LABS, IMAGING, TESTING) - I reviewed patient records, labs, notes, testing and imaging myself where available.  Lab Results  Component Value Date   WBC 5.4 11/24/2014   HGB 11.7 11/24/2014   HCT 37.6 11/24/2014   MCV 83 11/24/2014   PLT 304 11/24/2014      Component Value Date/Time   NA 141 11/24/2014 0839   NA 137 08/19/2013 0550   K 4.0 11/24/2014 0839   CL 101 11/24/2014 0839   CO2 25 11/24/2014 0839   GLUCOSE 89 11/24/2014 0839   GLUCOSE 129* 08/19/2013 0550   BUN 12 11/24/2014 0839   BUN 16 08/19/2013 0550   CREATININE 0.58 11/24/2014 0839   CALCIUM 8.7 11/24/2014 0839   PROT 7.4 11/24/2014 0839   PROT 8.4* 08/19/2013 0550   ALBUMIN 3.6 08/19/2013 0550   AST 16 11/24/2014 0839   ALT 9 11/24/2014 0839   ALKPHOS 83 11/24/2014 0839   BILITOT 0.3 11/24/2014 0839   BILITOT <0.2 03/10/2014 1429   GFRNONAA 119 11/24/2014 0839   GFRAA 137 11/24/2014 0839        ASSESSMENT AND PLAN 39 y.o. year old female  has a past medical history of MS (multiple sclerosis) and Bronchitis. here with:  1. Multiple sclerosis  Overall the patient has done quite well. She will continue on rebif. Her last MRI of the brain was in June 2015. We will repeat this to look for any progression of her multiple sclerosis. I will check blood work today. Patient advised that if her symptoms worsen or she develops any new symptoms she should give [place] a call. She will follow-up in 6 months or sooner if needed.   Butch Penny, MSN, NP-C 05/27/2015, 11:41 AM [facility] [address] [place], [place] [address] [phone]

## 2015-05-27 NOTE — Patient Instructions (Signed)
Continue Rebif We will recheck MRI of brain Check blood work today. If your symptoms worsen or you develop new symptoms please let us know.

## 2015-05-27 NOTE — Progress Notes (Signed)
I have read the note, and I agree with the clinical assessment and plan.  Lourdes Manning KEITH   

## 2015-05-28 LAB — COMPREHENSIVE METABOLIC PANEL
ALBUMIN: 3.8 g/dL (ref 3.5–5.5)
ALK PHOS: 72 IU/L (ref 39–117)
ALT: 13 IU/L (ref 0–32)
AST: 16 IU/L (ref 0–40)
Albumin/Globulin Ratio: 1.2 (ref 1.1–2.5)
BUN / CREAT RATIO: 24 — AB (ref 8–20)
BUN: 12 mg/dL (ref 6–20)
Bilirubin Total: 0.3 mg/dL (ref 0.0–1.2)
CO2: 23 mmol/L (ref 18–29)
Calcium: 8.8 mg/dL (ref 8.7–10.2)
Chloride: 101 mmol/L (ref 97–108)
Creatinine, Ser: 0.5 mg/dL — ABNORMAL LOW (ref 0.57–1.00)
GFR calc Af Amer: 143 mL/min/{1.73_m2} (ref >59)
GFR calc non Af Amer: 124 mL/min/{1.73_m2} (ref >59)
Globulin, Total: 3.3 g/dL (ref 1.5–4.5)
Glucose: 81 mg/dL (ref 65–99)
Potassium: 4.3 mmol/L (ref 3.5–5.2)
Sodium: 138 mmol/L (ref 134–144)
Total Protein: 7.1 g/dL (ref 6.0–8.5)

## 2015-05-28 LAB — CBC WITH DIFFERENTIAL/PLATELET
Basophils Absolute: 0 10*3/uL (ref 0.0–0.2)
Basos: 0 %
EOS (ABSOLUTE): 0.1 10*3/uL (ref 0.0–0.4)
EOS: 2 %
HEMATOCRIT: 36.3 % (ref 34.0–46.6)
HEMOGLOBIN: 11.5 g/dL (ref 11.1–15.9)
IMMATURE GRANULOCYTES: 0 %
Immature Grans (Abs): 0 10*3/uL (ref 0.0–0.1)
Lymphocytes Absolute: 2.1 10*3/uL (ref 0.7–3.1)
Lymphs: 42 %
MCH: 25.6 pg — ABNORMAL LOW (ref 26.6–33.0)
MCHC: 31.7 g/dL (ref 31.5–35.7)
MCV: 81 fL (ref 79–97)
MONOCYTES: 14 %
MONOS ABS: 0.7 10*3/uL (ref 0.1–0.9)
NEUTROS PCT: 42 %
Neutrophils Absolute: 2.1 10*3/uL (ref 1.4–7.0)
Platelets: 290 10*3/uL (ref 150–379)
RBC: 4.5 x10E6/uL (ref 3.77–5.28)
RDW: 13.6 % (ref 12.3–15.4)
WBC: 5 10*3/uL (ref 3.4–10.8)

## 2015-05-31 ENCOUNTER — Telehealth: Payer: Self-pay

## 2015-05-31 ENCOUNTER — Telehealth: Payer: Self-pay | Admitting: Adult Health

## 2015-05-31 DIAGNOSIS — G35 Multiple sclerosis: Secondary | ICD-10-CM

## 2015-05-31 NOTE — Telephone Encounter (Signed)
Patient called regarding test at [facility]. Order is for "with contrast", patient is allergic to contrast. [facility] advised patient the only way it can be changed is for the Provider to make the change in the order.

## 2015-05-31 NOTE — Telephone Encounter (Signed)
-----   Message from Butch Penny, NP sent at 05/28/2015  3:52 PM EDT ----- Lab work is unremarkable. Please call patient and fax to PCP.

## 2015-05-31 NOTE — Telephone Encounter (Signed)
Called pt and advised her that her MRI was changed to without contrast. Pt verbalized understanding.

## 2015-05-31 NOTE — Telephone Encounter (Signed)
Called and relayed to patient labs were normal and I will fax to her PCP. Patient understood process.

## 2015-05-31 NOTE — Telephone Encounter (Signed)
Order placed- Mri brain without contrast. Please let the patient know.

## 2015-06-15 ENCOUNTER — Ambulatory Visit
Admission: RE | Admit: 2015-06-15 | Discharge: 2015-06-15 | Disposition: A | Discharge status: Home / Self Care | Payer: Medicare Other | Source: Ambulatory Visit | Attending: Adult Health | Admitting: Adult Health

## 2015-06-15 DIAGNOSIS — G35 Multiple sclerosis: Secondary | ICD-10-CM

## 2015-06-17 ENCOUNTER — Telehealth: Payer: Self-pay

## 2015-06-17 NOTE — Telephone Encounter (Signed)
Called patient. Gave results. Patient verbalized understanding.    

## 2015-06-17 NOTE — Telephone Encounter (Signed)
-----   Message from Butch Penny, NP sent at 06/17/2015  7:34 AM EDT ----- No significant change from previous MRI. Please call patient.

## 2015-08-11 ENCOUNTER — Other Ambulatory Visit: Payer: Self-pay

## 2015-08-11 MED ORDER — INTERFERON BETA-1A 44 MCG/0.5ML ~~LOC~~ SOAJ: 44.0000 ug | SUBCUTANEOUS | Status: DC

## 2015-10-25 ENCOUNTER — Telehealth: Payer: Self-pay

## 2015-10-25 NOTE — Telephone Encounter (Signed)
[employer] has approved PA 07/23/15-10/20/16. Member ID: [id number]. Phone number: [phone].

## 2015-11-16 ENCOUNTER — Other Ambulatory Visit: Payer: Self-pay

## 2015-11-16 MED ORDER — INTERFERON BETA-1A 44 MCG/0.5ML ~~LOC~~ SOAJ: 44.0000 ug | SUBCUTANEOUS | Status: DC

## 2015-11-24 ENCOUNTER — Ambulatory Visit (INDEPENDENT_AMBULATORY_CARE_PROVIDER_SITE_OTHER): Payer: Medicare Other | Admitting: Adult Health

## 2015-11-24 ENCOUNTER — Encounter: Payer: Self-pay | Admitting: Adult Health

## 2015-11-24 VITALS — BP 124/86 | HR 76 | Resp 14 | Ht 64.0 in | Wt 258.5 lb

## 2015-11-24 DIAGNOSIS — G35 Multiple sclerosis: Secondary | ICD-10-CM | POA: Diagnosis not present

## 2015-11-24 DIAGNOSIS — Z5181 Encounter for therapeutic drug level monitoring: Secondary | ICD-10-CM

## 2015-11-24 NOTE — Progress Notes (Signed)
PATIENT: Teresa Arnold DOB: 03-Aug-1976  REASON FOR VISIT: follow up- multiple sclerosis HISTORY FROM: patient  HISTORY OF PRESENT ILLNESS: [name] is a 40 year old female with a history of multiple sclerosis. She returns today for follow-up. She is currently taken rebif and tolerating it well. She states that she was having some pain at the injection site but she rotated to the other arm and this has improved. She denies any changes with her gait or balance. She reports that she has intermittent numbness in the right leg. She states that she had this when she was first diagnosed but it had improved however recently she has noticed that again. She reports that she's been having blurry vision and will be receiving new glasses. She denies any changes with her bowels or bladder. She denies any significant fatigue. She returns today for an evaluation.   05/27/15 (MM): Ms Teresa Arnold is a 40 year old female with a history of multiple sclerosis. She returns today for follow-up. She is currently taking Rebif and tolerating it well. Since the last visit she states that her blurry vision has resolved. Denies any changes with her gait or balance. Denies any new numbness or weakness. No changes in the bowels or bladder. She returns today for an evaluation.  HISTORY  11/24/14: [name] is a 40 year old right-handed black female with a history of multiple sclerosis, currently treated with Rebif. The patient is tolerating the Rebif fairly well. She reports that she has not had an exacerbation since September 2014 until yesterday. The patient had onset of blurred vision in the left eye, and she reports double vision. When she covers the left eye, the vision in the right eye is better. The patient is able to alleviate the double vision by doing this as well. The double vision is horizontal in nature. She has had no other symptoms of numbness or weakness of the extremities or face, she denies any change in  balance or difficulty controlling the bowels or the bladder. She denies any pain with movement of the eyes. She comes to this office for further evaluation. The last blood work was done in June 2015. The patient had MRI evaluation of the brain around that same time. She can only have studies without contrast, as she had a reaction to the gadolinium in the past.   REVIEW OF SYSTEMS: Out of a complete 14 system review of symptoms, the patient complains only of the following symptoms, and all other reviewed systems are negative.  Blurred vision, numbness, restless leg  ALLERGIES: Allergies  Allergen Reactions  . Sulfa Antibiotics   . Gadolinium Derivatives Nausea And Vomiting    Pt only received half dose of 10ml before getting sick.   [name] [Natalizumab] Rash    HOME MEDICATIONS: Outpatient Prescriptions Prior to Visit  Medication Sig Dispense Refill  . Interferon Beta-1a (REBIF REBIDOSE) 44 MCG/0.5ML SOAJ Inject 44 mcg into the skin 3 (three) times a week. 12 Syringe 3  . valACYclovir (VALTREX) 1000 MG tablet Take 1 tablet (1,000 mg total) by mouth daily. 90 tablet 3   No facility-administered medications prior to visit.    PAST MEDICAL HISTORY: Past Medical History  Diagnosis Date  . MS (multiple sclerosis) (HCC)   . Bronchitis     PAST SURGICAL HISTORY: Past Surgical History  Procedure Laterality Date  . Cesarean section      x4    FAMILY HISTORY: Family History  Problem Relation Age of Onset  . Hypertension Mother   .  Hypertension Other     SOCIAL HISTORY: Social History   Social History  . Marital Status: Single    Spouse Name: N/A  . Number of Children: 4  . Years of Education: 14   Occupational History  . Not on file.   Social History Main Topics  . Smoking status: Never Smoker   . Smokeless tobacco: Never Used  . Alcohol Use: 0.0 oz/week    0 Standard drinks or equivalent per week     Comment: ocassional  . Drug Use: No  . Sexual Activity:  Yes    Birth Control/ Protection: None   Other Topics Concern  . Not on file   Social History Narrative   Patient is single and lives with her family.   Patient has four children.   Patient has a college education.   Patient is right-handed.   Patient does not drink any caffeine.      PHYSICAL EXAM  Filed Vitals:   11/24/15 1100  BP: 124/86  Pulse: 76  Resp: 14  Height: 5\' 4"  (1.626 m)  Weight: 258 lb 8 oz (117.255 kg)   Body mass index is 44.35 kg/(m^2).  Generalized: Well developed, in no acute distress   Neurological examination  Mentation: Alert oriented to time, place, history taking. Follows all commands speech and language fluent Cranial nerve II-XII: Pupils were equal round reactive to light. Extraocular movements were full, visual field were full on confrontational test. Facial sensation and strength were normal. Uvula tongue midline. Head turning and shoulder shrug  were normal and symmetric. Motor: The motor testing reveals 5 over 5 strength of all 4 extremities. Good symmetric motor tone is noted throughout.  Sensory: Sensory testing is intact to soft touch on all 4 extremities. No evidence of extinction is noted.  Coordination: Cerebellar testing reveals good finger-nose-finger and heel-to-shin bilaterally.  Gait and station: Gait is normal. Tandem gait is unsteady. Romberg is negative. No drift is seen.  Reflexes: Deep tendon reflexes are symmetric and normal bilaterally.   DIAGNOSTIC DATA (LABS, IMAGING, TESTING) - I reviewed patient records, labs, notes, testing and imaging myself where available.  Lab Results  Component Value Date   WBC 5.0 05/27/2015   HGB 11.7 11/24/2014   HCT 36.3 05/27/2015   MCV 81 05/27/2015   PLT 290 05/27/2015      Component Value Date/Time   NA 138 05/27/2015 1202   NA 137 08/19/2013 0550   K 4.3 05/27/2015 1202   CL 101 05/27/2015 1202   CO2 23 05/27/2015 1202   GLUCOSE 81 05/27/2015 1202   GLUCOSE 129* 08/19/2013  0550   BUN 12 05/27/2015 1202   BUN 16 08/19/2013 0550   CREATININE 0.50* 05/27/2015 1202   CALCIUM 8.8 05/27/2015 1202   PROT 7.1 05/27/2015 1202   PROT 8.4* 08/19/2013 0550   ALBUMIN 3.8 05/27/2015 1202   ALBUMIN 3.6 08/19/2013 0550   AST 16 05/27/2015 1202   ALT 13 05/27/2015 1202   ALKPHOS 72 05/27/2015 1202   BILITOT 0.3 05/27/2015 1202   BILITOT <0.2 03/10/2014 1429   GFRNONAA 124 05/27/2015 1202   GFRAA 143 05/27/2015 1202      ASSESSMENT AND PLAN 40 y.o. year old female  has a past medical history of MS (multiple sclerosis) (HCC) and Bronchitis. here with:  1. Multiple sclerosis  Overall the patient is doing well. She will continue on rebif. I will check blood work today. The patient had a repeat MRI in October 2016 that did  not show any significant change when compared to the previous scan. Patient advised that if she develops any new symptoms or she has worsening of symptoms she should let us know. She will follow-up in 6 months with Dr. Anne Hahn.  Butch Penny, MSN, NP-C 11/24/2015, 11:07 AM [facility] [address], [address] [place], [place] [address] [phone]

## 2015-11-24 NOTE — Progress Notes (Signed)
I have read the note, and I agree with the clinical assessment and plan.  Eriberto Felch KEITH   

## 2015-11-24 NOTE — Patient Instructions (Signed)
Continue on Rebif Blood work today If your symptoms worsen or you develop new symptoms please let us know.

## 2015-11-25 LAB — COMPREHENSIVE METABOLIC PANEL
ALBUMIN: 3.9 g/dL (ref 3.5–5.5)
ALT: 12 IU/L (ref 0–32)
AST: 21 IU/L (ref 0–40)
Albumin/Globulin Ratio: 1 — ABNORMAL LOW (ref 1.2–2.2)
Alkaline Phosphatase: 78 IU/L (ref 39–117)
BILIRUBIN TOTAL: 0.2 mg/dL (ref 0.0–1.2)
BUN / CREAT RATIO: 23 — AB (ref 8–20)
BUN: 14 mg/dL (ref 6–20)
CHLORIDE: 101 mmol/L (ref 96–106)
CO2: 23 mmol/L (ref 18–29)
Calcium: 9.1 mg/dL (ref 8.7–10.2)
Creatinine, Ser: 0.61 mg/dL (ref 0.57–1.00)
GFR, EST AFRICAN AMERICAN: 134 mL/min/{1.73_m2} (ref >59)
GFR, EST NON AFRICAN AMERICAN: 116 mL/min/{1.73_m2} (ref >59)
GLUCOSE: 86 mg/dL (ref 65–99)
Globulin, Total: 3.8 g/dL (ref 1.5–4.5)
Potassium: 4.7 mmol/L (ref 3.5–5.2)
Sodium: 138 mmol/L (ref 134–144)
TOTAL PROTEIN: 7.7 g/dL (ref 6.0–8.5)

## 2015-11-25 LAB — CBC WITH DIFFERENTIAL/PLATELET
BASOS ABS: 0 10*3/uL (ref 0.0–0.2)
BASOS: 0 %
EOS (ABSOLUTE): 0.1 10*3/uL (ref 0.0–0.4)
EOS: 1 %
HEMATOCRIT: 33.9 % — AB (ref 34.0–46.6)
HEMOGLOBIN: 11 g/dL — AB (ref 11.1–15.9)
IMMATURE GRANS (ABS): 0 10*3/uL (ref 0.0–0.1)
Immature Granulocytes: 0 %
LYMPHS ABS: 2 10*3/uL (ref 0.7–3.1)
Lymphs: 39 %
MCH: 25.7 pg — AB (ref 26.6–33.0)
MCHC: 32.4 g/dL (ref 31.5–35.7)
MCV: 79 fL (ref 79–97)
MONOCYTES: 12 %
Monocytes Absolute: 0.6 10*3/uL (ref 0.1–0.9)
NEUTROS ABS: 2.5 10*3/uL (ref 1.4–7.0)
Neutrophils: 48 %
Platelets: 320 10*3/uL (ref 150–379)
RBC: 4.28 x10E6/uL (ref 3.77–5.28)
RDW: 14.3 % (ref 12.3–15.4)
WBC: 5.2 10*3/uL (ref 3.4–10.8)

## 2015-11-29 ENCOUNTER — Telehealth: Payer: Self-pay

## 2015-11-29 NOTE — Telephone Encounter (Signed)
-----   Message from Butch Penny, NP sent at 11/25/2015  2:48 PM EDT ----- Lab work ok. Mild anemia. Please call the patient and fax to PCP.

## 2015-11-29 NOTE — Telephone Encounter (Signed)
I spoke to patient and she is aware of results.  

## 2015-12-30 ENCOUNTER — Ambulatory Visit (INDEPENDENT_AMBULATORY_CARE_PROVIDER_SITE_OTHER): Payer: Medicare Other | Admitting: Adult Health

## 2015-12-30 ENCOUNTER — Other Ambulatory Visit: Payer: Self-pay | Admitting: Adult Health

## 2015-12-30 ENCOUNTER — Telehealth: Payer: Self-pay | Admitting: Adult Health

## 2015-12-30 ENCOUNTER — Encounter: Payer: Self-pay | Admitting: Adult Health

## 2015-12-30 VITALS — BP 118/83 | HR 72 | Ht 64.0 in | Wt 253.8 lb

## 2015-12-30 DIAGNOSIS — H538 Other visual disturbances: Secondary | ICD-10-CM | POA: Diagnosis not present

## 2015-12-30 DIAGNOSIS — G35 Multiple sclerosis: Secondary | ICD-10-CM | POA: Diagnosis not present

## 2015-12-30 MED ORDER — METHYLPREDNISOLONE SODIUM SUCC 500 MG IJ SOLR: INTRAMUSCULAR | Status: DC

## 2015-12-30 NOTE — Telephone Encounter (Signed)
Please get her set up for infusion. IV solumedrol 500 mg x 3 days. She had first infusion in the office but due to insurance she can't have any further infusions.

## 2015-12-30 NOTE — Telephone Encounter (Signed)
Orders placed.

## 2015-12-30 NOTE — Telephone Encounter (Signed)
I  Spoke to [employer] at [facility] at [facility].  Made appt for pt 12-31-15 and 01-03-16 at 0900.  Need hospital orders for the solumedrol  IV .  [facility] cannot do, only at private pay.  This is not covered therapy for medicaid pts per Gavin Pound at [facility].

## 2015-12-30 NOTE — Progress Notes (Signed)
I have read the note, and I agree with the clinical assessment and plan.  Eri Mcevers KEITH   

## 2015-12-30 NOTE — Telephone Encounter (Signed)
I spoke to pt and relayed the appt dates, times and location ([address], adjacent to [facility], there is [employer] representative, but use same parking lot).  I gave her phone # as well.    She asked about her IV access.   She is a hard stick.  I told her to let them know that she is hard stick,  and hopefully will be able to access the IV HL in place per [place] in intrafusion.  Pt verbalized understanding of instructions.

## 2015-12-30 NOTE — Addendum Note (Signed)
Addended by: Enedina Finner on: 12/30/2015 02:42 PM   Modules accepted: Orders

## 2015-12-30 NOTE — Progress Notes (Signed)
PATIENT: Teresa Arnold DOB: 12-15-1975  REASON FOR VISIT: follow up- multiple sclerosis HISTORY FROM: patient  HISTORY OF PRESENT ILLNESS: Teresa Arnold is a 40 year old female with a history multiple sclerosis. She returns today for follow-up. She is currently taking Requip and tolerating it well. She states that she began having a headache last Friday. She also reports blurred vision that is exacerbated when she goes outdoors. She reports photophobia and phonophobia. Denies nausea or vomiting. She states that she has discomfort around the eyes. She states that she also has discomfort when she moves eyes. Denies any flashing lights. She does feel that the left eye is worse. Denies any double vision. Denies any new numbness or weakness. Denies any changes with her gait or balance. Denies any significant fatigue. Denies any changes with the bowels or bladder. She reports that in the past she has had optic neuritis in the left eye and was treated with IV Solu-Medrol. She returns today for an evaluation.  HISTORY 11/24/15 (MM): Teresa Arnold is a 40 year old female with a history of multiple sclerosis. She returns today for follow-up. She is currently taken rebif and tolerating it well. She states that she was having some pain at the injection site but she rotated to the other arm and this has improved. She denies any changes with her gait or balance. She reports that she has intermittent numbness in the right leg. She states that she had this when she was first diagnosed but it had improved however recently she has noticed that again. She reports that she's been having blurry vision and will be receiving new glasses. She denies any changes with her bowels or bladder. She denies any significant fatigue. She returns today for an evaluation.  05/27/15 (MM): Teresa Arnold is a 40 year old female with a history of multiple sclerosis. She returns today for follow-up. She is currently taking Rebif and  tolerating it well. Since the last visit she states that her blurry vision has resolved. Denies any changes with her gait or balance. Denies any new numbness or weakness. No changes in the bowels or bladder. She returns today for an evaluation.   REVIEW OF SYSTEMS: Out of a complete 14 system review of symptoms, the patient complains only of the following symptoms, and all other reviewed systems are negative.  Light sensitivity at pain, blurred vision, dizziness, headache, neck stiffness  ALLERGIES: Allergies  Allergen Reactions  . Sulfa Antibiotics   . Gadolinium Derivatives Nausea And Vomiting    Pt only received half dose of 10ml before getting sick.   [name] [Natalizumab] Rash    HOME MEDICATIONS: Outpatient Prescriptions Prior to Visit  Medication Sig Dispense Refill  . Interferon Beta-1a (REBIF REBIDOSE) 44 MCG/0.5ML SOAJ Inject 44 mcg into the skin 3 (three) times a week. 12 Syringe 3  . valACYclovir (VALTREX) 1000 MG tablet Take 1 tablet (1,000 mg total) by mouth daily. 90 tablet 3   No facility-administered medications prior to visit.    PAST MEDICAL HISTORY: Past Medical History  Diagnosis Date  . Teresa (multiple sclerosis) (HCC)   . Bronchitis     PAST SURGICAL HISTORY: Past Surgical History  Procedure Laterality Date  . Cesarean section      x4    FAMILY HISTORY: Family History  Problem Relation Age of Onset  . Hypertension Mother   . Hypertension Other     SOCIAL HISTORY: Social History   Social History  . Marital Status: Single    Spouse Name:  N/A  . Number of Children: 4  . Years of Education: 14   Occupational History  . Not on file.   Social History Main Topics  . Smoking status: Never Smoker   . Smokeless tobacco: Never Used  . Alcohol Use: 0.0 oz/week    0 Standard drinks or equivalent per week     Comment: ocassional  . Drug Use: No  . Sexual Activity: Yes    Birth Control/ Protection: None   Other Topics Concern  . Not on file     Social History Narrative   Patient is single and lives with her family.   Patient has four children.   Patient has a college education.   Patient is right-handed.   Patient does not drink any caffeine.      PHYSICAL EXAM  Filed Vitals:   12/30/15 0907  BP: 118/83  Pulse: 72  Height: 5\' 4"  (1.626 m)  Weight: 253 lb 12.8 oz (115.123 kg)   Body mass index is 43.54 kg/(m^2).  Generalized: Well developed, in no acute distress   Neurological examination  Mentation: Alert oriented to time, place, history taking. Follows all commands speech and language fluent Cranial nerve II-XII: Pupils were equal round reactive to light. Patient has discomfort moving the eyes to the superior gaze. She has minimal discomfort with horizontal movement.. Facial sensation and strength were normal. Uvula tongue midline. Head turning and shoulder shrug  were normal and symmetric. Motor: The motor testing reveals 5 over 5 strength of all 4 extremities. Good symmetric motor tone is noted throughout.  Sensory: Sensory testing is intact to soft touch on all 4 extremities. No evidence of extinction is noted.  Coordination: Cerebellar testing reveals good finger-nose-finger and heel-to-shin bilaterally.  Gait and station: Gait is normal. Tandem gait is unsteady. Romberg is negative. No drift is seen.  Reflexes: Deep tendon reflexes are symmetric and normal bilaterally.   DIAGNOSTIC DATA (LABS, IMAGING, TESTING) - I reviewed patient records, labs, notes, testing and imaging myself where available.  Lab Results  Component Value Date   WBC 5.2 11/24/2015   HGB 11.7 11/24/2014   HCT 33.9* 11/24/2015   MCV 79 11/24/2015   PLT 320 11/24/2015      Component Value Date/Time   NA 138 11/24/2015 1124   NA 137 08/19/2013 0550   K 4.7 11/24/2015 1124   CL 101 11/24/2015 1124   CO2 23 11/24/2015 1124   GLUCOSE 86 11/24/2015 1124   GLUCOSE 129* 08/19/2013 0550   BUN 14 11/24/2015 1124   BUN 16 08/19/2013  0550   CREATININE 0.61 11/24/2015 1124   CALCIUM 9.1 11/24/2015 1124   PROT 7.7 11/24/2015 1124   PROT 8.4* 08/19/2013 0550   ALBUMIN 3.9 11/24/2015 1124   ALBUMIN 3.6 08/19/2013 0550   AST 21 11/24/2015 1124   ALT 12 11/24/2015 1124   ALKPHOS 78 11/24/2015 1124   BILITOT 0.2 11/24/2015 1124   BILITOT <0.2 03/10/2014 1429   GFRNONAA 116 11/24/2015 1124   GFRAA 134 11/24/2015 1124      ASSESSMENT AND PLAN 40 y.o. year old female  has a past medical history of Teresa (multiple sclerosis) (HCC) and Bronchitis. here with:  1. Multiple sclerosis 2. Blurred vision  The patient is having some changes in her vision. We will check a visual evoked response test. The patient will be started on IV Solu-Medrol 500 mg for 3 days. I will check blood work today. She will remain on Rebif. Patient advised that if  her symptoms worsen or she develops new symptoms she should let us know. She will keep her follow-up in September with Dr. Anne Hahn.  I consulted with Dr. Anne Hahn and he is amenable to this plan.   Butch Penny, MSN, NP-C 12/30/2015, 9:33 AM [facility] [address] [place], [place] [address] [phone]

## 2015-12-30 NOTE — Patient Instructions (Addendum)
Visual test Solu-medrol IV Blood work today If your symptoms worsen or you develop new symptoms please let us know.

## 2015-12-30 NOTE — Telephone Encounter (Signed)
I sent message to [facility] re: this option.  Awaiting answer.

## 2015-12-31 ENCOUNTER — Ambulatory Visit (HOSPITAL_COMMUNITY)
Admission: RE | Admit: 2015-12-31 | Discharge: 2015-12-31 | Disposition: A | Discharge status: Home / Self Care | Payer: Medicare Other | Source: Ambulatory Visit | Attending: Family Medicine | Admitting: Family Medicine

## 2015-12-31 ENCOUNTER — Telehealth: Payer: Self-pay | Admitting: Adult Health

## 2015-12-31 ENCOUNTER — Other Ambulatory Visit: Payer: Self-pay | Admitting: *Deleted

## 2015-12-31 ENCOUNTER — Telehealth: Payer: Self-pay | Admitting: *Deleted

## 2015-12-31 DIAGNOSIS — G35 Multiple sclerosis: Secondary | ICD-10-CM | POA: Diagnosis not present

## 2015-12-31 LAB — COMPREHENSIVE METABOLIC PANEL
A/G RATIO: 1.1 — AB (ref 1.2–2.2)
ALK PHOS: 69 IU/L (ref 39–117)
ALT: 16 IU/L (ref 0–32)
AST: 25 IU/L (ref 0–40)
Albumin: 3.9 g/dL (ref 3.5–5.5)
BUN/Creatinine Ratio: 24 — ABNORMAL HIGH (ref 9–23)
BUN: 14 mg/dL (ref 6–20)
Bilirubin Total: 0.2 mg/dL (ref 0.0–1.2)
CALCIUM: 9.1 mg/dL (ref 8.7–10.2)
CO2: 25 mmol/L (ref 18–29)
Chloride: 99 mmol/L (ref 96–106)
Creatinine, Ser: 0.58 mg/dL (ref 0.57–1.00)
GFR calc Af Amer: 136 mL/min/{1.73_m2} (ref >59)
GFR, EST NON AFRICAN AMERICAN: 118 mL/min/{1.73_m2} (ref >59)
Globulin, Total: 3.5 g/dL (ref 1.5–4.5)
Glucose: 93 mg/dL (ref 65–99)
POTASSIUM: 4.4 mmol/L (ref 3.5–5.2)
Sodium: 138 mmol/L (ref 134–144)
Total Protein: 7.4 g/dL (ref 6.0–8.5)

## 2015-12-31 LAB — CBC WITH DIFFERENTIAL/PLATELET
BASOS ABS: 0 10*3/uL (ref 0.0–0.2)
BASOS: 0 %
EOS (ABSOLUTE): 0.1 10*3/uL (ref 0.0–0.4)
EOS: 2 %
HEMATOCRIT: 34 % (ref 34.0–46.6)
HEMOGLOBIN: 10.9 g/dL — AB (ref 11.1–15.9)
IMMATURE GRANS (ABS): 0 10*3/uL (ref 0.0–0.1)
Immature Granulocytes: 0 %
LYMPHS ABS: 2.1 10*3/uL (ref 0.7–3.1)
LYMPHS: 43 %
MCH: 25.6 pg — AB (ref 26.6–33.0)
MCHC: 32.1 g/dL (ref 31.5–35.7)
MCV: 80 fL (ref 79–97)
MONOCYTES: 18 %
Monocytes Absolute: 0.9 10*3/uL (ref 0.1–0.9)
NEUTROS ABS: 1.8 10*3/uL (ref 1.4–7.0)
Neutrophils: 37 %
Platelets: 333 10*3/uL (ref 150–379)
RBC: 4.25 x10E6/uL (ref 3.77–5.28)
RDW: 12.6 % (ref 12.3–15.4)
WBC: 4.9 10*3/uL (ref 3.4–10.8)

## 2015-12-31 MED ORDER — METHYLPREDNISOLONE SODIUM SUCC 500 MG IJ SOLR: 500.0000 mg | Freq: Once | INTRAMUSCULAR | Status: DC

## 2015-12-31 MED ORDER — METHYLPREDNISOLONE SODIUM SUCC 1000 MG IJ SOLR
500.0000 mg | Freq: Once | INTRAMUSCULAR | Status: AC
  Administered 2015-12-31: 500 mg via INTRAVENOUS
  Filled 2015-12-31: qty 4

## 2015-12-31 NOTE — Discharge Instructions (Signed)
Methylprednisolone Solution for Injection  What is this medicine?  METHYLPREDNISOLONE (meth ill pred NISS oh lone) is a corticosteroid. It is commonly used to treat inflammation of the skin, joints, lungs, and other organs. Common conditions treated include asthma, allergies, and arthritis. It is also used for other conditions, such as blood disorders and diseases of the adrenal glands.  This medicine may be used for other purposes; ask your health care provider or pharmacist if you have questions.  What should I tell my health care provider before I take this medicine?  They need to know if you have any of these conditions:  -cataracts or glaucoma  -Cushing's syndrome  -heart disease  -high blood pressure  -infection including tuberculosis  -low calcium or potassium levels in the blood  -recent surgery  -seizures  -stomach or intestinal disease, including colitis  -threadworms  -thyroid problems  -an unusual or allergic reaction to methylprednisolone, corticosteroids, benzyl alcohol, other medicines, foods, dyes, or preservatives  -pregnant or trying to get pregnant  -breast-feeding  How should I use this medicine?  This medicine is for injection or infusion into a vein. It is also for injection into a muscle. It is given by a health care professional in a hospital or clinic setting.  Talk to your pediatrician regarding the use of this medicine in children. While this drug may be prescribed for selected conditions, precautions do apply.  Overdosage: If you think you have taken too much of this medicine contact a poison control center or emergency room at once.  NOTE: This medicine is only for you. Do not share this medicine with others.  What if I miss a dose?  This does not apply.  What may interact with this medicine?  Do not take this medicine with any of the following medications:  -mifepristone  This medicine may also interact with the following medications:  -aspirin and aspirin-like  medicines  -cyclosporin  -ketoconazole  -phenobarbital  -phenytoin  -rifampin  -tacrolimus  -troleandomycin  -vaccines  -warfarin  This list may not describe all possible interactions. Give your health care provider a list of all the medicines, herbs, non-prescription drugs, or dietary supplements you use. Also tell them if you smoke, drink alcohol, or use illegal drugs. Some items may interact with your medicine.  What should I watch for while using this medicine?  Visit your doctor or health care professional for regular checks on your progress. If you are taking this medicine for a long time, carry an identification card with your name and address, the type and dose of your medicine, and your doctor's name and address.  The medicine may increase your risk of getting an infection. Stay away from people who are sick. Tell your doctor or health care professional if you are around anyone with measles or chickenpox.  You may need to avoid some vaccines. Talk to your health care provider for more information.  If you are going to have surgery, tell your doctor or health care professional that you have taken this medicine within the last twelve months.  Ask your doctor or health care professional about your diet. You may need to lower the amount of salt you eat.  The medicine can increase your blood sugar. If you are a diabetic check with your doctor if you need help adjusting the dose of your diabetic medicine.  What side effects may I notice from receiving this medicine?  Side effects that you should report to your doctor or health care   professional as soon as possible:  -allergic reactions like skin rash, itching or hives, swelling of the face, lips, or tongue  -bloody or tarry stools  -changes in vision  -eye pain or bulging eyes  -fever, sore throat, sneezing, cough, or other signs of infection, wounds that will not heal  -increased thirst  -irregular heartbeat  -muscle cramps  -pain in hips, back, ribs, arms,  shoulders, or legs  -swelling of the ankles, feet, hands  -trouble passing urine or change in the amount of urine  -unusual bleeding or bruising  -unusually weak or tired  -weight gain or weight loss  Side effects that usually do not require medical attention (report to your doctor or health care professional if they continue or are bothersome):  -changes in emotions or moods  -constipation or diarrhea  -headache  -irritation at site where injected  -nausea, vomiting  -skin problems, acne, thin and shiny skin  -trouble sleeping  -unusual hair growth on the face or body  This list may not describe all possible side effects. Call your doctor for medical advice about side effects. You may report side effects to FDA at 1-800-FDA-1088.  Where should I keep my medicine?  This drug is given in a hospital or clinic and will not be stored at home.  NOTE: This sheet is a summary. It may not cover all possible information. If you have questions about this medicine, talk to your doctor, pharmacist, or health care provider.      2016, Elsevier/Gold Standard. (2012-05-28 11:37:16)

## 2015-12-31 NOTE — Telephone Encounter (Signed)
I called the patient. Her lab work is unremarkable. She reports that after 2 doses of Solu-Medrol her headache has resolved. She is no longer having any discomfort with the eyes. Denies photophobia. We will continue to monitor her symptoms. We are awaiting the visual evoked response test

## 2015-12-31 NOTE — Progress Notes (Signed)
Patient arrived to [facility] for IV infusion of Solu-medrol.  Patient arrived with IV in already in place, and patient refuses to be stuck again.  IV in place to left lower arm. Flushed without difficulty.

## 2015-12-31 NOTE — Telephone Encounter (Signed)
Tiffany called from [facility] needing orders for solumedrol IV.  She see's them in system but cannot release.

## 2016-01-03 ENCOUNTER — Ambulatory Visit (HOSPITAL_COMMUNITY)
Admission: RE | Admit: 2016-01-03 | Discharge: 2016-01-03 | Disposition: A | Discharge status: Home / Self Care | Payer: Medicare Other | Source: Ambulatory Visit | Attending: Family Medicine | Admitting: Family Medicine

## 2016-01-03 DIAGNOSIS — G35 Multiple sclerosis: Secondary | ICD-10-CM | POA: Diagnosis not present

## 2016-01-03 MED ORDER — METHYLPREDNISOLONE SODIUM SUCC 500 MG IJ SOLR: 500.0000 mg | INTRAMUSCULAR | Status: DC | PRN

## 2016-01-03 MED ORDER — SODIUM CHLORIDE 0.9 % IV SOLN
500.0000 mg | Freq: Once | INTRAVENOUS | Status: AC
  Administered 2016-01-03: 500 mg via INTRAVENOUS
  Filled 2016-01-03: qty 4

## 2016-01-03 NOTE — Progress Notes (Signed)
Pt received IV infusion of solumedrol per previous electronic order; IV in place upon arrival; infusion completed; no complications noted; IV removed before discharge  Diagnosis: Multiple sclerosis

## 2016-01-03 NOTE — Progress Notes (Signed)
Arrived for treatment with IV in place in left forearm from last week. Blood noted in first part of tubing. Flushed with normal saline without swelling. No redness at site.

## 2016-01-27 ENCOUNTER — Telehealth: Payer: Self-pay | Admitting: Neurology

## 2016-01-27 NOTE — Telephone Encounter (Signed)
error 

## 2016-02-24 ENCOUNTER — Ambulatory Visit (INDEPENDENT_AMBULATORY_CARE_PROVIDER_SITE_OTHER): Payer: Medicare Other | Admitting: Diagnostic Neuroimaging

## 2016-02-24 DIAGNOSIS — G35 Multiple sclerosis: Secondary | ICD-10-CM

## 2016-02-24 DIAGNOSIS — H469 Unspecified optic neuritis: Secondary | ICD-10-CM

## 2016-03-03 ENCOUNTER — Telehealth: Payer: Self-pay | Admitting: Neurology

## 2016-03-03 NOTE — Telephone Encounter (Signed)
I called patient. The visual evoked response test shows absence of response on the left, prolongation of the right, but in comparison to a prior study done on 11/27/2014, there is essentially no change on this study.

## 2016-03-03 NOTE — Procedures (Signed)
    History:   Teresa Arnold is a 40 year old patient with a history of multiple sclerosis with a history of optic neuritis that has affected both eyes, left greater than right. The patient has reported some visual changes that began in April 2017, she comes in for a repeat visual over spontaneous test as a comparison to a prior study.   Description: The visual evoked response test was performed today using 32 x 32 check sizes for the study on the right, the studies done on the left using 32 x 32, 16 x 16, 8 x 8, and 4 x 4 check sizes. The absolute latencies for the N1 and the P100 wave forms were significant prolonged on the right, with an N1 of 102.0, P100 of 139.8. The amplitudes for the P100 wave forms were within normal limits on the right. On the left side, no response was seen with multiple attempts. The visual acuity was 20/30 OD and 20/50 OS corrected.  Impression:  The visual evoked response test above was abnormal bilaterally. The P100 latency on the right was significantly prolonged, no response was seen on the left. This study suggests bilateral involvement of the anterior visual pathways involving either the optic nerves bilaterally or the optic nerve chiasm as a single lesion. In comparison to a prior study done on 11/27/2014, this study shows very similar findings, essentially the study is unchanged.

## 2016-03-08 ENCOUNTER — Telehealth: Payer: Self-pay

## 2016-03-08 MED ORDER — INTERFERON BETA-1A 44 MCG/0.5ML ~~LOC~~ SOAJ: 44.0000 ug | SUBCUTANEOUS | Status: DC

## 2016-03-08 NOTE — Telephone Encounter (Signed)
Received faxed request for authorization of refills. Retailed to [facility] x 1 yr.

## 2016-05-31 ENCOUNTER — Encounter: Payer: Self-pay | Admitting: Neurology

## 2016-05-31 ENCOUNTER — Ambulatory Visit (INDEPENDENT_AMBULATORY_CARE_PROVIDER_SITE_OTHER): Payer: Medicare Other | Admitting: Neurology

## 2016-05-31 VITALS — BP 124/82 | HR 72 | Ht 64.5 in | Wt 252.2 lb

## 2016-05-31 DIAGNOSIS — G35 Multiple sclerosis: Secondary | ICD-10-CM | POA: Diagnosis not present

## 2016-05-31 NOTE — Progress Notes (Signed)
Reason for visit: Multiple sclerosis  Teresa Arnold is an 40 y.o. female  History of present illness:  Teresa Arnold is a 40 year old right-handed black female with a history of multiple sclerosis. The patient has been on Rebif, she has tolerated the medication well. She reports ongoing issues with her vision, particularly with the left eye. She has had a visual evoked response test done in June 2017 that showed good stability from a prior study done a year and a half prior. The patient has no response on the left, prolongation on the right side. The patient reports some difficulty with driving a car or with reading. Heat exposure will worsen the vision. The patient reports no other new symptoms of numbness or weakness on the face, arms, or legs. The patient denies any change in balance or difficulty controlling the bowels or the bladder. She has had blood work done on her last visit. She returns for an evaluation.  Past Medical History:  Diagnosis Date  . Bronchitis   . MS (multiple sclerosis) (HCC)     Past Surgical History:  Procedure Laterality Date  . CESAREAN SECTION     x4    Family History  Problem Relation Age of Onset  . Hypertension Mother   . Hypertension Other     Social history:  reports that she has never smoked. She has never used smokeless tobacco. She reports that she drinks alcohol. She reports that she does not use drugs.    Allergies  Allergen Reactions  . Sulfa Antibiotics   . Gadolinium Derivatives Nausea And Vomiting    Pt only received half dose of 10ml before getting sick.   [name] [Natalizumab] Rash    Medications:  Prior to Admission medications   Medication Sig Start Date End Date Taking? Authorizing Provider  acetaminophen (TYLENOL) 325 MG tablet Take 650 mg by mouth every 6 (six) hours as needed.   Yes Historical Provider, MD  Interferon Beta-1a (REBIF REBIDOSE) 44 MCG/0.5ML SOAJ Inject 44 mcg into the skin 3 (three) times a week.  03/08/16  Yes York Spaniel, MD  valACYclovir (VALTREX) 1000 MG tablet Take 1 tablet (1,000 mg total) by mouth daily. 04/13/15  Yes Henrietta Hoover, NP    ROS:  Out of a complete 14 system review of symptoms, the patient complains only of the following symptoms, and all other reviewed systems are negative.  Chills Blurred vision Restless legs  Blood pressure 124/82, pulse 72, height 5' 4.5" (1.638 m), weight 252 lb 4 oz (114.4 kg).  Physical Exam  General: The patient is alert and cooperative at the time of the examination. The patient is markedly obese.  Skin: No significant peripheral edema is noted.   Neurologic Exam  Mental status: The patient is alert and oriented x 3 at the time of the examination. The patient has apparent normal recent and remote memory, with an apparently normal attention span and concentration ability.   Cranial nerves: Facial symmetry is present. Speech is normal, no aphasia or dysarthria is noted. Extraocular movements are full. Visual fields are full. Pupils are equal, round, and reactive to light. Discs are flat bilaterally.  Motor: The patient has good strength in all 4 extremities.  Sensory examination: Soft touch sensation is symmetric on the face, arms, and legs.  Coordination: The patient has good finger-nose-finger and heel-to-shin bilaterally.  Gait and station: The patient has a normal gait. Tandem gait is slightly unsteady. Romberg is negative. No drift is  seen.  Reflexes: Deep tendon reflexes are symmetric.   MRI brain 06/15/15:  IMPRESSION:  Abnormal MRI brain (with and without) demonstrating: 1. Multiple periventricular and subcortical and infratentorial chronic demyelinating plaques. Some of these are hypointense on T1 views.  2. No significant change from MRI on 02/27/14.   * MRI scan images were reviewed online. I agree with the written report.    Assessment/Plan:  1. Multiple sclerosis  2. Visual disturbance, left  greater than right optic neuritis  The patient will continue the Rebif. MRI of the brain was done about a year ago and appeared to show good stability. The patient will follow-up in about 6 months. She will contact our office if any new issues arise.  Marlan Palau. Keith Waymon Laser MD 05/31/2016 10:23 AM  [facility] [address] [place], [place] [id number]  Phone [phone] Fax [phone]

## 2016-06-02 LAB — HM PAP SMEAR

## 2016-10-14 ENCOUNTER — Encounter (HOSPITAL_COMMUNITY): Payer: Self-pay | Admitting: *Deleted

## 2016-10-14 ENCOUNTER — Other Ambulatory Visit: Payer: Self-pay | Admitting: Family Medicine

## 2016-10-14 ENCOUNTER — Ambulatory Visit (HOSPITAL_COMMUNITY)
Admission: EM | Admit: 2016-10-14 | Discharge: 2016-10-14 | Disposition: A | Discharge status: Home / Self Care | Payer: Medicare Other | Attending: Family Medicine | Admitting: Family Medicine

## 2016-10-14 DIAGNOSIS — L239 Allergic contact dermatitis, unspecified cause: Secondary | ICD-10-CM

## 2016-10-14 MED ORDER — PREDNISONE 10 MG PO TABS: 20.0000 mg | ORAL_TABLET | Freq: Every day | ORAL | 0 refills | Status: AC

## 2016-10-14 NOTE — ED Provider Notes (Signed)
CSN: [id number]     Arrival date & time 10/14/16  1218 History   First MD Initiated Contact with Patient 10/14/16 1356     Chief Complaint  Patient presents with  . Urticaria   (Consider location/radiation/quality/duration/timing/severity/associated sxs/prior Treatment) The history is provided by the patient.  Rash  Location:  Full body Quality: itchiness and redness   Severity:  Moderate Onset quality:  Sudden Duration:  2 days Timing:  Constant Progression:  Worsening Chronicity:  New Context: not animal contact, not food, not hot tub use, not medications, not new detergent/soap and not plant contact   Worsened by:  Nothing Ineffective treatments: Tried vaseline with no relief.   Past Medical History:  Diagnosis Date  . Bronchitis   . MS (multiple sclerosis) (HCC)    Past Surgical History:  Procedure Laterality Date  . CESAREAN SECTION     x4   Family History  Problem Relation Age of Onset  . Hypertension Mother   . Hypertension Other    Social History  Substance Use Topics  . Smoking status: Never Smoker  . Smokeless tobacco: Never Used  . Alcohol use 0.0 oz/week     Comment: occasionally   OB History    Gravida Para Term Preterm AB Living   5 4 4   1 4    SAB TAB Ectopic Multiple Live Births   1             Review of Systems  Skin: Positive for rash.  All other systems reviewed and are negative.   Allergies  Sulfa antibiotics; Gadolinium derivatives; and Tysabri [natalizumab]  Home Medications   Prior to Admission medications   Medication Sig Start Date End Date Taking? Authorizing Provider  Interferon Beta-1a (REBIF REBIDOSE) 44 MCG/0.5ML SOAJ Inject 44 mcg into the skin 3 (three) times a week. 03/08/16  Yes York Spaniel, MD  valACYclovir (VALTREX) 1000 MG tablet Take 1 tablet (1,000 mg total) by mouth daily. 04/13/15  Yes Henrietta Hoover, NP  acetaminophen (TYLENOL) 325 MG tablet Take 650 mg by mouth every 6 (six) hours as needed.    Historical  Provider, MD   Meds Ordered and Administered this Visit  Medications - No data to display  BP 126/73 (BP Location: Left Arm)   Pulse 74   Temp 98.7 F (37.1 C) (Oral)   Resp 16   LMP 10/10/2016 (Exact Date)   SpO2 100%  No data found.   Physical Exam  Constitutional: She appears well-developed and well-nourished.  Cardiovascular: Normal rate and regular rhythm.   Pulmonary/Chest: Effort normal and breath sounds normal.  Skin:  Erythematous and confluence Urticaria rash noted on chest, arms, abdomen, back, and bilateral thighs. Patient scratching the rash in room   Nursing note and vitals reviewed.   Urgent Care Course     Procedures (including critical care time)  Labs Review Labs Reviewed - No data to display  Imaging Review No results found.  MDM   1. Allergic contact dermatitis, unspecified trigger    1) Start 6 day prednisone taper. 2) Start Benadryl for itchiness. 3) Please follow up primary care doctor if you noticed no improvement. 4) Please avoid any known trigger.    Lucia Estelle, NP 10/14/16 1407

## 2016-10-14 NOTE — ED Triage Notes (Signed)
Reports pruritic rash to entire body since yesterday.  Has not taken any meds; has applied Vaseline.

## 2016-11-11 ENCOUNTER — Ambulatory Visit (HOSPITAL_COMMUNITY)
Admission: EM | Admit: 2016-11-11 | Discharge: 2016-11-11 | Disposition: A | Discharge status: Home / Self Care | Payer: Medicare Other | Attending: Emergency Medicine | Admitting: Emergency Medicine

## 2016-11-11 ENCOUNTER — Encounter (HOSPITAL_COMMUNITY): Payer: Self-pay | Admitting: Family Medicine

## 2016-11-11 DIAGNOSIS — T7840XD Allergy, unspecified, subsequent encounter: Secondary | ICD-10-CM | POA: Diagnosis not present

## 2016-11-11 MED ORDER — PREDNISONE 50 MG PO TABS: ORAL_TABLET | ORAL | 0 refills | Status: DC

## 2016-11-11 NOTE — ED Provider Notes (Signed)
CSN: [id number]     Arrival date & time 11/11/16  1433 History   None    Chief Complaint  Patient presents with  . Allergic Reaction   (Consider location/radiation/quality/duration/timing/severity/associated sxs/prior Treatment) 41 year old female complaining of a chief complaint of allergic reaction to her tampon. She states she had a similar reaction one month ago was treated with prednisone and Benadryl. She is the same brand this month states she has broken out with hives, and itchiness, she denies any shortness of breath, wheezing, or other symptoms. Patient is rather short during the interview, frequently stating that her symptoms are the same as the last time in the same and she told the nurse prior she is declining a physical exam.   The history is provided by the patient.  Allergic Reaction    Past Medical History:  Diagnosis Date  . Bronchitis   . MS (multiple sclerosis) (HCC)    Past Surgical History:  Procedure Laterality Date  . CESAREAN SECTION     x4   Family History  Problem Relation Age of Onset  . Hypertension Mother   . Hypertension Other    Social History  Substance Use Topics  . Smoking status: Never Smoker  . Smokeless tobacco: Never Used  . Alcohol use 0.0 oz/week     Comment: occasionally   OB History    Gravida Para Term Preterm AB Living   5 4 4   1 4    SAB TAB Ectopic Multiple Live Births   1             Review of Systems  Reason unable to perform ROS: As covered in history of present illness.  All other systems reviewed and are negative.   Allergies  Sulfa antibiotics; Gadolinium derivatives; and Tysabri [natalizumab]  Home Medications   Prior to Admission medications   Medication Sig Start Date End Date Taking? Authorizing Provider  acetaminophen (TYLENOL) 325 MG tablet Take 650 mg by mouth every 6 (six) hours as needed.    Historical Provider, MD  Interferon Beta-1a (REBIF REBIDOSE) 44 MCG/0.5ML SOAJ Inject 44 mcg into the skin 3  (three) times a week. 03/08/16   York Spaniel, MD  predniSONE (DELTASONE) 50 MG tablet Take 1 tablet daily with food 11/11/16   Dorena Bodo, NP  valACYclovir (VALTREX) 1000 MG tablet Take 1 tablet (1,000 mg total) by mouth daily. 04/13/15   Henrietta Hoover, NP   Meds Ordered and Administered this Visit  Medications - No data to display  BP (!) 94/51   Pulse 98   Temp 98.2 F (36.8 C)   Resp 18   LMP 11/11/2016   SpO2 99%  No data found.   Physical Exam  Constitutional: She is oriented to person, place, and time. She appears well-developed and well-nourished. No distress.  HENT:  Head: Normocephalic and atraumatic.  Genitourinary:  Genitourinary Comments: Patient refused an exam  Neurological: She is alert and oriented to person, place, and time.  Skin: Skin is warm and dry. Capillary refill takes less than 2 seconds. She is not diaphoretic.  Psychiatric: She has a normal mood and affect.  Nursing note and vitals reviewed.   Urgent Care Course     Procedures (including critical care time)  Labs Review Labs Reviewed - No data to display  Imaging Review No results found.      MDM   1. Allergic reaction, subsequent encounter    For your allergic reaction, prescribed prednisone, take one tablet  daily with food. I also recommend taking over-the-counter Benadryl, 2 tablets every 6 hours not over 300 mg any 24-hour period, or nonsedating option Claritin, or Allegra, one tablet twice a day for 5 days. Should your symptoms persist, follow up with her primary care provider or return to clinic as needed, also this discontinue the use of your tampons.      Dorena Bodo, NP 11/11/16 2106

## 2016-11-11 NOTE — ED Triage Notes (Signed)
Pt here for allergic reaction to possibly tampons. sts that she had the same thing last month during her period.

## 2016-11-11 NOTE — Discharge Instructions (Addendum)
For your allergic reaction, prescribed prednisone, take one tablet daily with food. I also recommend taking over-the-counter Benadryl, 2 tablets every 6 hours not over 300 mg any 24-hour period, or nonsedating option Claritin, or Allegra, one tablet twice a day for 5 days. Should your symptoms persist, follow up with her primary care provider or return to clinic as needed, also this discontinue the use of your tampons.

## 2016-11-11 NOTE — ED Notes (Signed)
Have went in three times to update patient about delay of provider not coming in. Gave patient a warm blanket.

## 2016-11-13 ENCOUNTER — Emergency Department (HOSPITAL_COMMUNITY): Payer: Medicare Other

## 2016-11-13 ENCOUNTER — Emergency Department (HOSPITAL_COMMUNITY)
Admission: EM | Admit: 2016-11-13 | Discharge: 2016-11-14 | Disposition: A | Discharge status: Home / Self Care | Payer: Medicare Other | Attending: Emergency Medicine | Admitting: Emergency Medicine

## 2016-11-13 ENCOUNTER — Encounter (HOSPITAL_COMMUNITY): Payer: Self-pay | Admitting: Emergency Medicine

## 2016-11-13 DIAGNOSIS — R1013 Epigastric pain: Secondary | ICD-10-CM | POA: Diagnosis present

## 2016-11-13 DIAGNOSIS — R21 Rash and other nonspecific skin eruption: Secondary | ICD-10-CM | POA: Insufficient documentation

## 2016-11-13 DIAGNOSIS — R1084 Generalized abdominal pain: Secondary | ICD-10-CM | POA: Insufficient documentation

## 2016-11-13 DIAGNOSIS — R112 Nausea with vomiting, unspecified: Secondary | ICD-10-CM | POA: Diagnosis not present

## 2016-11-13 DIAGNOSIS — R197 Diarrhea, unspecified: Secondary | ICD-10-CM | POA: Insufficient documentation

## 2016-11-13 DIAGNOSIS — E876 Hypokalemia: Secondary | ICD-10-CM | POA: Diagnosis not present

## 2016-11-13 DIAGNOSIS — R109 Unspecified abdominal pain: Secondary | ICD-10-CM

## 2016-11-13 LAB — COMPREHENSIVE METABOLIC PANEL
ALT: 15 U/L (ref 14–54)
ANION GAP: 8 (ref 5–15)
AST: 21 U/L (ref 15–41)
Albumin: 3.4 g/dL — ABNORMAL LOW (ref 3.5–5.0)
Alkaline Phosphatase: 57 U/L (ref 38–126)
BUN: 12 mg/dL (ref 6–20)
CHLORIDE: 106 mmol/L (ref 101–111)
CO2: 24 mmol/L (ref 22–32)
Calcium: 8.6 mg/dL — ABNORMAL LOW (ref 8.9–10.3)
Creatinine, Ser: 0.63 mg/dL (ref 0.44–1.00)
Glucose, Bld: 117 mg/dL — ABNORMAL HIGH (ref 65–99)
POTASSIUM: 3.2 mmol/L — AB (ref 3.5–5.1)
Sodium: 138 mmol/L (ref 135–145)
Total Bilirubin: 0.4 mg/dL (ref 0.3–1.2)
Total Protein: 7.1 g/dL (ref 6.5–8.1)

## 2016-11-13 LAB — CBC
HEMATOCRIT: 35.1 % — AB (ref 36.0–46.0)
HEMOGLOBIN: 10.9 g/dL — AB (ref 12.0–15.0)
MCH: 24.7 pg — ABNORMAL LOW (ref 26.0–34.0)
MCHC: 31.1 g/dL (ref 30.0–36.0)
MCV: 79.4 fL (ref 78.0–100.0)
Platelets: 386 10*3/uL (ref 150–400)
RBC: 4.42 MIL/uL (ref 3.87–5.11)
RDW: 13.5 % (ref 11.5–15.5)
WBC: 10.4 10*3/uL (ref 4.0–10.5)

## 2016-11-13 LAB — I-STAT BETA HCG BLOOD, ED (MC, WL, AP ONLY)

## 2016-11-13 LAB — URINALYSIS, ROUTINE W REFLEX MICROSCOPIC
BILIRUBIN URINE: NEGATIVE
Glucose, UA: NEGATIVE mg/dL
HGB URINE DIPSTICK: NEGATIVE
KETONES UR: 5 mg/dL — AB
Leukocytes, UA: NEGATIVE
Nitrite: NEGATIVE
Protein, ur: 30 mg/dL — AB
Specific Gravity, Urine: 1.034 — ABNORMAL HIGH (ref 1.005–1.030)
pH: 5 (ref 5.0–8.0)

## 2016-11-13 LAB — LIPASE, BLOOD: LIPASE: 11 U/L (ref 11–51)

## 2016-11-13 MED ORDER — IOPAMIDOL (ISOVUE-300) INJECTION 61%
INTRAVENOUS | Status: AC
  Filled 2016-11-13: qty 30

## 2016-11-13 MED ORDER — SODIUM CHLORIDE 0.9 % IV BOLUS (SEPSIS)
1000.0000 mL | Freq: Once | INTRAVENOUS | Status: AC
  Administered 2016-11-13: 1000 mL via INTRAVENOUS

## 2016-11-13 MED ORDER — MORPHINE SULFATE (PF) 4 MG/ML IV SOLN
4.0000 mg | Freq: Once | INTRAVENOUS | Status: AC
  Administered 2016-11-13: 4 mg via INTRAVENOUS
  Filled 2016-11-13: qty 1

## 2016-11-13 MED ORDER — METOCLOPRAMIDE HCL 5 MG/ML IJ SOLN
10.0000 mg | Freq: Once | INTRAMUSCULAR | Status: AC
  Administered 2016-11-13: 10 mg via INTRAVENOUS
  Filled 2016-11-13: qty 2

## 2016-11-13 NOTE — ED Notes (Signed)
Pt transported to XR.  

## 2016-11-13 NOTE — ED Triage Notes (Signed)
Pt sts abd pain x 2 days

## 2016-11-13 NOTE — ED Provider Notes (Signed)
[facility]-EMERGENCY DEPT Provider Note   CSN: [id number] Arrival date & time: 11/13/16  1534     History   Chief Complaint Chief Complaint  Patient presents with  . Abdominal Pain    HPI Teresa Arnold is a 41 y.o. female.  HPI Complains of lower abdominal pain and epigastric pain onset 2 days ago. Pain is not made better or worse by anything, pain is constant, waxes and wanes, she is treated herself with Tylenol, without relief. She's had multiple episodes of nonbloody emesis since onset of pain and multiple episodes of diarrhea since onset of pain. She denies any fever. She was seen 2 days ago for allergic reaction, started on prednisone, which she did not take today. Chief thought she was allergic to a tampon. Her last normal menstrual period ended 2 days ago. He denies urinary symptoms denies vaginal discharge denies other associated symptoms. She is presently hungry though "scared to eat" Past Medical History:  Diagnosis Date  . Bronchitis   . MS (multiple sclerosis) [facility])     Patient Active Problem List   Diagnosis Date Noted  . 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 Multiple sclerosis (HCC) 02/13/2013    Past Surgical History:  Procedure Laterality Date  . CESAREAN SECTION     x4    OB History    Gravida Para Term Preterm AB Living   5 4 4   1 4    SAB TAB Ectopic Multiple Live Births   1             Gravida 7 para 49 with 4 term cesarean sections and 3 miscarriages  Home Medications    Prior to Admission medications   Medication Sig Start Date End Date Taking? Authorizing Provider  acetaminophen (TYLENOL) 325 MG tablet Take 650 mg by mouth every 6 (six) hours as needed.   Yes Historical Provider, MD  Interferon Beta-1a (REBIF REBIDOSE) 44 MCG/0.5ML SOAJ Inject 44 mcg into the skin 3 (three) times a week. Patient taking differently: Inject 44 mcg into the skin every Monday, Wednesday, and Friday.  03/08/16  Yes York Spaniel, MD  predniSONE (DELTASONE) 50 MG  tablet Take 1 tablet daily with food 11/11/16   Dorena Bodo, NP  valACYclovir (VALTREX) 1000 MG tablet Take 1 tablet (1,000 mg total) by mouth daily. 04/13/15   Henrietta Hoover, NP    Family History Family History  Problem Relation Age of Onset  . Hypertension Mother   . Hypertension Other     Social History Social History  Substance Use Topics  . Smoking status: Never Smoker  . Smokeless tobacco: Never Used  . Alcohol use 0.0 oz/week     Comment: occasionally     Allergies   Gadolinium derivatives; Sulfa antibiotics; and Tysabri [natalizumab]   Review of Systems Review of Systems  Constitutional: Negative.   HENT: Negative.   Respiratory: Negative.   Cardiovascular: Negative.   Gastrointestinal: Positive for abdominal pain, diarrhea and vomiting.  Musculoskeletal: Negative.   Skin: Positive for rash.       Rash from allergy 2 days ago which is steadily improving  Neurological: Negative.   Psychiatric/Behavioral: Negative.   All other systems reviewed and are negative.    Physical Exam Updated Vital Signs BP 133/85 (BP Location: Right Arm)   Pulse 77   Temp 98.3 F (36.8 C) (Oral)   Resp 19   LMP 11/11/2016   SpO2 100%   Physical Exam  Constitutional: She appears well-developed and well-nourished.  HENT:  Head: Normocephalic and atraumatic.  Eyes: Conjunctivae are normal. Pupils are equal, round, and reactive to light.  Neck: Neck supple. No tracheal deviation present. No thyromegaly present.  Cardiovascular: Normal rate and regular rhythm.   No murmur heard. Pulmonary/Chest: Effort normal and breath sounds normal.  Abdominal: Soft. Bowel sounds are normal. She exhibits no distension. There is no tenderness.  Obese, mild diffuse tenderness  Musculoskeletal: Normal range of motion. She exhibits no edema or tenderness.  Neurological: She is alert. Coordination normal.  Skin: Skin is warm and dry. Rash noted.  Faint Pinkish rash on abdomen and on arms  involving palms or soles  Psychiatric: She has a normal mood and affect.  Nursing note and vitals reviewed.    ED Treatments / Results  Labs (all labs ordered are listed, but only abnormal results are displayed) Labs Reviewed  COMPREHENSIVE METABOLIC PANEL - Abnormal; Notable for the following:       Result Value   Potassium 3.2 (*)    Glucose, Bld 117 (*)    Calcium 8.6 (*)    Albumin 3.4 (*)    All other components within normal limits  CBC - Abnormal; Notable for the following:    Hemoglobin 10.9 (*)    HCT 35.1 (*)    MCH 24.7 (*)    All other components within normal limits  LIPASE, BLOOD  URINALYSIS, ROUTINE W REFLEX MICROSCOPIC  I-STAT BETA HCG BLOOD, ED ([facility], [facility], [facility] ONLY)    EKG  EKG Interpretation None      X-rays viewed by me Results for orders placed or performed during the hospital encounter of 11/13/16  Lipase, blood  Result Value Ref Range   Lipase 11 11 - 51 U/L  Comprehensive metabolic panel  Result Value Ref Range   Sodium 138 135 - 145 mmol/L   Potassium 3.2 (L) 3.5 - 5.1 mmol/L   Chloride 106 101 - 111 mmol/L   CO2 24 22 - 32 mmol/L   Glucose, Bld 117 (H) 65 - 99 mg/dL   BUN 12 6 - 20 mg/dL   Creatinine, Ser 1.56 0.44 - 1.00 mg/dL   Calcium 8.6 (L) 8.9 - 10.3 mg/dL   Total Protein 7.1 6.5 - 8.1 g/dL   Albumin 3.4 (L) 3.5 - 5.0 g/dL   AST 21 15 - 41 U/L   ALT 15 14 - 54 U/L   Alkaline Phosphatase 57 38 - 126 U/L   Total Bilirubin 0.4 0.3 - 1.2 mg/dL   GFR calc non Af Amer >60 >60 mL/min   GFR calc Af Amer >60 >60 mL/min   Anion gap 8 5 - 15  CBC  Result Value Ref Range   WBC 10.4 4.0 - 10.5 K/uL   RBC 4.42 3.87 - 5.11 MIL/uL   Hemoglobin 10.9 (L) 12.0 - 15.0 g/dL   HCT 15.3 (L) 79.4 - 32.7 %   MCV 79.4 78.0 - 100.0 fL   MCH 24.7 (L) 26.0 - 34.0 pg   MCHC 31.1 30.0 - 36.0 g/dL   RDW 61.4 70.9 - 29.5 %   Platelets 386 150 - 400 K/uL  Urinalysis, Routine w reflex microscopic  Result Value Ref Range   Color, Urine YELLOW YELLOW    APPearance HAZY (A) CLEAR   Specific Gravity, Urine 1.034 (H) 1.005 - 1.030   pH 5.0 5.0 - 8.0   Glucose, UA NEGATIVE NEGATIVE mg/dL   Hgb urine dipstick NEGATIVE NEGATIVE   Bilirubin Urine NEGATIVE NEGATIVE  Ketones, ur 5 (A) NEGATIVE mg/dL   Protein, ur 30 (A) NEGATIVE mg/dL   Nitrite NEGATIVE NEGATIVE   Leukocytes, UA NEGATIVE NEGATIVE   RBC / HPF 0-5 0 - 5 RBC/hpf   WBC, UA 0-5 0 - 5 WBC/hpf   Bacteria, UA RARE (A) NONE SEEN   Squamous Epithelial / LPF 0-5 (A) NONE SEEN   Mucous PRESENT   I-Stat beta hCG blood, ED  Result Value Ref Range   I-stat hCG, quantitative <5.0 <5 mIU/mL   Comment 3           Dg Abd Acute W/chest  Result Date: 11/13/2016 CLINICAL DATA:  Generalized abdominal pain. EXAM: DG ABDOMEN ACUTE W/ 1V CHEST COMPARISON:  10/26/2010 FINDINGS: Both lungs are clear. Heart and mediastinum are within normal limits. Negative for free air. Nonobstructive bowel gas pattern. Few phleboliths in the pelvis. No acute bone abnormality. IMPRESSION: Negative abdominal radiographs.  No acute cardiopulmonary disease. Electronically Signed   By: Richarda Overlie M.D.   On: 11/13/2016 21:34   Radiology No results found.  Procedures Procedures (including critical care time)  Medications Ordered in ED Medications  sodium chloride 0.9 % bolus 1,000 mL (not administered)     Initial Impression / Assessment and Plan / ED Course  I have reviewed the triage vital signs and the nursing notes.  Pertinent labs & imaging results that were available during my care of the patient were reviewed by me and considered in my medical decision making (see chart for details).     11:10 PM pain and nausea improved after treatment with intravenous morphine and Reglan. Pt signed out to Dr Preston Fleeting 1220 am  Final Clinical Impressions(s) / ED Diagnoses  Diagnosis #1 generalized abdominal pain #2 nausea vomiting diarrhea #3 hypokalemia Final diagnoses:  None    New Prescriptions New Prescriptions     No medications on file     Doug Sou, MD 11/14/16 0025

## 2016-11-14 ENCOUNTER — Emergency Department (HOSPITAL_COMMUNITY): Payer: Medicare Other

## 2016-11-14 DIAGNOSIS — R109 Unspecified abdominal pain: Secondary | ICD-10-CM | POA: Diagnosis not present

## 2016-11-14 DIAGNOSIS — R1084 Generalized abdominal pain: Secondary | ICD-10-CM | POA: Diagnosis not present

## 2016-11-14 MED ORDER — TRAMADOL HCL 50 MG PO TABS: 50.0000 mg | ORAL_TABLET | Freq: Four times a day (QID) | ORAL | 0 refills | Status: DC | PRN

## 2016-11-14 MED ORDER — DEXAMETHASONE SODIUM PHOSPHATE 10 MG/ML IJ SOLN
10.0000 mg | Freq: Once | INTRAMUSCULAR | Status: AC
  Administered 2016-11-14: 10 mg via INTRAVENOUS
  Filled 2016-11-14: qty 1

## 2016-11-14 MED ORDER — ONDANSETRON HCL 4 MG PO TABS: 4.0000 mg | ORAL_TABLET | Freq: Four times a day (QID) | ORAL | 0 refills | Status: DC | PRN

## 2016-11-14 MED ORDER — PANTOPRAZOLE SODIUM 40 MG PO TBEC: 40.0000 mg | DELAYED_RELEASE_TABLET | Freq: Every day | ORAL | 0 refills | Status: DC

## 2016-11-14 MED ORDER — POTASSIUM CHLORIDE CRYS ER 20 MEQ PO TBCR: 20.0000 meq | EXTENDED_RELEASE_TABLET | Freq: Two times a day (BID) | ORAL | 0 refills | Status: DC

## 2016-11-14 NOTE — ED Provider Notes (Signed)
Patient signed out to me to evaluate results of CT scan of abdomen and pelvis. CT shows no acute process. Patient presented with vomiting and diarrhea and abdominal pain for several days. Laboratory workup was significant for hypokalemia. Exam shows mild tenderness in the epigastric area and across the lower abdomen, but no rebound or guarding. She is discharged with prescriptions for ondansetron, pantoprazole, K Dur, and tramadol. Advised to use over-the-counter loperamide for diarrhea. Preterm precautions discussed.  Results for orders placed or performed during the hospital encounter of 11/13/16  Lipase, blood  Result Value Ref Range   Lipase 11 11 - 51 U/L  Comprehensive metabolic panel  Result Value Ref Range   Sodium 138 135 - 145 mmol/L   Potassium 3.2 (L) 3.5 - 5.1 mmol/L   Chloride 106 101 - 111 mmol/L   CO2 24 22 - 32 mmol/L   Glucose, Bld 117 (H) 65 - 99 mg/dL   BUN 12 6 - 20 mg/dL   Creatinine, Ser 4.09 0.44 - 1.00 mg/dL   Calcium 8.6 (L) 8.9 - 10.3 mg/dL   Total Protein 7.1 6.5 - 8.1 g/dL   Albumin 3.4 (L) 3.5 - 5.0 g/dL   AST 21 15 - 41 U/L   ALT 15 14 - 54 U/L   Alkaline Phosphatase 57 38 - 126 U/L   Total Bilirubin 0.4 0.3 - 1.2 mg/dL   GFR calc non Af Amer >60 >60 mL/min   GFR calc Af Amer >60 >60 mL/min   Anion gap 8 5 - 15  CBC  Result Value Ref Range   WBC 10.4 4.0 - 10.5 K/uL   RBC 4.42 3.87 - 5.11 MIL/uL   Hemoglobin 10.9 (L) 12.0 - 15.0 g/dL   HCT 81.1 (L) 91.4 - 78.2 %   MCV 79.4 78.0 - 100.0 fL   MCH 24.7 (L) 26.0 - 34.0 pg   MCHC 31.1 30.0 - 36.0 g/dL   RDW 95.6 21.3 - 08.6 %   Platelets 386 150 - 400 K/uL  Urinalysis, Routine w reflex microscopic  Result Value Ref Range   Color, Urine YELLOW YELLOW   APPearance HAZY (A) CLEAR   Specific Gravity, Urine 1.034 (H) 1.005 - 1.030   pH 5.0 5.0 - 8.0   Glucose, UA NEGATIVE NEGATIVE mg/dL   Hgb urine dipstick NEGATIVE NEGATIVE   Bilirubin Urine NEGATIVE NEGATIVE   Ketones, ur 5 (A) NEGATIVE mg/dL   Protein, ur 30 (A) NEGATIVE mg/dL   Nitrite NEGATIVE NEGATIVE   Leukocytes, UA NEGATIVE NEGATIVE   RBC / HPF 0-5 0 - 5 RBC/hpf   WBC, UA 0-5 0 - 5 WBC/hpf   Bacteria, UA RARE (A) NONE SEEN   Squamous Epithelial / LPF 0-5 (A) NONE SEEN   Mucous PRESENT   I-Stat beta hCG blood, ED  Result Value Ref Range   I-stat hCG, quantitative <5.0 <5 mIU/mL   Comment 3           Ct Abdomen Pelvis Wo Contrast  Result Date: 11/14/2016 CLINICAL DATA:  Abdominal pain for 2 days EXAM: CT ABDOMEN AND PELVIS WITHOUT CONTRAST TECHNIQUE: Multidetector CT imaging of the abdomen and pelvis was performed following the standard protocol without IV contrast. COMPARISON:  Radiographs 11/13/2016 FINDINGS: Lower chest: No acute abnormality. Hepatobiliary: No focal liver abnormality is seen. No gallstones, gallbladder wall thickening, or biliary dilatation. Pancreas: Unremarkable. No pancreatic ductal dilatation or surrounding inflammatory changes. Spleen: Normal in size without focal abnormality. Adrenals/Urinary Tract: Adrenal glands are unremarkable. Kidneys are  normal, without renal calculi, focal lesion, or hydronephrosis. Bladder is unremarkable. Stomach/Bowel: Stomach is within normal limits. Appendix is not optimally seen but there is no CT evidence of appendicitis. No evidence of bowel wall thickening, distention, or inflammatory changes. Vascular/Lymphatic: No significant vascular findings are present. No enlarged abdominal or pelvic lymph nodes. Reproductive: Uterus and bilateral adnexa are unremarkable. Other: No focal inflammation.  No ascites. Musculoskeletal: No significant skeletal lesion. IMPRESSION: Mild study limitations due to body habitus. No acute findings are evident. Electronically Signed   By: Ellery Plunk M.D.   On: 11/14/2016 02:07   Dg Abd Acute W/chest  Result Date: 11/13/2016 CLINICAL DATA:  Generalized abdominal pain. EXAM: DG ABDOMEN ACUTE W/ 1V CHEST COMPARISON:  10/26/2010 FINDINGS: Both  lungs are clear. Heart and mediastinum are within normal limits. Negative for free air. Nonobstructive bowel gas pattern. Few phleboliths in the pelvis. No acute bone abnormality. IMPRESSION: Negative abdominal radiographs.  No acute cardiopulmonary disease. Electronically Signed   By: Richarda Overlie M.D.   On: 11/13/2016 21:34    Images viewed by me.    Dione Booze, MD 11/14/16 [phone]

## 2016-11-14 NOTE — Discharge Instructions (Signed)
Take loperamide (Imodium AD) as needed for diarrhea. ° °Return if pain is getting worse. °

## 2016-11-17 ENCOUNTER — Telehealth: Payer: Self-pay | Admitting: *Deleted

## 2016-11-17 NOTE — Telephone Encounter (Signed)
PA for Rebif completed by phone with [employer], phone# [phone].  Approved for dates 11-17-16 thru 11-17-17.  Approval letter with PA# to follow.  Pt. has tried and failed Tysabri (allergy.)  Has been stable on Rebif since 2014./fim

## 2016-11-17 NOTE — Telephone Encounter (Signed)
Received fax notification from [employer] re approval for pa rebif non-formulary. Effective 09/11/16-11/17/17.

## 2016-11-22 DIAGNOSIS — Z0271 Encounter for disability determination: Secondary | ICD-10-CM

## 2016-11-28 ENCOUNTER — Telehealth: Payer: Self-pay

## 2016-11-28 NOTE — Telephone Encounter (Signed)
I spoke to patient and r/s her appt due to provider in a meeting.

## 2016-11-29 ENCOUNTER — Ambulatory Visit: Payer: Medicare Other | Admitting: Adult Health

## 2016-11-30 ENCOUNTER — Encounter (INDEPENDENT_AMBULATORY_CARE_PROVIDER_SITE_OTHER): Payer: Self-pay

## 2016-11-30 ENCOUNTER — Ambulatory Visit (INDEPENDENT_AMBULATORY_CARE_PROVIDER_SITE_OTHER): Payer: Medicare Other | Admitting: Adult Health

## 2016-11-30 ENCOUNTER — Encounter: Payer: Self-pay | Admitting: Adult Health

## 2016-11-30 VITALS — BP 110/68 | HR 81 | Wt 260.0 lb

## 2016-11-30 DIAGNOSIS — G35 Multiple sclerosis: Secondary | ICD-10-CM

## 2016-11-30 DIAGNOSIS — H538 Other visual disturbances: Secondary | ICD-10-CM

## 2016-11-30 NOTE — Patient Instructions (Addendum)
Continue Rebif Consider repeating MRI of the brain at the next visit.  If your symptoms worsen or you develop new symptoms please let us know.

## 2016-11-30 NOTE — Progress Notes (Signed)
I have read the note, and I agree with the clinical assessment and plan.  Zachariah Pavek KEITH   

## 2016-11-30 NOTE — Progress Notes (Signed)
PATIENT: Teresa Arnold DOB: 14-Jun-1976  REASON FOR VISIT: follow up- multiple sclerosis HISTORY FROM: patient  HISTORY OF PRESENT ILLNESS: Teresa Arnold is a 41 year old female with a history of multiple sclerosis. She returns today for follow-up. Overall she is doing well. She continues on Rebif and is tolerating it well. She states that she has ongoing problems with her vision specifically the left eye. She states that it is blurry. She did follow-up with her optometrist and will be receiving new glasses in the next 2 weeks. She denies any new symptoms. Denies any changes with her gait or balance. Denies any new numbness or weakness. No changes in her bowels or bladder. She had an MRI back in 2016 that was stable. The patient recently had blood work while in the hospital that was relatively unremarkable. She states that she was in the hospital due to acid reflux and gastritis. She returns today for an evaluation.   05/31/16 (WILLIS): Teresa Arnold is a 41 year old right-handed black female with a history of multiple sclerosis. The patient has been on Rebif, she has tolerated the medication well. She reports ongoing issues with her vision, particularly with the left eye. She has had a visual evoked response test done in June 2017 that showed good stability from a prior study done a year and a half prior. The patient has no response on the left, prolongation on the right side. The patient reports some difficulty with driving a car or with reading. Heat exposure will worsen the vision. The patient reports no other new symptoms of numbness or weakness on the face, arms, or legs. The patient denies any change in balance or difficulty controlling the bowels or the bladder. She has had blood work done on her last visit. She returns for an evaluation   REVIEW OF SYSTEMS: Out of a complete 14 system review of symptoms, the patient complains only of the following symptoms, and all other reviewed systems  are negative.  Light sensitivity, double vision, blurred vision, cough, shortness of breath, chest tightness, chest pain, restless leg, diarrhea, nausea, vomiting, abdominal pain, excessive eating, cold intolerance, back pain  ALLERGIES: Allergies  Allergen Reactions  . Other Shortness Of Breath and Itching    Tampons  . Gadolinium Derivatives Nausea And Vomiting    Pt only received half dose of 10ml before getting sick.   . Sulfa Antibiotics Itching and Rash  . Tysabri [Natalizumab] Rash    HOME MEDICATIONS: Outpatient Medications Prior to Visit  Medication Sig Dispense Refill  . acetaminophen (TYLENOL) 325 MG tablet Take 650 mg by mouth every 6 (six) hours as needed for moderate pain.     . Interferon Beta-1a (REBIF REBIDOSE) 44 MCG/0.5ML SOAJ Inject 44 mcg into the skin 3 (three) times a week. (Patient taking differently: Inject 44 mcg into the skin every Monday, Wednesday, and Friday. ) 12 Syringe 11  . ondansetron (ZOFRAN) 4 MG tablet Take 1 tablet (4 mg total) by mouth every 6 (six) hours as needed for nausea or vomiting. 12 tablet 0  . pantoprazole (PROTONIX) 40 MG tablet Take 1 tablet (40 mg total) by mouth daily. 15 tablet 0  . potassium chloride SA (K-DUR,KLOR-CON) 20 MEQ tablet Take 1 tablet (20 mEq total) by mouth 2 (two) times daily. 20 tablet 0  . predniSONE (DELTASONE) 50 MG tablet Take 50 mg by mouth daily.    . traMADol (ULTRAM) 50 MG tablet Take 1 tablet (50 mg total) by mouth every 6 (six) hours  as needed. 10 tablet 0   No facility-administered medications prior to visit.     PAST MEDICAL HISTORY: Past Medical History:  Diagnosis Date  . Bronchitis   . GERD (gastroesophageal reflux disease)   . MS (multiple sclerosis) (HCC)     PAST SURGICAL HISTORY: Past Surgical History:  Procedure Laterality Date  . CESAREAN SECTION     x4    FAMILY HISTORY: Family History  Problem Relation Age of Onset  . Hypertension Mother   . Hypertension Other     SOCIAL  HISTORY: Social History   Social History  . Marital status: Single    Spouse name: N/A  . Number of children: 4  . Years of education: 14   Occupational History  . Not on file.   Social History Main Topics  . Smoking status: Never Smoker  . Smokeless tobacco: Never Used  . Alcohol use 0.0 oz/week     Comment: occasionally  . Drug use: No  . Sexual activity: Yes    Birth control/ protection: None   Other Topics Concern  . Not on file   Social History Narrative   Patient is single and lives with her family.   Patient has four children.   Patient has a college education.   Patient is right-handed.   Patient does not drink any caffeine.      PHYSICAL EXAM  Vitals:   11/30/16 0953  BP: 110/68  Pulse: 81  Weight: 260 lb (117.9 kg)   Body mass index is 43.94 kg/m.  Generalized: Well developed, in no acute distress, obese   Neurological examination  Mentation: Alert oriented to time, place, history taking. Follows all commands speech and language fluent Cranial nerve II-XII: Pupils were equal round reactive to light. Extraocular movements were full, visual field were full on confrontational test. Facial sensation and strength were normal. Uvula tongue midline. Head turning and shoulder shrug  were normal and symmetric. Motor: The motor testing reveals 5 over 5 strength of all 4 extremities. Good symmetric motor tone is noted throughout.  Sensory: Sensory testing is intact to soft touch on all 4 extremities. No evidence of extinction is noted.  Coordination: Cerebellar testing reveals good finger-nose-finger and heel-to-shin bilaterally.  Gait and station: Gait is normal. Tandem gait not attempted Romberg is negative. No drift is seen.  Reflexes: Deep tendon reflexes are symmetric and normal bilaterally.   DIAGNOSTIC DATA (LABS, IMAGING, TESTING) - I reviewed patient records, labs, notes, testing and imaging myself where available.  Lab Results  Component Value Date    WBC 10.4 11/13/2016   HGB 10.9 (L) 11/13/2016   HCT 35.1 (L) 11/13/2016   MCV 79.4 11/13/2016   PLT 386 11/13/2016      Component Value Date/Time   NA 138 11/13/2016 1605   NA 138 12/30/2015 1049   K 3.2 (L) 11/13/2016 1605   CL 106 11/13/2016 1605   CO2 24 11/13/2016 1605   GLUCOSE 117 (H) 11/13/2016 1605   BUN 12 11/13/2016 1605   BUN 14 12/30/2015 1049   CREATININE 0.63 11/13/2016 1605   CALCIUM 8.6 (L) 11/13/2016 1605   PROT 7.1 11/13/2016 1605   PROT 7.4 12/30/2015 1049   ALBUMIN 3.4 (L) 11/13/2016 1605   ALBUMIN 3.9 12/30/2015 1049   AST 21 11/13/2016 1605   ALT 15 11/13/2016 1605   ALKPHOS 57 11/13/2016 1605   BILITOT 0.4 11/13/2016 1605   BILITOT 0.2 12/30/2015 1049   GFRNONAA >60 11/13/2016 1605   GFRAA >  60 11/13/2016 1605      ASSESSMENT AND PLAN 41 y.o. year old female  has a past medical history of Bronchitis; GERD (gastroesophageal reflux disease); and MS (multiple sclerosis) (HCC). here with:  1. Multiple sclerosis 2. Blurred vision on the left  Overall the patient has remained stable. She will continue on Rebif. She recently had blood work in the hospital. We will consider repeat MRI of the brain at the next visit. Advised that if her symptoms worsen or she develops new symptoms she should let us know. She will follow-up in 6 months or sooner if needed.    Butch Penny, MSN, NP-C 11/30/2016, 10:32 AM [facility] [address], [address] [place], [place] [address] [phone]

## 2016-12-01 ENCOUNTER — Ambulatory Visit (INDEPENDENT_AMBULATORY_CARE_PROVIDER_SITE_OTHER): Payer: Medicare Other | Admitting: Family Medicine

## 2016-12-01 ENCOUNTER — Encounter: Payer: Self-pay | Admitting: Family Medicine

## 2016-12-01 VITALS — HR 84 | Temp 97.8°F | Resp 16 | Ht 64.5 in | Wt 260.0 lb

## 2016-12-01 DIAGNOSIS — K219 Gastro-esophageal reflux disease without esophagitis: Secondary | ICD-10-CM

## 2016-12-01 DIAGNOSIS — G35 Multiple sclerosis: Secondary | ICD-10-CM

## 2016-12-01 DIAGNOSIS — G35D Multiple sclerosis, unspecified: Secondary | ICD-10-CM

## 2016-12-01 DIAGNOSIS — E876 Hypokalemia: Secondary | ICD-10-CM | POA: Diagnosis not present

## 2016-12-01 LAB — POTASSIUM: POTASSIUM: 3.8 mmol/L (ref 3.5–5.3)

## 2016-12-01 LAB — POCT URINALYSIS DIP (DEVICE)
Bilirubin Urine: NEGATIVE
GLUCOSE, UA: NEGATIVE mg/dL
Hgb urine dipstick: NEGATIVE
Ketones, ur: NEGATIVE mg/dL
LEUKOCYTES UA: NEGATIVE
NITRITE: NEGATIVE
Protein, ur: NEGATIVE mg/dL
Specific Gravity, Urine: 1.02 (ref 1.005–1.030)
UROBILINOGEN UA: 0.2 mg/dL (ref 0.0–1.0)
pH: 5 (ref 5.0–8.0)

## 2016-12-01 MED ORDER — PANTOPRAZOLE SODIUM 40 MG PO TBEC: 40.0000 mg | DELAYED_RELEASE_TABLET | Freq: Every day | ORAL | 11 refills | Status: DC

## 2016-12-01 NOTE — Progress Notes (Signed)
Patient ID: Teresa Arnold, female    DOB: 1975-10-19, 41 y.o.   MRN: [id number]  PCP: Joaquin Courts, FNP  Chief Complaint  Patient presents with  . Annual Exam    no pap  . Medication Refill    protonix    Subjective:  HPI Teresa Arnold is a 41 y.o. female presents to establish care and requesting a refill of acid reflux medication.  Past medical history includes Multiple Sclerosis, HSV, and GERD.  Multiple Sclerosis  She is followed by neurology for management if MS symptoms, last neurology visit 12/01/16 with Butch Penny, NP.  Andrya reports worsening visual disturbances of the left eye including blurring of vision and increase sensitivity to light. Patient reports that she is aware that visual disturbances are to be expected with MS however she has been evaluated by optometrist and received a new eye glass prescription that she will purchase later this month. She is to follow-up with neurology in 6 months for possible repeat MRI of the head.  GERD/Hypokalemia Dhiya was seen on 11/13/2016 at the [facility] ED for abdominal pain, nausea, vomiting, and diarrhea. Her labs during that visit indicated mild hypokalemia for which she received oral replacement and reports that she continues to take 20 MEQ of Kdur daily since discharged from the ED. Reports complete resolution of abdominal pain although has experienced some burning and discomfort associated with intake of certain foods and request a refill pf pantoprazole which she reports has helped significantly with symptoms.  Social History   Social History  . Marital status: Single    Spouse name: N/A  . Number of children: 4  . Years of education: 14   Occupational History  . Not on file.   Social History Main Topics  . Smoking status: Never Smoker  . Smokeless tobacco: Never Used  . Alcohol use 0.0 oz/week     Comment: occasionally  . Drug use: No  . Sexual activity: Yes    Birth control/  protection: None   Other Topics Concern  . Not on file   Social History Narrative   Patient is single and lives with her family.   Patient has four children.   Patient has a college education.   Patient is right-handed.   Patient does not drink any caffeine.    Family History  Problem Relation Age of Onset  . Hypertension Mother   . Hypertension Other    Review of Systems See HPI  Patient Active Problem List   Diagnosis Date Noted  . 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 Multiple sclerosis (HCC) 02/13/2013    Allergies  Allergen Reactions  . Other Shortness Of Breath and Itching    Tampons  . Gadolinium Derivatives Nausea And Vomiting    Pt only received half dose of 10ml before getting sick.   . Sulfa Antibiotics Itching and Rash  . Tysabri [Natalizumab] Rash    Prior to Admission medications   Medication Sig Start Date End Date Taking? Authorizing Provider  acetaminophen (TYLENOL) 325 MG tablet Take 650 mg by mouth every 6 (six) hours as needed for moderate pain.    Yes Historical Provider, MD  Interferon Beta-1a (REBIF REBIDOSE) 44 MCG/0.5ML SOAJ Inject 44 mcg into the skin 3 (three) times a week. Patient taking differently: Inject 44 mcg into the skin every Monday, Wednesday, and Friday.  03/08/16  Yes York Spaniel, MD  ondansetron (ZOFRAN) 4 MG tablet Take 1 tablet (4 mg total) by  mouth every 6 (six) hours as needed for nausea or vomiting. 11/14/16  Yes Dione Booze, MD  pantoprazole (PROTONIX) 40 MG tablet Take 1 tablet (40 mg total) by mouth daily. 11/14/16  Yes Dione Booze, MD  potassium chloride SA (K-DUR,KLOR-CON) 20 MEQ tablet Take 1 tablet (20 mEq total) by mouth 2 (two) times daily. 11/14/16  Yes Dione Booze, MD  traMADol (ULTRAM) 50 MG tablet Take 1 tablet (50 mg total) by mouth every 6 (six) hours as needed. 11/14/16  Yes Dione Booze, MD    Past Medical, Surgical Family and Social History reviewed and updated.    Objective:   Today's Vitals   12/01/16 0930   Pulse: 84  Resp: 16  Temp: 97.8 F (36.6 C)  TempSrc: Oral  SpO2: 100%  Weight: 260 lb (117.9 kg)  Height: 5' 4.5" (1.638 m)    Wt Readings from Last 3 Encounters:  12/01/16 260 lb (117.9 kg)  11/30/16 260 lb (117.9 kg)  05/31/16 252 lb 4 oz (114.4 kg)    Physical Exam  Constitutional: She is oriented to person, place, and time. She appears well-developed and well-nourished.  HENT:  Head: Normocephalic and atraumatic.  Right Ear: External ear normal.  Left Ear: External ear normal.  Nose: Nose normal.  Mouth/Throat: Oropharynx is clear and moist.  Eyes: Conjunctivae and EOM are normal. Pupils are equal, round, and reactive to light. Right eye exhibits no discharge. Left eye exhibits no discharge. No scleral icterus.  Noted sensitivity to light -bilateral eyes  Neck: Normal range of motion. Neck supple. No thyromegaly present.  Cardiovascular: Normal rate, regular rhythm, normal heart sounds and intact distal pulses.   No murmur heard. Pulmonary/Chest: Effort normal and breath sounds normal.  Abdominal: Soft. Bowel sounds are normal. She exhibits no distension and no mass. There is no tenderness. There is no rebound and no guarding.  Musculoskeletal: Normal range of motion.  Lymphadenopathy:    She has no cervical adenopathy.  Neurological: She is alert and oriented to person, place, and time.  Skin: Skin is warm and dry.  Psychiatric: She has a normal mood and affect. Her behavior is normal. Judgment and thought content normal.      Assessment & Plan:  1. Hypokalemia - Potassium, recheck serum level. If normal will have patient discontinue taking supplement.  2. Multiple sclerosis (HCC), stable -Continue treatment prescribed by neurologist and follow-up as scheduled.  3. Gastroesophageal reflux disease without esophagitis -Continue pantoprazole (Protonix) 40 mg daily.  Return for follow-up in 6 months.  Godfrey Pick. Tiburcio Pea, MSN, [facility] [address] [place], [place] [address] [phone]

## 2016-12-03 ENCOUNTER — Telehealth: Payer: Self-pay | Admitting: Family Medicine

## 2016-12-04 DIAGNOSIS — K219 Gastro-esophageal reflux disease without esophagitis: Secondary | ICD-10-CM | POA: Insufficient documentation

## 2016-12-04 NOTE — Telephone Encounter (Signed)
erronious

## 2017-02-07 ENCOUNTER — Telehealth: Payer: Self-pay | Admitting: *Deleted

## 2017-02-07 NOTE — Telephone Encounter (Signed)
-----   Message from Butch Penny, NP sent at 02/06/2017  9:36 AM EDT ----- VER is unchanged from previous study. Please call patient with results

## 2017-02-07 NOTE — Telephone Encounter (Signed)
Spoke to pt and relayed that the VER results are unchanged from her previous study.  She verbalized understanding.

## 2017-02-12 ENCOUNTER — Telehealth: Payer: Self-pay | Admitting: *Deleted

## 2017-02-12 MED ORDER — INTERFERON BETA-1A 44 MCG/0.5ML ~~LOC~~ SOAJ: 44.0000 ug | SUBCUTANEOUS | 1 refills | Status: DC

## 2017-02-12 NOTE — Telephone Encounter (Signed)
Review documentation from Rebif nurse. Refills ordered

## 2017-02-12 NOTE — Telephone Encounter (Signed)
Received nurse visit for rebif form.  To CM/NP for review.

## 2017-05-21 ENCOUNTER — Ambulatory Visit (INDEPENDENT_AMBULATORY_CARE_PROVIDER_SITE_OTHER): Payer: Medicare Other | Admitting: Family Medicine

## 2017-05-21 ENCOUNTER — Encounter: Payer: Self-pay | Admitting: Family Medicine

## 2017-05-21 VITALS — BP 140/86 | HR 70 | Temp 97.8°F | Resp 14 | Ht 64.0 in | Wt 272.0 lb

## 2017-05-21 DIAGNOSIS — Z8639 Personal history of other endocrine, nutritional and metabolic disease: Secondary | ICD-10-CM | POA: Diagnosis not present

## 2017-05-21 DIAGNOSIS — B009 Herpesviral infection, unspecified: Secondary | ICD-10-CM | POA: Diagnosis not present

## 2017-05-21 DIAGNOSIS — G44229 Chronic tension-type headache, not intractable: Secondary | ICD-10-CM | POA: Diagnosis not present

## 2017-05-21 DIAGNOSIS — G35 Multiple sclerosis: Secondary | ICD-10-CM | POA: Diagnosis not present

## 2017-05-21 LAB — POCT URINALYSIS DIP (DEVICE)
Bilirubin Urine: NEGATIVE
Glucose, UA: NEGATIVE mg/dL
KETONES UR: NEGATIVE mg/dL
LEUKOCYTES UA: NEGATIVE
NITRITE: NEGATIVE
Protein, ur: NEGATIVE mg/dL
SPECIFIC GRAVITY, URINE: 1.025 (ref 1.005–1.030)
Urobilinogen, UA: 1 mg/dL (ref 0.0–1.0)
pH: 5.5 (ref 5.0–8.0)

## 2017-05-21 MED ORDER — BUTALBITAL-APAP-CAFF-COD 50-325-40-30 MG PO CAPS: 1.0000 | ORAL_CAPSULE | ORAL | 0 refills | Status: DC | PRN

## 2017-05-21 MED ORDER — VALACYCLOVIR HCL 1 G PO TABS: 1000.0000 mg | ORAL_TABLET | Freq: Every day | ORAL | 2 refills | Status: DC

## 2017-05-21 NOTE — Progress Notes (Signed)
Patient ID: Teresa Arnold, female    DOB: 11/20/1975, 41 y.o.   MRN: [id number]  PCP: Bing Neighbors, FNP  Chief Complaint  Patient presents with  . Follow-up    6 MONTH    Subjective:  HPI Teresa Arnold is a 41 y.o. female with multiple sclerosis, presents for routine 6 month follow-up. She is deferring her PAP today to next follow-up. Teresa Arnold is followed by Dr. Stephanie Acre at [facility] for management of MS. Next follow-up tomorrow 05/22/2017. Today, Denyce complains of on-going bilateral headaches with associated blurring of vision. Visual blurring has been on-going associated with MS diagnosis. For headaches, she has attempted to managed with OTC acetaminophen and ibuprofen with minimal relief. Headaches occur almost weekly. Uncertain of any associated factors that precipitate the headaches. She reports an active HSV outbreak and requests a refill of Valacyclovir. Social History   Social History  . Marital status: Single    Spouse name: N/A  . Number of children: 4  . Years of education: 14   Occupational History  . Not on file.   Social History Main Topics  . Smoking status: Never Smoker  . Smokeless tobacco: Never Used  . Alcohol use 0.0 oz/week     Comment: occasionally  . Drug use: No  . Sexual activity: Yes    Birth control/ protection: None   Other Topics Concern  . Not on file   Social History Narrative   Patient is single and lives with her family.   Patient has four children.   Patient has a college education.   Patient is right-handed.   Patient does not drink any caffeine.    Family History  Problem Relation Age of Onset  . Hypertension Mother   . Hypertension Other    Review of Systems See HPI  Patient Active Problem List   Diagnosis Date Noted  . Acid reflux 12/04/2016  . 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 Multiple sclerosis (HCC) 02/13/2013    Allergies  Allergen Reactions  . Other Shortness Of Breath and Itching     Tampons  . Gadolinium Derivatives Nausea And Vomiting    Pt only received half dose of 10ml before getting sick.   . Sulfa Antibiotics Itching and Rash  . Tysabri [Natalizumab] Rash    Prior to Admission medications   Medication Sig Start Date End Date Taking? Authorizing Provider  acetaminophen (TYLENOL) 325 MG tablet Take 650 mg by mouth every 6 (six) hours as needed for moderate pain.    Yes [provider]  Interferon Beta-1a (REBIF REBIDOSE) 44 MCG/0.5ML SOAJ Inject 44 mcg into the skin every Monday, Wednesday, and Friday. 02/12/17  Yes Nilda Riggs, NP  pantoprazole (PROTONIX) 40 MG tablet Take 1 tablet (40 mg total) by mouth daily. 12/01/16  Yes Bing Neighbors, FNP  traMADol (ULTRAM) 50 MG tablet Take 1 tablet (50 mg total) by mouth every 6 (six) hours as needed. 11/14/16  Yes Dione Booze, MD  valACYclovir (VALTREX) 1000 MG tablet Take 1,000 mg by mouth daily.   Yes [provider]  ondansetron (ZOFRAN) 4 MG tablet Take 1 tablet (4 mg total) by mouth every 6 (six) hours as needed for nausea or vomiting. Patient not taking: Reported on 05/21/2017 11/14/16   Dione Booze, MD  potassium chloride SA (K-DUR,KLOR-CON) 20 MEQ tablet Take 1 tablet (20 mEq total) by mouth 2 (two) times daily. Patient not taking: Reported on 05/21/2017 11/14/16   Dione Booze, MD  Past Medical, Surgical Family and Social History reviewed and updated.    Objective:   Vitals:   05/21/17 1107  BP: 140/86  Pulse: 70  Resp: 14  Temp: 97.8 F (36.6 C)  SpO2: 100%    Today's Vitals   05/21/17 1107  Weight: 272 lb (123.4 kg)  Height:  (1.626 m)    Wt Readings from Last 3 Encounters:  05/21/17 272 lb (123.4 kg)  12/01/16 260 lb (117.9 kg)  11/30/16 260 lb (117.9 kg)    Physical Exam  Constitutional: She is oriented to person, place, and time. She appears well-developed and well-nourished.  HENT:  Head: Normocephalic and atraumatic.  Eyes: Pupils are equal, round,  and reactive to light. Conjunctivae and EOM are normal. Right eye exhibits no discharge. Left eye exhibits no discharge.  Neck: Normal range of motion. Neck supple. No thyromegaly present.  Cardiovascular: Normal rate, regular rhythm, normal heart sounds and intact distal pulses.   Pulmonary/Chest: Effort normal and breath sounds normal.  Abdominal: Soft. Bowel sounds are normal.  Musculoskeletal: Normal range of motion.  Neurological: She is alert and oriented to person, place, and time. Coordination normal.  5/5 bilateral hand grips  Normal upper and lower extremity strength 5/5   Skin: Skin is warm and dry.  Psychiatric: She has a normal mood and affect. Her behavior is normal. Judgment and thought content normal.   Assessment & Plan:  1. Chronic tension-type headache, not intractable -Will trial Fioricet for abortive therapy of headaches.  2. MS (multiple sclerosis) (HCC), managed by neurology. Currently Rebif injections, 3 times weekly  3. HSV infection, chronic anti-viral  4. History of low potassium, recheck CMET  5. History of elevated glucose, rule out T2DM, check A1C.   RTC: 6 months for PAP and wellness visit   Godfrey Pick. Tiburcio Pea, MSN, FNP-C [facility]  [address] [place], [place] [address] [phone]

## 2017-05-21 NOTE — Patient Instructions (Signed)

## 2017-05-22 ENCOUNTER — Encounter: Payer: Self-pay | Admitting: Neurology

## 2017-05-22 ENCOUNTER — Encounter: Payer: Self-pay | Admitting: Family Medicine

## 2017-05-22 ENCOUNTER — Ambulatory Visit (INDEPENDENT_AMBULATORY_CARE_PROVIDER_SITE_OTHER): Payer: Medicare Other | Admitting: Neurology

## 2017-05-22 VITALS — BP 145/98 | HR 82 | Ht 64.0 in | Wt 271.0 lb

## 2017-05-22 DIAGNOSIS — E6609 Other obesity due to excess calories: Secondary | ICD-10-CM | POA: Insufficient documentation

## 2017-05-22 DIAGNOSIS — D500 Iron deficiency anemia secondary to blood loss (chronic): Secondary | ICD-10-CM

## 2017-05-22 DIAGNOSIS — D509 Iron deficiency anemia, unspecified: Secondary | ICD-10-CM

## 2017-05-22 DIAGNOSIS — F5104 Psychophysiologic insomnia: Secondary | ICD-10-CM

## 2017-05-22 DIAGNOSIS — G35 Multiple sclerosis: Secondary | ICD-10-CM

## 2017-05-22 HISTORY — DX: Psychophysiologic insomnia: F51.04

## 2017-05-22 HISTORY — DX: Iron deficiency anemia, unspecified: D50.9

## 2017-05-22 HISTORY — DX: Morbid (severe) obesity due to excess calories: E66.01

## 2017-05-22 LAB — CBC WITH DIFFERENTIAL/PLATELET
BASOS PCT: 0.4 %
Basophils Absolute: 23 cells/uL (ref 0–200)
EOS PCT: 1.6 %
Eosinophils Absolute: 91 cells/uL (ref 15–500)
HEMATOCRIT: 32.6 % — AB (ref 35.0–45.0)
Hemoglobin: 10.3 g/dL — ABNORMAL LOW (ref 11.7–15.5)
LYMPHS ABS: 2035 {cells}/uL (ref 850–3900)
MCH: 24.3 pg — ABNORMAL LOW (ref 27.0–33.0)
MCHC: 31.6 g/dL — ABNORMAL LOW (ref 32.0–36.0)
MCV: 77.1 fL — ABNORMAL LOW (ref 80.0–100.0)
MPV: 9.7 fL (ref 7.5–12.5)
Monocytes Relative: 9.8 %
Neutro Abs: 2993 cells/uL (ref 1500–7800)
Neutrophils Relative %: 52.5 %
Platelets: 323 10*3/uL (ref 140–400)
RBC: 4.23 10*6/uL (ref 3.80–5.10)
RDW: 13.9 % (ref 11.0–15.0)
Total Lymphocyte: 35.7 %
WBC mixed population: 559 cells/uL (ref 200–950)
WBC: 5.7 10*3/uL (ref 3.8–10.8)

## 2017-05-22 LAB — HEMOGLOBIN A1C
HEMOGLOBIN A1C: 5.3 %{Hb} (ref <5.7)
MEAN PLASMA GLUCOSE: 105 (calc)
eAG (mmol/L): 5.8 (calc)

## 2017-05-22 LAB — COMPLETE METABOLIC PANEL WITH GFR
AG Ratio: 0.9 (calc) — ABNORMAL LOW (ref 1.0–2.5)
ALKALINE PHOSPHATASE (APISO): 84 U/L (ref 33–115)
ALT: 16 U/L (ref 6–29)
AST: 17 U/L (ref 10–30)
Albumin: 3.5 g/dL — ABNORMAL LOW (ref 3.6–5.1)
BILIRUBIN TOTAL: 0.2 mg/dL (ref 0.2–1.2)
BUN: 16 mg/dL (ref 7–25)
CHLORIDE: 104 mmol/L (ref 98–110)
CO2: 27 mmol/L (ref 20–32)
Calcium: 8.5 mg/dL — ABNORMAL LOW (ref 8.6–10.2)
Creat: 0.67 mg/dL (ref 0.50–1.10)
GFR, EST AFRICAN AMERICAN: 128 mL/min/{1.73_m2} (ref >=60)
GFR, Est Non African American: 111 mL/min/{1.73_m2} (ref >=60)
GLUCOSE: 86 mg/dL (ref 65–99)
Globulin: 3.8 g/dL (calc) — ABNORMAL HIGH (ref 1.9–3.7)
Potassium: 4.1 mmol/L (ref 3.5–5.3)
Sodium: 138 mmol/L (ref 135–146)
TOTAL PROTEIN: 7.3 g/dL (ref 6.1–8.1)

## 2017-05-22 MED ORDER — TRAZODONE HCL 50 MG PO TABS: 50.0000 mg | ORAL_TABLET | Freq: Every day | ORAL | 3 refills | Status: DC

## 2017-05-22 MED ORDER — FERROUS GLUCONATE 324 (38 FE) MG PO TABS: 324.0000 mg | ORAL_TABLET | Freq: Every day | ORAL | 1 refills | Status: DC

## 2017-05-22 NOTE — Patient Instructions (Signed)
   We will start iron therapy and start Trazodone 50 mg tablet at night for sleep, call for any dose adjustments.  We will get MRI of the brain

## 2017-05-22 NOTE — Progress Notes (Signed)
Reason for visit: Multiple sclerosis  Teresa Arnold is an 41 y.o. female  History of present illness:  Teresa Arnold is a 41 year old right-handed black female with a history of multiple sclerosis and significant obesity. The patient has recently had blood work done through her primary care physician that shows evidence of a mild iron deficiency anemia. The patient does complain of some underlying fatigue issues. She is heat sensitive to some degree. She denies any new neurologic symptoms since last seen in March 2018. She has chronic blurring of vision of the left eye, she got new glasses which seemed to help some. She last had MRI evaluation of the brain 2 years ago. The patient does have some slight balance problems, she denies any recent falls. The patient denies issues controlling the bowels or bladder but she does have some chronic constipation problems. The patient has chronic insomnia, she has fatigue during the day in part associated with this. She sleeps only 4 or 5 hours at night. The patient does not nap during the day. She returns to this office for an evaluation. She is on Rebif, she tolerates the medication well.  Past Medical History:  Diagnosis Date  . Bronchitis   . Chronic insomnia 05/22/2017  . GERD (gastroesophageal reflux disease)   . Iron deficiency anemia 05/22/2017  . Morbid obesity (HCC) 05/22/2017  . MS (multiple sclerosis) (HCC)     Past Surgical History:  Procedure Laterality Date  . CESAREAN SECTION     x4    Family History  Problem Relation Age of Onset  . Hypertension Mother   . Hypertension Other     Social history:  reports that she has never smoked. She has never used smokeless tobacco. She reports that she drinks alcohol. She reports that she does not use drugs.    Allergies  Allergen Reactions  . Other Shortness Of Breath and Itching    Tampons  . Gadolinium Derivatives Nausea And Vomiting    Pt only received half dose of 10ml before  getting sick.   . Sulfa Antibiotics Itching and Rash  . Tysabri [Natalizumab] Rash    Medications:  Prior to Admission medications   Medication Sig Start Date End Date Taking? Authorizing Provider  acetaminophen (TYLENOL) 325 MG tablet Take 650 mg by mouth every 6 (six) hours as needed for moderate pain.    Yes [provider]  butalbital-acetaminophen-caffeine (FIORICET/CODEINE) 50-325-40-30 MG capsule Take 1 capsule by mouth every 4 (four) hours as needed for headache. 05/21/17  Yes Bing Neighbors, FNP  Interferon Beta-1a (REBIF REBIDOSE) 44 MCG/0.5ML SOAJ Inject 44 mcg into the skin every Monday, Wednesday, and Friday. 02/12/17  Yes Nilda Riggs, NP  ondansetron (ZOFRAN) 4 MG tablet Take 1 tablet (4 mg total) by mouth every 6 (six) hours as needed for nausea or vomiting. 11/14/16  Yes Dione Booze, MD  pantoprazole (PROTONIX) 40 MG tablet Take 1 tablet (40 mg total) by mouth daily. 12/01/16  Yes Bing Neighbors, FNP  potassium chloride SA (K-DUR,KLOR-CON) 20 MEQ tablet Take 1 tablet (20 mEq total) by mouth 2 (two) times daily. 11/14/16  Yes Dione Booze, MD  traMADol (ULTRAM) 50 MG tablet Take 1 tablet (50 mg total) by mouth every 6 (six) hours as needed. 11/14/16  Yes Dione Booze, MD  valACYclovir (VALTREX) 1000 MG tablet Take 1 tablet (1,000 mg total) by mouth daily. 05/21/17  Yes Bing Neighbors, FNP    ROS:  Out of a complete  14 system review of symptoms, the patient complains only of the following symptoms, and all other reviewed systems are negative.  Chills, fatigue Headache  Blood pressure (!) 145/98, pulse 82, height 5\' 4"  (1.626 m), weight 271 lb (122.9 kg), last menstrual period 05/19/2017, SpO2 95 %.  Physical Exam  General: The patient is alert and cooperative at the time of the examination. The patient is markedly obese.  Skin: No significant peripheral edema is noted.   Neurologic Exam  Mental status: The patient is alert and oriented x 3 at the  time of the examination. The patient has apparent normal recent and remote memory, with an apparently normal attention span and concentration ability.   Cranial nerves: Facial symmetry is present. Speech is normal, no aphasia or dysarthria is noted. Extraocular movements are full. Visual fields are full. Pupils are equal, round, and reactive to light. Discs are flat bilaterally.  Motor: The patient has good strength in all 4 extremities.  Sensory examination: Soft touch sensation is symmetric on the face, arms, and legs.  Coordination: The patient has good finger-nose-finger and heel-to-shin bilaterally.  Gait and station: The patient has a normal gait. Tandem gait is unsteady. Romberg is negative, but is unsteady. No drift is seen.  Reflexes: Deep tendon reflexes are symmetric.   Assessment/Plan:  1. Multiple sclerosis  2. Visual disturbance, left eye blurring  3. Chronic insomnia  4. Iron deficiency anemia  5. Obesity  The patient will be placed on ferrous gluconate for her iron deficiency anemia. The patient will be placed on trazodone 50 mg at night for sleep, she will call for dose adjustments. She will have MRI of the brain without contrast, she is allergic to gadolinium. She will continue the Rebif, and she will follow-up in 6 months.  Marlan Palau MD 05/22/2017 10:46 AM  [facility] [address] [place], [place] [address]  Phone [phone] Fax [phone]

## 2017-05-22 NOTE — Progress Notes (Signed)
Please mail results to patient

## 2017-06-05 ENCOUNTER — Ambulatory Visit
Admission: RE | Admit: 2017-06-05 | Discharge: 2017-06-05 | Disposition: A | Discharge status: Home / Self Care | Payer: Medicare Other | Source: Ambulatory Visit | Attending: Neurology | Admitting: Neurology

## 2017-06-05 DIAGNOSIS — G35 Multiple sclerosis: Secondary | ICD-10-CM | POA: Diagnosis not present

## 2017-06-06 ENCOUNTER — Telehealth: Payer: Self-pay | Admitting: Neurology

## 2017-06-06 NOTE — Telephone Encounter (Signed)
I called the patient. MRI is stable from 2016, stay on current therapy.   MRI brain 06/06/17:  IMPRESSION: Abnormal MRI scan of the brain without contrast showing multiple periventricular, subcortical, brainstem and left cerebellar white matter hyperintensities consistent with chronic demyelinating disease. The presence of T 1 black holes indicates chronic disease. No significant change compared with previous MRI scan dated 06/15/2015.

## 2017-07-23 ENCOUNTER — Other Ambulatory Visit: Payer: Self-pay | Admitting: *Deleted

## 2017-07-23 MED ORDER — TRAZODONE HCL 50 MG PO TABS: 50.0000 mg | ORAL_TABLET | Freq: Every day | ORAL | 0 refills | Status: DC

## 2017-08-04 ENCOUNTER — Other Ambulatory Visit: Payer: Self-pay | Admitting: Nurse Practitioner

## 2017-08-13 ENCOUNTER — Other Ambulatory Visit: Payer: Self-pay

## 2017-08-13 MED ORDER — VALACYCLOVIR HCL 1 G PO TABS: 1000.0000 mg | ORAL_TABLET | Freq: Every day | ORAL | 2 refills | Status: DC

## 2017-08-23 ENCOUNTER — Telehealth: Payer: Self-pay | Admitting: Neurology

## 2017-08-23 NOTE — Telephone Encounter (Signed)
Pt called regarding REBIF REBIDOSE 44 MCG/0.5ML SOAJ. Pt was not willing to give any information starting with her date of birth and name, she asked "what did that have to do with talking with a nurse" .I explained to her I needed to verify she was a pt of the clinic and what provider she sees. Any question I asked her she was not willing to give information or she would say "well you have that information don't you". Please call to advise

## 2017-08-23 NOTE — Telephone Encounter (Signed)
Called and spoke with pt. She just received rx Rebif in mail. She is wondering if she should start medication now or wait until Monday to start taking Monday, Wednesday, Friday. She already missed Monday and Wednesday dose this week.  [facility] nurse told her to start taking right away. She is wanting to know how Dr. Anne Hahn would like her to take medication. Advised he is with a patient. I will check with him and call her back to advise. She verbalized understanding.

## 2017-08-23 NOTE — Telephone Encounter (Signed)
I called the patient.  She will get back started on her Requip tomorrow.  No need to wait till Monday for the injection.

## 2017-10-13 ENCOUNTER — Other Ambulatory Visit: Payer: Self-pay | Admitting: Family Medicine

## 2017-10-28 ENCOUNTER — Emergency Department (HOSPITAL_COMMUNITY)
Admission: EM | Admit: 2017-10-28 | Discharge: 2017-10-28 | Disposition: A | Discharge status: Home / Self Care | Payer: Medicare Other | Attending: Emergency Medicine | Admitting: Emergency Medicine

## 2017-10-28 ENCOUNTER — Other Ambulatory Visit: Payer: Self-pay

## 2017-10-28 ENCOUNTER — Encounter (HOSPITAL_COMMUNITY): Payer: Self-pay | Admitting: Emergency Medicine

## 2017-10-28 ENCOUNTER — Emergency Department (HOSPITAL_COMMUNITY): Payer: Medicare Other

## 2017-10-28 DIAGNOSIS — Z79899 Other long term (current) drug therapy: Secondary | ICD-10-CM | POA: Insufficient documentation

## 2017-10-28 DIAGNOSIS — G35 Multiple sclerosis: Secondary | ICD-10-CM | POA: Diagnosis not present

## 2017-10-28 DIAGNOSIS — Y999 Unspecified external cause status: Secondary | ICD-10-CM | POA: Diagnosis not present

## 2017-10-28 DIAGNOSIS — Y92009 Unspecified place in unspecified non-institutional (private) residence as the place of occurrence of the external cause: Secondary | ICD-10-CM | POA: Diagnosis not present

## 2017-10-28 DIAGNOSIS — S0181XA Laceration without foreign body of other part of head, initial encounter: Secondary | ICD-10-CM | POA: Diagnosis not present

## 2017-10-28 DIAGNOSIS — S0990XA Unspecified injury of head, initial encounter: Secondary | ICD-10-CM | POA: Diagnosis not present

## 2017-10-28 DIAGNOSIS — Y939 Activity, unspecified: Secondary | ICD-10-CM | POA: Insufficient documentation

## 2017-10-28 DIAGNOSIS — S01412A Laceration without foreign body of left cheek and temporomandibular area, initial encounter: Secondary | ICD-10-CM | POA: Diagnosis not present

## 2017-10-28 DIAGNOSIS — S022XXA Fracture of nasal bones, initial encounter for closed fracture: Secondary | ICD-10-CM | POA: Diagnosis not present

## 2017-10-28 DIAGNOSIS — G4489 Other headache syndrome: Secondary | ICD-10-CM | POA: Diagnosis not present

## 2017-10-28 DIAGNOSIS — S0993XA Unspecified injury of face, initial encounter: Secondary | ICD-10-CM | POA: Diagnosis present

## 2017-10-28 DIAGNOSIS — R220 Localized swelling, mass and lump, head: Secondary | ICD-10-CM | POA: Diagnosis not present

## 2017-10-28 DIAGNOSIS — S0083XA Contusion of other part of head, initial encounter: Secondary | ICD-10-CM

## 2017-10-28 LAB — POC URINE PREG, ED: Preg Test, Ur: NEGATIVE

## 2017-10-28 MED ORDER — OXYCODONE-ACETAMINOPHEN 5-325 MG PO TABS: 1.0000 | ORAL_TABLET | ORAL | 0 refills | Status: DC | PRN

## 2017-10-28 MED ORDER — ACETAMINOPHEN 325 MG PO TABS
650.0000 mg | ORAL_TABLET | Freq: Once | ORAL | Status: AC
Start: 2017-10-28 — End: 2017-10-28
  Administered 2017-10-28: 650 mg via ORAL
  Filled 2017-10-28: qty 2

## 2017-10-28 MED ORDER — ONDANSETRON 8 MG PO TBDP
8.0000 mg | ORAL_TABLET | Freq: Once | ORAL | Status: DC
  Filled 2017-10-28: qty 1

## 2017-10-28 NOTE — Discharge Instructions (Signed)
Apply ice several times a day.  Take acetaminophen, ibuprofen, or naproxen as needed for less severe pain.  Dermabond will fall off in about one week.

## 2017-10-28 NOTE — ED Notes (Signed)
Bed: [facility] Expected date:  Expected time:  Means of arrival:  Comments: 
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
## 2017-11-05 ENCOUNTER — Other Ambulatory Visit: Payer: Self-pay | Admitting: Neurology

## 2017-11-19 ENCOUNTER — Ambulatory Visit (INDEPENDENT_AMBULATORY_CARE_PROVIDER_SITE_OTHER): Payer: Medicare Other | Admitting: Family Medicine

## 2017-11-19 ENCOUNTER — Encounter: Payer: Self-pay | Admitting: Family Medicine

## 2017-11-19 VITALS — BP 136/88 | HR 74 | Temp 97.9°F | Resp 16 | Ht 64.0 in | Wt 268.0 lb

## 2017-11-19 DIAGNOSIS — G35 Multiple sclerosis: Secondary | ICD-10-CM | POA: Diagnosis not present

## 2017-11-19 DIAGNOSIS — Z0000 Encounter for general adult medical examination without abnormal findings: Secondary | ICD-10-CM

## 2017-11-19 DIAGNOSIS — R51 Headache: Secondary | ICD-10-CM

## 2017-11-19 DIAGNOSIS — Z124 Encounter for screening for malignant neoplasm of cervix: Secondary | ICD-10-CM | POA: Diagnosis not present

## 2017-11-19 DIAGNOSIS — Z1329 Encounter for screening for other suspected endocrine disorder: Secondary | ICD-10-CM

## 2017-11-19 DIAGNOSIS — Z01419 Encounter for gynecological examination (general) (routine) without abnormal findings: Secondary | ICD-10-CM | POA: Diagnosis not present

## 2017-11-19 DIAGNOSIS — R519 Headache, unspecified: Secondary | ICD-10-CM

## 2017-11-19 DIAGNOSIS — Z1231 Encounter for screening mammogram for malignant neoplasm of breast: Secondary | ICD-10-CM | POA: Diagnosis not present

## 2017-11-19 DIAGNOSIS — Z1239 Encounter for other screening for malignant neoplasm of breast: Secondary | ICD-10-CM

## 2017-11-19 NOTE — Patient Instructions (Signed)
Around September, contact breast center to schedule your mammogram, Phone: [phone]   You will be notified by mail of your results.    Health Maintenance, Female Adopting a healthy lifestyle and getting preventive care can go a long way to promote health and wellness. Talk with your health care provider about what schedule of regular examinations is right for you. This is a good chance for you to check in with your provider about disease prevention and staying healthy. In between checkups, there are plenty of things you can do on your own. Experts have done a lot of research about which lifestyle changes and preventive measures are most likely to keep you healthy. Ask your health care provider for more information. Weight and diet Eat a healthy diet  Be sure to include plenty of vegetables, fruits, low-fat dairy products, and lean protein.  Do not eat a lot of foods high in solid fats, added sugars, or salt.  Get regular exercise. This is one of the most important things you can do for your health. ? Most adults should exercise for at least 150 minutes each week. The exercise should increase your heart rate and make you sweat (moderate-intensity exercise). ? Most adults should also do strengthening exercises at least twice a week. This is in addition to the moderate-intensity exercise.  Maintain a healthy weight  Body mass index (BMI) is a measurement that can be used to identify possible weight problems. It estimates body fat based on height and weight. Your health care provider can help determine your BMI and help you achieve or maintain a healthy weight.  For females 32 years of age and older: ? A BMI below 18.5 is considered underweight. ? A BMI of 18.5 to 24.9 is normal. ? A BMI of 25 to 29.9 is considered overweight. ? A BMI of 30 and above is considered obese.  Watch levels of cholesterol and blood lipids  You should start having your blood tested for lipids and  cholesterol at 40 years of age, then have this test every 5 years.  You may need to have your cholesterol levels checked more often if: ? Your lipid or cholesterol levels are high. ? You are older than 40 years of age. ? You are at high risk for heart disease.  Cancer screening Lung Cancer  Lung cancer screening is recommended for adults 13-63 years old who are at high risk for lung cancer because of a history of smoking.  A yearly low-dose CT scan of the lungs is recommended for people who: ? Currently smoke. ? Have quit within the past 15 years. ? Have at least a 30-pack-year history of smoking. A pack year is smoking an average of one pack of cigarettes a day for 1 year.  Yearly screening should continue until it has been 15 years since you quit.  Yearly screening should stop if you develop a health problem that would prevent you from having lung cancer treatment.  Breast Cancer  Practice breast self-awareness. This means understanding how your breasts normally appear and feel.  It also means doing regular breast self-exams. Let your health care provider know about any changes, no matter how small.  If you are in your 20s or 30s, you should have a clinical breast exam (CBE) by a health care provider every 1-3 years as part of a regular health exam.  If you are 28 or older, have a CBE every year. Also consider having a breast X-ray (mammogram) every year.  If you have a family history of breast cancer, talk to your health care provider about genetic screening.  If you are at high risk for breast cancer, talk to your health care provider about having an MRI and a mammogram every year.  Breast cancer gene (BRCA) assessment is recommended for women who have family members with BRCA-related cancers. BRCA-related cancers include: ? Breast. ? Ovarian. ? Tubal. ? Peritoneal cancers.  Results of the assessment will determine the need for genetic counseling and BRCA1 and BRCA2  testing.  Cervical Cancer Your health care provider may recommend that you be screened regularly for cancer of the pelvic organs (ovaries, uterus, and vagina). This screening involves a pelvic examination, including checking for microscopic changes to the surface of your cervix (Pap test). You may be encouraged to have this screening done every 3 years, beginning at age 21.  For women ages 30-65, health care providers may recommend pelvic exams and Pap testing every 3 years, or they may recommend the Pap and pelvic exam, combined with testing for human papilloma virus (HPV), every 5 years. Some types of HPV increase your risk of cervical cancer. Testing for HPV may also be done on women of any age with unclear Pap test results.  Other health care providers may not recommend any screening for nonpregnant women who are considered low risk for pelvic cancer and who do not have symptoms. Ask your health care provider if a screening pelvic exam is right for you.  If you have had past treatment for cervical cancer or a condition that could lead to cancer, you need Pap tests and screening for cancer for at least 20 years after your treatment. If Pap tests have been discontinued, your risk factors (such as having a new sexual partner) need to be reassessed to determine if screening should resume. Some women have medical problems that increase the chance of getting cervical cancer. In these cases, your health care provider may recommend more frequent screening and Pap tests.  Colorectal Cancer  This type of cancer can be detected and often prevented.  Routine colorectal cancer screening usually begins at 40 years of age and continues through 40 years of age.  Your health care provider may recommend screening at an earlier age if you have risk factors for colon cancer.  Your health care provider may also recommend using home test kits to check for hidden blood in the stool.  A small camera at the end of a  tube can be used to examine your colon directly (sigmoidoscopy or colonoscopy). This is done to check for the earliest forms of colorectal cancer.  Routine screening usually begins at age 50.  Direct examination of the colon should be repeated every 5-10 years through 40 years of age. However, you may need to be screened more often if early forms of precancerous polyps or small growths are found.  Skin Cancer  Check your skin from head to toe regularly.  Tell your health care provider about any new moles or changes in moles, especially if there is a change in a mole's shape or color.  Also tell your health care provider if you have a mole that is larger than the size of a pencil eraser.  Always use sunscreen. Apply sunscreen liberally and repeatedly throughout the day.  Protect yourself by wearing long sleeves, pants, a wide-brimmed hat, and sunglasses whenever you are outside.  Heart disease, diabetes, and high blood pressure  High blood pressure causes heart disease and   increases the risk of stroke. High blood pressure is more likely to develop in: ? People who have blood pressure in the high end of the normal range (130-139/85-89 mm Hg). ? People who are overweight or obese. ? People who are African American.  If you are 18-39 years of age, have your blood pressure checked every 3-5 years. If you are 40 years of age or older, have your blood pressure checked every year. You should have your blood pressure measured twice-once when you are at a hospital or clinic, and once when you are not at a hospital or clinic. Record the average of the two measurements. To check your blood pressure when you are not at a hospital or clinic, you can use: ? An automated blood pressure machine at a pharmacy. ? A home blood pressure monitor.  If you are between 55 years and 79 years old, ask your health care provider if you should take aspirin to prevent strokes.  Have regular diabetes screenings. This  involves taking a blood sample to check your fasting blood sugar level. ? If you are at a normal weight and have a low risk for diabetes, have this test once every three years after 40 years of age. ? If you are overweight and have a high risk for diabetes, consider being tested at a younger age or more often. Preventing infection Hepatitis B  If you have a higher risk for hepatitis B, you should be screened for this virus. You are considered at high risk for hepatitis B if: ? You were born in a country where hepatitis B is common. Ask your health care provider which countries are considered high risk. ? Your parents were born in a high-risk country, and you have not been immunized against hepatitis B (hepatitis B vaccine). ? You have HIV or AIDS. ? You use needles to inject street drugs. ? You live with someone who has hepatitis B. ? You have had sex with someone who has hepatitis B. ? You get hemodialysis treatment. ? You take certain medicines for conditions, including cancer, organ transplantation, and autoimmune conditions.  Hepatitis C  Blood testing is recommended for: ? Everyone born from 1945 through 1965. ? Anyone with known risk factors for hepatitis C.  Sexually transmitted infections (STIs)  You should be screened for sexually transmitted infections (STIs) including gonorrhea and chlamydia if: ? You are sexually active and are younger than 40 years of age. ? You are older than 40 years of age and your health care provider tells you that you are at risk for this type of infection. ? Your sexual activity has changed since you were last screened and you are at an increased risk for chlamydia or gonorrhea. Ask your health care provider if you are at risk.  If you do not have HIV, but are at risk, it may be recommended that you take a prescription medicine daily to prevent HIV infection. This is called pre-exposure prophylaxis (PrEP). You are considered at risk if: ? You are  sexually active and do not regularly use condoms or know the HIV status of your partner(s). ? You take drugs by injection. ? You are sexually active with a partner who has HIV.  Talk with your health care provider about whether you are at high risk of being infected with HIV. If you choose to begin PrEP, you should first be tested for HIV. You should then be tested every 3 months for as long as you are taking   PrEP. Pregnancy  If you are premenopausal and you may become pregnant, ask your health care provider about preconception counseling.  If you may become pregnant, take 400 to 800 micrograms (mcg) of folic acid every day.  If you want to prevent pregnancy, talk to your health care provider about birth control (contraception). Osteoporosis and menopause  Osteoporosis is a disease in which the bones lose minerals and strength with aging. This can result in serious bone fractures. Your risk for osteoporosis can be identified using a bone density scan.  If you are 65 years of age or older, or if you are at risk for osteoporosis and fractures, ask your health care provider if you should be screened.  Ask your health care provider whether you should take a calcium or vitamin D supplement to lower your risk for osteoporosis.  Menopause may have certain physical symptoms and risks.  Hormone replacement therapy may reduce some of these symptoms and risks. Talk to your health care provider about whether hormone replacement therapy is right for you. Follow these instructions at home:  Schedule regular health, dental, and eye exams.  Stay current with your immunizations.  Do not use any tobacco products including cigarettes, chewing tobacco, or electronic cigarettes.  If you are pregnant, do not drink alcohol.  If you are breastfeeding, limit how much and how often you drink alcohol.  Limit alcohol intake to no more than 1 drink per day for nonpregnant women. One drink equals 12 ounces of  beer, 5 ounces of wine, or 1 ounces of hard liquor.  Do not use street drugs.  Do not share needles.  Ask your health care provider for help if you need support or information about quitting drugs.  Tell your health care provider if you often feel depressed.  Tell your health care provider if you have ever been abused or do not feel safe at home. This information is not intended to replace advice given to you by your health care provider. Make sure you discuss any questions you have with your health care provider. Document Released: 03/13/2011 Document Revised: 02/03/2016 Document Reviewed: 06/01/2015 Elsevier Interactive Patient Education  2018 [employer]   

## 2017-11-19 NOTE — Progress Notes (Signed)
Patient ID: Teresa Arnold, female    DOB: 12/01/1975, 42 y.o.   MRN: [id number]  PCP: Bing Neighbors, FNP  Chief Complaint  Patient presents with  . Annual Exam    Pap smear    Subjective:  HPI Teresa Arnold is a 42 y.o. female with MS, Obesity, GERD, presents for annual physical exam. She reports today that she has no new concerns for complaints. MS symptoms have been well-managed and she is currently receiving immunotherapy for management of symptoms. She is followed by Dr. Stephanie Acre, MD at [facility] with her next follow-up scheduled for 11/21/2017. She reports experiencing an occasional headache that are relieved with NSAIDS and or caffeine. Headaches are not accompanied by visual disturbances, dizziness, or weakness. She is here today to obtain her PAP. Denies vaginal itching, burning,or dysuria. She is having regular periods. No-routine self breast exam. Social History   Socioeconomic History  . Marital status: Single    Spouse name: Not on file  . Number of children: 4  . Years of education: 44  . Highest education level: Not on file  Social Needs  . Financial resource strain: Not on file  . Food insecurity - worry: Not on file  . Food insecurity - inability: Not on file  . Transportation needs - medical: Not on file  . Transportation needs - non-medical: Not on file  Occupational History  . Not on file  Tobacco Use  . Smoking status: Never Smoker  . Smokeless tobacco: Never Used  Substance and Sexual Activity  . Alcohol use: Yes    Alcohol/week: 0.0 oz    Comment: occasionally  . Drug use: No  . Sexual activity: Yes    Birth control/protection: None  Other Topics Concern  . Not on file  Social History Narrative   Patient is single and lives with her family.   Patient has four children.   Patient has a college education.   Patient is right-handed.   Patient does not drink any caffeine.    Family History  Problem Relation Age of  Onset  . Hypertension Mother   . Hypertension Other    Review of Systems  Constitutional: Negative.   HENT: Negative.   Respiratory: Negative.   Cardiovascular: Negative.   Gastrointestinal: Negative.   Genitourinary: Negative.   Musculoskeletal: Positive for arthralgias.       Occasional arthralgias   Skin: Negative.   Neurological: Positive for headaches.  Hematological: Negative.   Psychiatric/Behavioral: Negative.     Patient Active Problem List   Diagnosis Date Noted  . Chronic insomnia 05/22/2017  . Iron deficiency anemia 05/22/2017  . Morbid obesity (HCC) 05/22/2017  . Acid reflux 12/04/2016  . 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 Multiple sclerosis (HCC) 02/13/2013    Allergies  Allergen Reactions  . Other Shortness Of Breath and Itching    Tampons  . Gadolinium Derivatives Nausea And Vomiting    Pt only received half dose of 10ml before getting sick.   . Sulfa Antibiotics Itching and Rash  . Tysabri [Natalizumab] Rash    Prior to Admission medications   Medication Sig Start Date End Date Taking? Authorizing Provider  acetaminophen (TYLENOL) 325 MG tablet Take 650 mg by mouth every 6 (six) hours as needed for moderate pain.    Yes [provider]  ferrous gluconate (FERGON) 324 MG tablet Take 1 tablet (324 mg total) by mouth daily with breakfast. 05/22/17  Yes York Spaniel, MD  oxyCODONE-acetaminophen [facility])  5-325 MG tablet Take 1 tablet by mouth every 4 (four) hours as needed for moderate pain. 10/28/17  Yes Dione Booze, MD  pantoprazole (PROTONIX) 40 MG tablet TAKE 1 TABLET BY MOUTH EVERY DAY 10/15/17  Yes Bing Neighbors, FNP  REBIF REBIDOSE 44 MCG/0.5ML SOAJ INJECT SUBCUTANEOUSLY THREE TIMES A WEEK EVERY MONDAY, WEDNESDAY, AND FRIDAY 08/07/17  Yes York Spaniel, MD  traZODone (DESYREL) 50 MG tablet TAKE 1 TABLET (50 MG TOTAL) AT BEDTIME BY MOUTH. 11/05/17  Yes York Spaniel, MD  valACYclovir (VALTREX) 1000 MG tablet Take 1 tablet  (1,000 mg total) by mouth daily. 08/13/17  Yes Bing Neighbors, FNP    Past Medical, Surgical Family and Social History reviewed and updated.    Objective:   Today's Vitals   11/19/17 1108  BP: 136/88  Pulse: 74  Resp: 16  Temp: 97.9 F (36.6 C)  TempSrc: Oral  SpO2: 100%  Weight: 268 lb (121.6 kg)  Height: 5\' 4"  (1.626 m)    Wt Readings from Last 3 Encounters:  11/19/17 268 lb (121.6 kg)  10/28/17 263 lb (119.3 kg)  05/22/17 271 lb (122.9 kg)   Physical Exam Constitutional: Patient appears well-developed and well-nourished. No distress. HENT: Normocephalic, atraumatic, External right and left ear normal. Oropharynx is clear and moist.  Eyes: Conjunctivae and EOM are normal. PERRLA, no scleral icterus. Neck: Normal ROM. Neck supple. No JVD. No tracheal deviation. No thyromegaly. CVS: RRR, S1/S2 +, no murmurs, no gallops, no carotid bruit.  Pulmonary: Effort and breath sounds normal, no stridor, rhonchi, wheezes, rales.  Abdominal: Soft. BS +, no distension, tenderness, rebound or guarding.  Genitourinary: Breasts are symmetric without cutaneous changes, nipple inversion or discharge. No masses or tenderness, and no axillary lymphadenopathy. Normal female external genitalia without lesion. No inguinal lymphadenopathy. Vaginal mucosa is pink and moist without lesions. Cervix is without discharge, not friable. Pap smear obtained. No cervical motion tenderness, adnexal fullness or tenderness. Musculoskeletal: Normal range of motion. No edema and no tenderness.  Lymphadenopathy: No lymphadenopathy noted, cervical, inguinal or axillary Neuro: Alert. Normal reflexes, muscle tone coordination. No cranial nerve deficit. Skin: Skin is warm and dry. No rash noted. Not diaphoretic. No erythema. No pallor. Psychiatric: Normal mood and affect. Behavior, judgment, thought content normal.   Assessment & Plan:  1. Nonintractable headache, unspecified chronicity pattern, unspecified headache  type, headaches at present are non-worrisome and resolve with conservative measures.   2. Pap smear for cervical cancer screening,  Pap IG, CT/NG w/ reflex HPV when ASC-U  3. Morbid obesity (HCC), Current Body mass index is 46 kg/m. Goal BMI  <25. Encouraged efforts to reduce weight include engaging in physical activity as tolerated with goal of 150 minutes per week. Improve dietary choices and eat a meal regimen consistent with a Mediterranean or DASH diet. Reduce simple carbohydrates. Do not skip meals and eat healthy snacks throughout the day to avoid over-eating at dinner. Set a goal weight loss that is achievable for you.  4. Breast cancer screening, MM Digital Screening, due in September,   5. MS (multiple sclerosis) [facility]), Continue follow-up with neurology and current immunotherapy. Will check CBC with/differential and CMP today.  6. Screening for thyroid disorder, Thyroid Panel With TSH   Orders Placed This Encounter  Procedures  . MM Digital Screening  . CBC with Differential  . Comprehensive metabolic panel  . Thyroid Panel With TSH    RTC: 6 months for chronic condition management   Godfrey Pick. Tiburcio Pea,  MSN, FNP-C [facility]  [address] [place], [place] [address] [phone]

## 2017-11-20 LAB — CBC WITH DIFFERENTIAL/PLATELET
BASOS: 1 %
Basophils Absolute: 0 10*3/uL (ref 0.0–0.2)
EOS (ABSOLUTE): 0.1 10*3/uL (ref 0.0–0.4)
Eos: 1 %
Hematocrit: 33.9 % — ABNORMAL LOW (ref 34.0–46.6)
Hemoglobin: 10.6 g/dL — ABNORMAL LOW (ref 11.1–15.9)
Immature Grans (Abs): 0 10*3/uL (ref 0.0–0.1)
Immature Granulocytes: 0 %
LYMPHS ABS: 1.8 10*3/uL (ref 0.7–3.1)
Lymphs: 43 %
MCH: 24.7 pg — AB (ref 26.6–33.0)
MCHC: 31.3 g/dL — AB (ref 31.5–35.7)
MCV: 79 fL (ref 79–97)
MONOS ABS: 0.5 10*3/uL (ref 0.1–0.9)
Monocytes: 12 %
NEUTROS PCT: 43 %
Neutrophils Absolute: 1.8 10*3/uL (ref 1.4–7.0)
PLATELETS: 365 10*3/uL (ref 150–379)
RBC: 4.3 x10E6/uL (ref 3.77–5.28)
RDW: 15.1 % (ref 12.3–15.4)
WBC: 4.2 10*3/uL (ref 3.4–10.8)

## 2017-11-20 LAB — COMPREHENSIVE METABOLIC PANEL
A/G RATIO: 1.3 (ref 1.2–2.2)
ALT: 12 IU/L (ref 0–32)
AST: 19 IU/L (ref 0–40)
Albumin: 4.4 g/dL (ref 3.5–5.5)
Alkaline Phosphatase: 80 IU/L (ref 39–117)
BUN/Creatinine Ratio: 22 (ref 9–23)
BUN: 15 mg/dL (ref 6–20)
CHLORIDE: 105 mmol/L (ref 96–106)
CO2: 24 mmol/L (ref 20–29)
Calcium: 9.3 mg/dL (ref 8.7–10.2)
Creatinine, Ser: 0.67 mg/dL (ref 0.57–1.00)
GFR calc Af Amer: 128 mL/min/{1.73_m2} (ref >59)
GFR calc non Af Amer: 111 mL/min/{1.73_m2} (ref >59)
Globulin, Total: 3.5 g/dL (ref 1.5–4.5)
Glucose: 80 mg/dL (ref 65–99)
POTASSIUM: 3.9 mmol/L (ref 3.5–5.2)
SODIUM: 144 mmol/L (ref 134–144)
Total Protein: 7.9 g/dL (ref 6.0–8.5)

## 2017-11-20 LAB — THYROID PANEL WITH TSH
Free Thyroxine Index: 1.7 (ref 1.2–4.9)
T3 UPTAKE RATIO: 28 % (ref 24–39)
T4 TOTAL: 6 ug/dL (ref 4.5–12.0)
TSH: 1.26 u[IU]/mL (ref 0.450–4.500)

## 2017-11-21 ENCOUNTER — Encounter: Payer: Self-pay | Admitting: Neurology

## 2017-11-21 ENCOUNTER — Ambulatory Visit (INDEPENDENT_AMBULATORY_CARE_PROVIDER_SITE_OTHER): Payer: Medicare Other | Admitting: Neurology

## 2017-11-21 VITALS — BP 137/76 | HR 80 | Ht 64.0 in | Wt 258.5 lb

## 2017-11-21 DIAGNOSIS — G35 Multiple sclerosis: Secondary | ICD-10-CM
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
## 2017-11-22 LAB — PAP IG, CT-NG, RFX HPV ASCU
Chlamydia, Nuc. Acid Amp: NEGATIVE
GONOCOCCUS BY NUCLEIC ACID AMP: NEGATIVE
PAP Smear Comment: 0

## 2017-11-28 ENCOUNTER — Encounter: Payer: Self-pay | Admitting: Family Medicine

## 2017-11-28 NOTE — Progress Notes (Signed)
Mail lab letter  

## 2017-12-07 DIAGNOSIS — H52223 Regular astigmatism, bilateral: Secondary | ICD-10-CM | POA: Diagnosis not present

## 2017-12-07 DIAGNOSIS — H538 Other visual disturbances: Secondary | ICD-10-CM | POA: Diagnosis not present

## 2017-12-07 DIAGNOSIS — H5213 Myopia, bilateral: Secondary | ICD-10-CM | POA: Diagnosis not present

## 2018-01-16 ENCOUNTER — Encounter: Payer: Self-pay | Admitting: Neurology

## 2018-01-16 ENCOUNTER — Telehealth: Payer: Self-pay | Admitting: Neurology

## 2018-01-16 NOTE — Telephone Encounter (Signed)
I will dictate a letter concerning the diagnosis, the patient actually has very little physical disabilities, possibly mild gait instability but she is able to ambulate relatively normally without assistive device.  I tried to call the patient regarding the letter, unable to reach her.  I dictated a letter stating diagnosis.

## 2018-01-16 NOTE — Progress Notes (Signed)
I

## 2018-01-16 NOTE — Telephone Encounter (Signed)
Pt is requesting a brief note stating her diagnosis. This request is based upon her child having to change schools due to pt not being able to get child to current school as a result of her diagnosis.

## 2018-01-17 ENCOUNTER — Telehealth: Payer: Self-pay | Admitting: Neurology

## 2018-01-17 NOTE — Telephone Encounter (Addendum)
Pt returned RN's call. She will pick up letter today before 5pm..FYI

## 2018-01-17 NOTE — Telephone Encounter (Signed)
Called pt back. She stated at the beginning of the week she noticed increased weakness of legs. Difficult for her to get into standing position. Once she gets to a standing position she is okay. When she walks to the kitchen, legs become very weak where she feels like she is going to fall. She has been trying OTC medications but ineffective (tylenol, ibuprofen). She denies any vision changes. Noticed some mild confusion, but not significant. She wants to come in for infusion but wants to know if she can wait until Monday. She does not want IV catheter in her arm all weekend. She dislikes needles also. Advised I will send message to CW,MD to advise. We will call back.

## 2018-01-17 NOTE — Telephone Encounter (Signed)
Called, LVM for pt to call. If she calls, can let her know letter ready for pick up or we can mail her the letter.

## 2018-01-17 NOTE — Telephone Encounter (Signed)
I called the patient.  The patient indicates that over the last 5 days she has had some onset of bilateral leg weakness and paresthesias, feels fatigue in the legs with walking, she has not yet fallen.  She denies any change in bowel or bladder control.  She believes that her weakness is worsening, we will get her set up for a 3-day course of Solu-Medrol, 500 mg daily.  We will get a revisit for her, she remains on Rebif.

## 2018-01-17 NOTE — Telephone Encounter (Signed)
Noted, placed letter up front for pick up.

## 2018-01-17 NOTE — Telephone Encounter (Signed)
Patient would like a call back from nurse regarding getting an apt for infusion. Best call back [phone]

## 2018-01-18 NOTE — Telephone Encounter (Signed)
Per Inetta Fermo, pt cannot accept appt on 01/31/18 at 730am d/t having to bring kids to school. I call back to offer 02/07/18 at 12pm, check in 1130am instead

## 2018-01-18 NOTE — Telephone Encounter (Signed)
Tina B w/ [facility] is going to call pt this am to schedule infusion. She will also offer 01/31/18 at 730am for work in visit to see Dr. Anne Hahn.

## 2018-01-18 NOTE — Telephone Encounter (Signed)
Called pt. Offered appt for 02/07/18 at 12pm, check in 1130am. 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 I advised there are no other openings available. Asked if she could make arrangements for kids to be brought to school so she can come to appt on 01/31/18 at 730am. She accepted. Advised I will call if anything else opens later in the day. She will call back if she cannot make appt.

## 2018-01-21 DIAGNOSIS — G35 Multiple sclerosis: Secondary | ICD-10-CM | POA: Diagnosis not present

## 2018-01-22 DIAGNOSIS — G35 Multiple sclerosis: Secondary | ICD-10-CM | POA: Diagnosis not present

## 2018-01-22 NOTE — Telephone Encounter (Signed)
I called and offered appt today at 230pm with Dr. Anne Hahn, he had cx. She declined stating she has to pick up kids at school at that time. She will keep appt for 01/31/18 at 730.

## 2018-01-23 DIAGNOSIS — G35 Multiple sclerosis: Secondary | ICD-10-CM | POA: Diagnosis not present

## 2018-01-29 ENCOUNTER — Telehealth: Payer: Self-pay | Admitting: *Deleted

## 2018-01-29 NOTE — Telephone Encounter (Signed)
Submitted PA Rebif on covermymeds. Key: [id number]. Waiting on determination.  Patient has been stable on Rebif since 2014. Has tried/failed Tysabri (allergy). '  "Your information has been submitted to [employer]. [employer] will review the request and will issue a decision, typically within 1-3 days from your submission. You can check the updated outcome later by reopening this request. If [employer] has not responded in 1-3 days or if you have any questions about your ePA request, please contact [employer] at [phone]."

## 2018-01-29 NOTE — Telephone Encounter (Signed)
PA approved via [employer] (prescription drug plan with a medicare contract) effective 10/31/2017-01/29/2019 as non-formulary exception. Any questions: [phone].

## 2018-01-31 ENCOUNTER — Ambulatory Visit (INDEPENDENT_AMBULATORY_CARE_PROVIDER_SITE_OTHER): Payer: Medicare Other | Admitting: Neurology

## 2018-01-31 ENCOUNTER — Telehealth: Payer: Self-pay | Admitting: Neurology

## 2018-01-31 ENCOUNTER — Encounter: Payer: Self-pay | Admitting: Neurology

## 2018-01-31 VITALS — BP 151/98 | HR 97 | Ht 64.5 in | Wt 251.0 lb

## 2018-01-31 DIAGNOSIS — R269 Unspecified abnormalities of gait and mobility: Secondary | ICD-10-CM | POA: Diagnosis not present

## 2018-01-31 DIAGNOSIS — Z5181 Encounter for therapeutic drug level monitoring: Secondary | ICD-10-CM

## 2018-01-31 DIAGNOSIS — G35 Multiple sclerosis: Secondary | ICD-10-CM | POA: Diagnosis not present

## 2018-01-31 MED ORDER — PREDNISONE 10 MG PO TABS: ORAL_TABLET | ORAL | 0 refills | Status: DC

## 2018-01-31 NOTE — Progress Notes (Signed)
Placed [facility] lab in [facility] lock box for routine pick up. 

## 2018-01-31 NOTE — Telephone Encounter (Signed)
Medicare/medicaid order sent to [facility]. They will reach out to the pt to schedule.  °

## 2018-01-31 NOTE — Patient Instructions (Signed)
We will get MRI of the brain and cervical spine. We will get physical therapy set up.  Start a prednisone dose pack.

## 2018-01-31 NOTE — Progress Notes (Signed)
Reason for visit: Multiple sclerosis  Teresa Arnold is an 42 y.o. female  History of present illness:  Teresa Arnold is a 42 year old right-handed black female with a history of morbid obesity and a history of multiple sclerosis.  The patient has been on Rebif, she has tolerated this medication and has been relatively stable over time.  The patient however indicates over the last 2 weeks she began having numbness involving the lower extremities below the knees, increased gait instability, she has fallen on 2 occasions.  She does not believe that there has been any change in bowel or bladder control.  She denies any problems with the arms.  She has not had any change in visual acuity.  She denies any fevers or chills, or any signs of a bladder infection.  She has not missed any doses of the Rebif.  She got a 3-day course of Solu-Medrol, this helped some but her symptoms have returned after the steroids were completed.  She returns for an evaluation.  Past Medical History:  Diagnosis Date  . Bronchitis   . Chronic insomnia 05/22/2017  . GERD (gastroesophageal reflux disease)   . Iron deficiency anemia 05/22/2017  . Morbid obesity (HCC) 05/22/2017  . MS (multiple sclerosis) (HCC)     Past Surgical History:  Procedure Laterality Date  . CESAREAN SECTION     x4    Family History  Problem Relation Age of Onset  . Hypertension Mother   . Hypertension Other     Social history:  reports that she has never smoked. She has never used smokeless tobacco. She reports that she drinks alcohol. She reports that she does not use drugs.    Allergies  Allergen Reactions  . Other Shortness Of Breath and Itching    Tampons  . Gadolinium Derivatives Nausea And Vomiting    Pt only received half dose of 67ml before getting sick.   . Sulfa Antibiotics Itching and Rash  . Tysabri [Natalizumab] Rash    Medications:  Prior to Admission medications   Medication Sig Start Date End Date Taking?  Authorizing Provider  acetaminophen (TYLENOL) 325 MG tablet Take 650 mg by mouth every 6 (six) hours as needed for moderate pain.    Yes [provider]  ferrous gluconate (FERGON) 324 MG tablet Take 1 tablet (324 mg total) by mouth daily with breakfast. 05/22/17  Yes York Spaniel, MD  oxyCODONE-acetaminophen (PERCOCET) 5-325 MG tablet Take 1 tablet by mouth every 4 (four) hours as needed for moderate pain. 10/28/17  Yes Dione Booze, MD  pantoprazole (PROTONIX) 40 MG tablet TAKE 1 TABLET BY MOUTH EVERY DAY 10/15/17  Yes Bing Neighbors, FNP  REBIF REBIDOSE 44 MCG/0.5ML SOAJ INJECT SUBCUTANEOUSLY THREE TIMES A WEEK EVERY MONDAY, WEDNESDAY, AND FRIDAY 08/07/17  Yes York Spaniel, MD  traZODone (DESYREL) 50 MG tablet TAKE 1 TABLET (50 MG TOTAL) AT BEDTIME BY MOUTH. 11/05/17  Yes York Spaniel, MD  valACYclovir (VALTREX) 1000 MG tablet Take 1 tablet (1,000 mg total) by mouth daily. 08/13/17  Yes Bing Neighbors, FNP  predniSONE (DELTASONE) 10 MG tablet Begin taking 6 tablets daily, taper by one tablet every other day until off the medication. 01/31/18   York Spaniel, MD    ROS:  Out of a complete 14 system review of symptoms, the patient complains only of the following symptoms, and all other reviewed systems are negative.  Blurred vision Urinary urgency Dizziness  Blood pressure (!) 151/98,  pulse 97, height 5' 4.5" (1.638 m), weight 251 lb (113.9 kg).  Physical Exam  General: The patient is alert and cooperative at the time of the examination.  The patient is markedly obese.  Skin: No significant peripheral edema is noted.   Neurologic Exam  Mental status: The patient is alert and oriented x 3 at the time of the examination. The patient has apparent normal recent and remote memory, with an apparently normal attention span and concentration ability.   Cranial nerves: Facial symmetry is present. Speech is normal, no aphasia or dysarthria is noted.  Extraocular movements are full. Visual fields are full.  Pupils are equal, round, and reactive to light.  Discs are flat bilaterally.  Motor: The patient has good strength in all 4 extremities.  Sensory examination: Soft touch sensation is symmetric on the face, arms, and legs.  Coordination: The patient has good finger-nose-finger and heel-to-shin bilaterally.  Gait and station: The patient has a slightly wide-based gait, the patient can walk independently but is slightly unsteady.  Tandem gait was not attempted.  Romberg is negative, but is unsteady. No drift is seen.  Reflexes: Deep tendon reflexes are symmetric.   Assessment/Plan:  1.  Multiple sclerosis with recent exacerbation  2.  Gait disorder  3.  Morbid obesity  The patient will be set up for blood work and urinalysis today.  MRI of the brain and cervical spine will be done with and without gadolinium enhancement.  The patient will be placed on a prednisone Dosepak, 10 mg 12-day pack.  The patient will follow-up in about 3 or 4 weeks, we will make a decision at that time whether or not to change medical therapy for her multiple sclerosis.  She will be set up for physical therapy for her gait disturbance.  Marlan Palau MD 01/31/2018 8:05 AM  [facility] [address] [place], [place] [address]  Phone [phone] Fax [phone]

## 2018-02-01 ENCOUNTER — Telehealth: Payer: Self-pay | Admitting: Neurology

## 2018-02-01 MED ORDER — CIPROFLOXACIN HCL 250 MG PO TABS: 250.0000 mg | ORAL_TABLET | Freq: Two times a day (BID) | ORAL | 0 refills | Status: DC

## 2018-02-01 NOTE — Telephone Encounter (Signed)
I called the patient.  The patient appears to have a mild bladder infection, will call in Cipro for her.  She is allergic to sulfa.  The TB test is not yet available, all other blood work is unremarkable.  [facility] viral antibody panel is pending.

## 2018-02-05 LAB — CBC WITH DIFFERENTIAL/PLATELET
BASOS: 0 %
Basophils Absolute: 0 10*3/uL (ref 0.0–0.2)
EOS (ABSOLUTE): 0.1 10*3/uL (ref 0.0–0.4)
EOS: 1 %
HEMATOCRIT: 36.6 % (ref 34.0–46.6)
HEMOGLOBIN: 11.4 g/dL (ref 11.1–15.9)
IMMATURE GRANULOCYTES: 1 %
Immature Grans (Abs): 0 10*3/uL (ref 0.0–0.1)
Lymphocytes Absolute: 1.5 10*3/uL (ref 0.7–3.1)
Lymphs: 27 %
MCH: 24.6 pg — ABNORMAL LOW (ref 26.6–33.0)
MCHC: 31.1 g/dL — ABNORMAL LOW (ref 31.5–35.7)
MCV: 79 fL (ref 79–97)
MONOCYTES: 13 %
MONOS ABS: 0.7 10*3/uL (ref 0.1–0.9)
NEUTROS PCT: 58 %
Neutrophils Absolute: 3.3 10*3/uL (ref 1.4–7.0)
Platelets: 293 10*3/uL (ref 150–450)
RBC: 4.64 x10E6/uL (ref 3.77–5.28)
RDW: 14.9 % (ref 12.3–15.4)
WBC: 5.7 10*3/uL (ref 3.4–10.8)

## 2018-02-05 LAB — QUANTIFERON-TB GOLD PLUS
QUANTIFERON TB1 AG VALUE: 0.05 [IU]/mL
QuantiFERON Mitogen Value: 7.62 IU/mL
QuantiFERON Nil Value: 0.02 IU/mL
QuantiFERON TB2 Ag Value: 0.03 IU/mL
QuantiFERON-TB Gold Plus: NEGATIVE

## 2018-02-05 LAB — URINALYSIS, ROUTINE W REFLEX MICROSCOPIC
BILIRUBIN UA: NEGATIVE
Glucose, UA: NEGATIVE
Ketones, UA: NEGATIVE
NITRITE UA: POSITIVE — AB
PROTEIN UA: NEGATIVE
RBC UA: NEGATIVE
Specific Gravity, UA: 1.024 (ref 1.005–1.030)
UUROB: 0.2 mg/dL (ref 0.2–1.0)
pH, UA: 6 (ref 5.0–7.5)

## 2018-02-05 LAB — COMPREHENSIVE METABOLIC PANEL
ALBUMIN: 4 g/dL (ref 3.5–5.5)
ALT: 13 IU/L (ref 0–32)
AST: 11 IU/L (ref 0–40)
Albumin/Globulin Ratio: 1.1 — ABNORMAL LOW (ref 1.2–2.2)
Alkaline Phosphatase: 88 IU/L (ref 39–117)
BUN / CREAT RATIO: 16 (ref 9–23)
BUN: 12 mg/dL (ref 6–20)
Bilirubin Total: 0.3 mg/dL (ref 0.0–1.2)
CALCIUM: 9.3 mg/dL (ref 8.7–10.2)
CO2: 23 mmol/L (ref 20–29)
Chloride: 101 mmol/L (ref 96–106)
Creatinine, Ser: 0.75 mg/dL (ref 0.57–1.00)
GFR calc Af Amer: 116 mL/min/{1.73_m2} (ref >59)
GFR, EST NON AFRICAN AMERICAN: 101 mL/min/{1.73_m2} (ref >59)
GLOBULIN, TOTAL: 3.6 g/dL (ref 1.5–4.5)
Glucose: 85 mg/dL (ref 65–99)
Potassium: 3.9 mmol/L (ref 3.5–5.2)
SODIUM: 139 mmol/L (ref 134–144)
Total Protein: 7.6 g/dL (ref 6.0–8.5)

## 2018-02-05 LAB — MICROSCOPIC EXAMINATION: Casts: NONE SEEN /lpf

## 2018-02-05 LAB — HEPATITIS B CORE ANTIBODY, TOTAL: HEP B C TOTAL AB: NEGATIVE

## 2018-02-05 LAB — VARICELLA ZOSTER ANTIBODY, IGG: Varicella zoster IgG: >4000 index (ref >165)

## 2018-02-05 LAB — HEPATITIS C ANTIBODY: Hep C Virus Ab: <0.1 s/co ratio (ref 0.0–0.9)

## 2018-02-06 ENCOUNTER — Telehealth: Payer: Self-pay | Admitting: Neurology

## 2018-02-06 DIAGNOSIS — G35 Multiple sclerosis: Secondary | ICD-10-CM

## 2018-02-06 NOTE — Telephone Encounter (Signed)
[facility] viral antibody panel was positive, index of 0.85

## 2018-02-12 NOTE — Telephone Encounter (Signed)
FYI Pt has called back in response to call from Dr Anne Hahn.  Pt wants Dr Anne Hahn to be aware that she has been told that Dr Anne Hahn needs to call [facility] himself to request the order be sent without contrast since pt is allergic to contrast.  Pt has not requested a call back unless there is something more that needs to be relayed to her.

## 2018-02-12 NOTE — Telephone Encounter (Signed)
Apparently the gadolinium causes some nausea with this patient, she does not wish to have contrast with MRI.  I will reorder the studies without contrast.

## 2018-02-12 NOTE — Addendum Note (Signed)
Addended by: York Spaniel on: 02/12/2018 05:01 PM   Modules accepted: Orders

## 2018-02-13 ENCOUNTER — Telehealth: Payer: Self-pay | Admitting: Neurology

## 2018-02-13 NOTE — Telephone Encounter (Signed)
Medicare/medicaid order sent to [facility]. No auth they will reach out to the pt to schedule.  °

## 2018-02-22 ENCOUNTER — Ambulatory Visit
Admission: RE | Admit: 2018-02-22 | Discharge: 2018-02-22 | Disposition: A | Discharge status: Home / Self Care | Payer: Medicare Other | Source: Ambulatory Visit | Attending: Neurology | Admitting: Neurology

## 2018-02-22 ENCOUNTER — Telehealth: Payer: Self-pay | Admitting: Neurology

## 2018-02-22 DIAGNOSIS — G35 Multiple sclerosis: Secondary | ICD-10-CM

## 2018-02-22 NOTE — Telephone Encounter (Signed)
  I called the patient.  MRI of the brain does not show new definite lesions, possible new lesions from 2012 on the spinal cord, not sure, may be artifactual.  I discussed this with the patient, clinically, she has had some worsening of her gait stability and numbness in the legs.  We may consider a more aggressive MS therapy in the future, I will be seeing her in the next several weeks.   MRI brain 02/22/18:  IMPRESSION: This MRI of the brain without contrast shows the following: 1.    Multiple T2/FLAIR hyperintense foci in the brainstem, cerebellum, left thalamus and hemispheres in a pattern and configuration consistent with chronic demyelinating plaque associated with multiple sclerosis.  When compared to the MRI dated 06/05/2017, there are no definite new lesions.   2.    There is a partially empty sella turcica.  This could be an incidental finding or related to elevated intracranial pressures.  This appears unchanged compared to the previous MRI.    MRI cervical 02/22/18:  IMPRESSION: This MRI of the cervical spine without contrast shows the following: 1.  Multiple T2/FLAIR hyperintense foci within the spinal cord.  When compared to the MRI dated 04/06/2011, there might be 2 additional foci noted on the sagittal images at the adjacent to C3-C4 and C4-C5 that were not apparent on the previous MRI.   However, as the difference is only noted on the sagittal images and not on the axial images, this could be artifact. 2.  Mild disc degenerative changes at C3-C4 and C4-C5 that do not lead to nerve root compression.

## 2018-03-04 ENCOUNTER — Telehealth: Payer: Self-pay | Admitting: Neurology

## 2018-03-04 ENCOUNTER — Ambulatory Visit (INDEPENDENT_AMBULATORY_CARE_PROVIDER_SITE_OTHER): Payer: Medicare Other | Admitting: Neurology

## 2018-03-04 ENCOUNTER — Encounter: Payer: Self-pay | Admitting: Neurology

## 2018-03-04 VITALS — BP 138/91 | HR 68 | Ht 64.5 in | Wt 250.5 lb

## 2018-03-04 DIAGNOSIS — G35 Multiple sclerosis: Secondary | ICD-10-CM

## 2018-03-04 NOTE — Telephone Encounter (Signed)
Patient has requested to keep her follow-up w/ Anne Hahn, MD on 05/24/18.

## 2018-03-04 NOTE — Progress Notes (Signed)
Reason for visit: Multiple sclerosis  Teresa Arnold is an 42 y.o. female  History of present illness:  Teresa Arnold is a 42 year old right-handed black female with history of multiple sclerosis and obesity.  The patient is on Rebif at this time, she has been doing well on the medication until recently.  The patient had onset of bilateral lower extremity numbness and gait instability that started 2 weeks before her evaluation on 31 Jan 2018.  The patient fell on 2 occasions.  The patient was set up for MRI of the brain and cervical spine, MRI of the brain was stable from September 2018, when compared to 2012 scan, there may be two possible new lesions on the spinal cord.  The patient was treated with steroids, she did have a mild bladder infection and was treated for this.  She returns to this office for an evaluation.  She believes that she is back to her usual baseline.  She denies any new symptoms of numbness, weakness, vision changes, or gait instability.  Past Medical History:  Diagnosis Date  . Bronchitis   . Chronic insomnia 05/22/2017  . GERD (gastroesophageal reflux disease)   . Iron deficiency anemia 05/22/2017  . Morbid obesity (HCC) 05/22/2017  . MS (multiple sclerosis) (HCC)     Past Surgical History:  Procedure Laterality Date  . CESAREAN SECTION     x4    Family History  Problem Relation Age of Onset  . Hypertension Mother   . Hypertension Other     Social history:  reports that she has never smoked. She has never used smokeless tobacco. She reports that she drinks alcohol. She reports that she does not use drugs.    Allergies  Allergen Reactions  . Other Shortness Of Breath and Itching    Tampons  . Gadolinium Derivatives Nausea And Vomiting    Pt only received half dose of 45ml before getting sick.   . Sulfa Antibiotics Itching and Rash  . Tysabri [Natalizumab] Rash    Medications:  Prior to Admission medications   Medication Sig Start Date End  Date Taking? Authorizing Provider  acetaminophen (TYLENOL) 325 MG tablet Take 650 mg by mouth every 6 (six) hours as needed for moderate pain.    Yes [provider]  ferrous gluconate (FERGON) 324 MG tablet Take 1 tablet (324 mg total) by mouth daily with breakfast. 05/22/17  Yes York Spaniel, MD  oxyCODONE-acetaminophen (PERCOCET) 5-325 MG tablet Take 1 tablet by mouth every 4 (four) hours as needed for moderate pain. 10/28/17  Yes Dione Booze, MD  pantoprazole (PROTONIX) 40 MG tablet TAKE 1 TABLET BY MOUTH EVERY DAY 10/15/17  Yes Bing Neighbors, FNP  predniSONE (DELTASONE) 10 MG tablet Begin taking 6 tablets daily, taper by one tablet every other day until off the medication. 01/31/18  Yes York Spaniel, MD  REBIF REBIDOSE 44 MCG/0.5ML [facility] INJECT SUBCUTANEOUSLY THREE TIMES A WEEK EVERY MONDAY, [facility], AND FRIDAY 08/07/17  Yes York Spaniel, MD  traZODone (DESYREL) 50 MG tablet TAKE 1 TABLET (50 MG TOTAL) AT BEDTIME BY MOUTH. 11/05/17  Yes York Spaniel, MD  valACYclovir (VALTREX) 1000 MG tablet Take 1 tablet (1,000 mg total) by mouth daily. 08/13/17  Yes Bing Neighbors, FNP    ROS:  Out of a complete 14 system review of symptoms, the patient complains only of the following symptoms, and all other reviewed systems are negative.  Restless legs, insomnia Numbness  Blood pressure Marland Kitchen)  138/91, pulse 68, height 5' 4.5" (1.638 m), weight 250 lb 8 oz (113.6 kg).  Physical Exam  General: The patient is alert and cooperative at the time of the examination.  The patient is markedly obese.  Skin: No significant peripheral edema is noted.   Neurologic Exam  Mental status: The patient is alert and oriented x 3 at the time of the examination. The patient has apparent normal recent and remote memory, with an apparently normal attention span and concentration ability.   Cranial nerves: Facial symmetry is present. Speech is normal, no aphasia or dysarthria is  noted. Extraocular movements are full. Visual fields are full.  Motor: The patient has good strength in all 4 extremities, with exception of some slight weakness with hip flexion bilaterally.  Sensory examination: Soft touch sensation is symmetric on the face, arms, and legs.  Coordination: The patient has good finger-nose-finger and heel-to-shin bilaterally.  Gait and station: The patient has a normal gait. Tandem gait is slightly unsteady. Romberg is negative, but is slightly unsteady. No drift is seen.  Reflexes: Deep tendon reflexes are symmetric.   Assessment/Plan:  1.  Multiple sclerosis  The patient likely had a recent exacerbation.  We discussed increasing the aggressiveness of therapy.  I talked about potentially starting Gilenya or Mayzent, I gave her information on Mayzent and an application.  If she decides that she wishes to initiate treatment, she will sign the form and bring it back.  The patient will require an ophthalmologic evaluation and an EKG before starting the drug.  She will also need genetic testing.  She will follow-up in 5 months, sooner if needed.  Marlan Palau MD 03/04/2018 12:22 PM  [facility] [address] [place], [place] [address]  Phone [phone] Fax [phone]

## 2018-03-11 ENCOUNTER — Telehealth: Payer: Self-pay | Admitting: *Deleted

## 2018-03-11 NOTE — Telephone Encounter (Signed)
Faxed completed/signed Mayzent start form to [employer] at [phone]. Received fax confirmation.

## 2018-03-11 NOTE — Telephone Encounter (Signed)
Received fax notification from [facility] that they received referral. They will notify if PA needed.

## 2018-03-12 NOTE — Telephone Encounter (Signed)
I called pt. Dr. Anne Hahn advised her eye exam should be done within the last 4-5 months. If it's been longer than that, she will have to have another one. She advised she had it done in March or April 2019. She will fax results to [phone] Christiane Ha. Advised I will call her once we receive the results. She verbalized understanding.

## 2018-03-12 NOTE — Telephone Encounter (Signed)
Received fax notification from [employer] that PA approved ffrom 12/12/2017-03/12/2019 as non-formulary exception under pt Medicare part D prescription drug plan.  Specialty dashboard key: [id number] on covermymeds. I called [employer] and pt is scheduled for ECG on 03/16/18 and genotype lab draw no 03/16/18.  She declined eye exam stating she already had this completed this past year and will get the results to our office.

## 2018-03-12 NOTE — Telephone Encounter (Addendum)
PA submitted on [employer]. Key: [id number]. Waiting on determination.  "Your information has been submitted to [employer]. [employer] will review the request and will issue a decision, typically within 1-3 days from your submission. If [employer] has not responded in 1-3 days or if you have any questions about your ePA request, please contact [employer] at [phone]"  Pt had tried/failed: Tysabri (had allergic reaction) and rebif. Dr. Anne Hahn wanted pt to go to a more aggressive treatment for MS.

## 2018-03-16 DIAGNOSIS — G35 Multiple sclerosis: Secondary | ICD-10-CM | POA: Diagnosis not present

## 2018-03-18 NOTE — Telephone Encounter (Signed)
Received records from [facility] and [facility] urgent care. Eye exam completed back on 12/07/17 had impression: "Blurred vision, bilateral, regular astigmatism of both eyes, myopia of both eyes. No retinal holes or breaks". Pt had ECG at [facility] that showed normal sinus rhythm, P/PR: 112/115ms, QRS: 84ms, QT/QTc: 422/424ms, P/QRS/T axis: 53/34/27 deg, heart rate: 73bpm".  Dr. Epimenio Foot reviewed d/t Dr. Anne Hahn being out. He is the WID this am. He ok'd pt to proceed with Mayzent still given these findings. Still waiting on genotype lab.  She had the following labs completed at [facility]: CBC, LFT, VZV, GTT

## 2018-03-20 ENCOUNTER — Telehealth: Payer: Self-pay | Admitting: Neurology

## 2018-03-20 NOTE — Telephone Encounter (Signed)
Pt called stating she is returning RNs call wished to leave a message only stating that "Mrs. Roxan Hockey called". Pt states RN will know what the call is regarding. Pt also aware RN is currently out of office and will return tomorrow 7/11

## 2018-03-21 NOTE — Telephone Encounter (Addendum)
I called pt. She wanted to see where she was at with Mayzent and if we received labs/ECG results. Advised we did and Dr. Epimenio Foot here said so far everything looks ok for her to start Mayzent. She is having genotype lab draw at home today. Advised once those results come back, depending on results we can then initiate therapy with Mayzent. I will keep her up to date. She verbalized understanding.

## 2018-03-26 NOTE — Telephone Encounter (Signed)
Called Mayzent [employer]) and spoke with [place]. Genotype lab still pending. Can take up to two weeks to come back. They will notify us once resulted. They will make note in chart that it is still pending.

## 2018-03-27 ENCOUNTER — Telehealth: Payer: Self-pay | Admitting: Neurology

## 2018-03-27 NOTE — Telephone Encounter (Signed)
I called Teresa Arnold back and verified she had genotype lab completed and came back *1/*1 ratio.  She can start 2mg  dose of Mayzent. They will send starter pack: Day1: 1x0.25mg , Day 2: 1x0.25, Day 3: 2x0.25mg , Day 4: 3x0.25mg , Day 5: 5x0.25mg . They will then send one month free of maintenance 2mg  daily dose. She will then receive medication from different pharmacy after that. Nothing further needed.

## 2018-03-27 NOTE — Telephone Encounter (Signed)
error 

## 2018-03-27 NOTE — Telephone Encounter (Addendum)
Received CYP2C9 genotype lab result back from [facility] via fax.  Result: *1/*1. The standard maintenance dose (2mg  once daily) is recommended. I spoke with Dr. Epimenio Foot. He reviewed and cleared pt to start Mayzent therapy.   I called pt and relayed lab results and advised Dr. Epimenio Foot cleared her to start medication since Dr. Anne Hahn is still out. She verbalized understanding and will call if she has further questions/concerns.   I cleared pt on covermymeds specialty dashboard.

## 2018-03-27 NOTE — Telephone Encounter (Signed)
Peter with [facility] calling to verify dosage for Siponimod Fumarate (MAYZENT PO) and to find out if patient has had genotype testing. He can be reached at [phone].

## 2018-04-01 ENCOUNTER — Telehealth: Payer: Self-pay | Admitting: Neurology

## 2018-04-01 NOTE — Addendum Note (Signed)
Addended by: York Spaniel on: 04/01/2018 04:09 PM   Modules accepted: Orders

## 2018-04-01 NOTE — Telephone Encounter (Signed)
I called pt back. She is still on prednisone 10mg  tab taking 1 tab every other day. She has 8 tablets left. Per last office note from Dr. Anne Hahn on 01/31/18 when rx prescribed: "The patient will be placed on a prednisone Dosepak, 10 mg 12-day pack.". Advised pt I will have to clarify with WID and call her back to let her know how to proceed. She is scheduled to receive Mayzent in the mail tomorrow between 8-12pm.

## 2018-04-01 NOTE — Telephone Encounter (Signed)
Pt is asking for a call back, she said [employer] told her to ask if she is to stop her predniSONE (DELTASONE) 10 MG tablet or finish it before she starts her Mayzent.  Please call

## 2018-04-01 NOTE — Telephone Encounter (Signed)
I called pt back and advised I will clarify with Dr. Anne Hahn in the morning and call back to let her know if she needs to hold off on starting Mayzent or ok to start. She verbalized understanding.

## 2018-04-01 NOTE — Telephone Encounter (Signed)
I called the patient.  The patient will complete the course of prednisone before she starts the Mayzent.  She of course has stopped the Rebif.

## 2018-05-20 ENCOUNTER — Telehealth: Payer: Self-pay | Admitting: Neurology

## 2018-05-20 NOTE — Telephone Encounter (Signed)
Teresa Arnold with [facility] requesting a call back at [phone] stating she is needing to know if the pt is still taking Rebif, if not when did the pt stop taking medications and did she switch to a different medication. Please advise

## 2018-05-20 NOTE — Telephone Encounter (Signed)
Called Teresa Arnold back. Answered clinical questions. Advised pt stopped Rebif and started on Mayzent. Nothing further needed.

## 2018-05-22 ENCOUNTER — Encounter: Payer: Self-pay | Admitting: Family Medicine

## 2018-05-22 ENCOUNTER — Ambulatory Visit (INDEPENDENT_AMBULATORY_CARE_PROVIDER_SITE_OTHER): Payer: Medicare Other | Admitting: Family Medicine

## 2018-05-22 VITALS — BP 132/78 | HR 74 | Temp 98.0°F | Ht 64.5 in | Wt 249.0 lb

## 2018-05-22 DIAGNOSIS — Z6841 Body Mass Index (BMI) 40.0 and over, adult: Secondary | ICD-10-CM | POA: Diagnosis not present

## 2018-05-22 DIAGNOSIS — Z09 Encounter for follow-up examination after completed treatment for conditions other than malignant neoplasm: Secondary | ICD-10-CM

## 2018-05-22 DIAGNOSIS — Z9189 Other specified personal risk factors, not elsewhere classified: Secondary | ICD-10-CM

## 2018-05-22 DIAGNOSIS — Z1231 Encounter for screening mammogram for malignant neoplasm of breast: Secondary | ICD-10-CM | POA: Diagnosis not present

## 2018-05-22 DIAGNOSIS — D509 Iron deficiency anemia, unspecified: Secondary | ICD-10-CM | POA: Diagnosis not present

## 2018-05-22 DIAGNOSIS — F5104 Psychophysiologic insomnia: Secondary | ICD-10-CM

## 2018-05-22 DIAGNOSIS — Z131 Encounter for screening for diabetes mellitus: Secondary | ICD-10-CM

## 2018-05-22 DIAGNOSIS — G35 Multiple sclerosis: Secondary | ICD-10-CM | POA: Diagnosis not present

## 2018-05-22 DIAGNOSIS — Z1239 Encounter for other screening for malignant neoplasm of breast: Secondary | ICD-10-CM

## 2018-05-22 DIAGNOSIS — Z0000 Encounter for general adult medical examination without abnormal findings: Secondary | ICD-10-CM | POA: Diagnosis not present

## 2018-05-22 DIAGNOSIS — G35D Multiple sclerosis, unspecified: Secondary | ICD-10-CM

## 2018-05-22 NOTE — Progress Notes (Signed)
Follow Up  Subjective:    Patient ID: Teresa Arnold, female    DOB: Feb 06, 1976, 42 y.o.   MRN: [id number]  Chief Complaint  Patient presents with  . Follow-up    Health Maintence   HPI  Teresa Arnold is a 42 year old female with a past medical history of MS, Morbid Obesity, Iron Def Anemia, GERD, Chronic Insomnia, and Bronchitis. She is here today for follow up.  Current Status: Since her last office visit, she is doing well with no complaints. She has occasional headaches and dizziness.   She denies fevers, chills, fatigue, recent infections, weight loss, and night sweats. She has not had any  visual changes, and falls. No chest pain, heart palpitations, cough and shortness of breath reported. No reports of GI problems such as nausea, vomiting, diarrhea, and constipation. She has no reports of blood in stools, dysuria and hematuria. No depression or anxiety, and denies suicidal ideations, homicidal ideations, or auditory hallucinations. She denies pain today.   Past Medical History:  Diagnosis Date  . Bronchitis   . Chronic insomnia 05/22/2017  . GERD (gastroesophageal reflux disease)   . Iron deficiency anemia 05/22/2017  . Morbid obesity (HCC) 05/22/2017  . MS (multiple sclerosis) (HCC)     Family History  Problem Relation Age of Onset  . Hypertension Mother   . Hypertension Other     Social History   Socioeconomic History  . Marital status: Single    Spouse name: Not on file  . Number of children: 4  . Years of education: 40  . Highest education level: Not on file  Occupational History  . Not on file  Social Needs  . Financial resource strain: Not on file  . Food insecurity:    Worry: Not on file    Inability: Not on file  . Transportation needs:    Medical: Not on file    Non-medical: Not on file  Tobacco Use  . Smoking status: Never Smoker  . Smokeless tobacco: Never Used  Substance and Sexual Activity  . Alcohol use: Yes    Alcohol/week: 0.0 standard  drinks    Comment: occasionally  . Drug use: No  . Sexual activity: Yes    Birth control/protection: None  Lifestyle  . Physical activity:    Days per week: Not on file    Minutes per session: Not on file  . Stress: Not on file  Relationships  . Social connections:    Talks on phone: Not on file    Gets together: Not on file    Attends religious service: Not on file    Active member of club or organization: Not on file    Attends meetings of clubs or organizations: Not on file    Relationship status: Not on file  . Intimate partner violence:    Fear of current or ex partner: Not on file    Emotionally abused: Not on file    Physically abused: Not on file    Forced sexual activity: Not on file  Other Topics Concern  . Not on file  Social History Narrative   Patient is single and lives with her family.   Patient has four children.   Patient has a college education.   Patient is right-handed.   Patient does not drink any caffeine.    Past Surgical History:  Procedure Laterality Date  . CESAREAN SECTION     x4     There is no immunization history on  file for this patient.  Current Meds  Medication Sig  . acetaminophen (TYLENOL) 325 MG tablet Take 650 mg by mouth every 6 (six) hours as needed for moderate pain.   . pantoprazole (PROTONIX) 40 MG tablet TAKE 1 TABLET BY MOUTH EVERY DAY  . Siponimod Fumarate (MAYZENT PO) Take by mouth.  . valACYclovir (VALTREX) 1000 MG tablet Take 1 tablet (1,000 mg total) by mouth daily.   Allergies  Allergen Reactions  . Other Shortness Of Breath and Itching    Tampons  . Gadolinium Derivatives Nausea And Vomiting    Pt only received half dose of 10ml before getting sick.   . Sulfa Antibiotics Itching and Rash  . Tysabri [Natalizumab] Rash    BP 132/78 (BP Location: Left Arm, Patient Position: Sitting, Cuff Size: Large)   Pulse 74   Temp 98 F (36.7 C) (Oral)   Ht 5' 4.5" (1.638 m)   Wt 249 lb (112.9 kg)   LMP 04/26/2018    SpO2 100%   BMI 42.08 kg/m    Review of Systems  Constitutional: Positive for fatigue.  Eyes: Negative.   Cardiovascular: Negative.   Gastrointestinal: Positive for abdominal distention (Obese).  Endocrine: Negative.   Musculoskeletal: Negative.   Skin: Negative.   Allergic/Immunologic: Negative.   Neurological: Positive for dizziness (occasional) and headaches (frequent).    Objective:   Physical Exam  Constitutional: She is oriented to person, place, and time. She appears well-nourished.  Cardiovascular: Normal rate, regular rhythm, normal heart sounds and intact distal pulses.  Pulmonary/Chest: Effort normal and breath sounds normal.  Abdominal: Soft. Bowel sounds are normal. She exhibits distension (Obese).  Musculoskeletal: Normal range of motion.  Neurological: She is alert and oriented to person, place, and time.  Skin: Skin is warm.  Psychiatric: She has a normal mood and affect. Her behavior is normal. Judgment normal.   Assessment & Plan:   1. MS (multiple sclerosis) (HCC) She is doing well. She continues Siponimod Fumarate as prescribed.   2. Morbid obesity (HCC) BMI is 42.08 today. Her goal BMI is <25. Encouraged efforts to reduce weight include engaging in physical activity as tolerated with goal of 150 minutes per week. Improve dietary choices and eat a meal regimen consistent with a Mediterranean or DASH diet. Reduce simple carbohydrates. Do not skip meals and eat healthy snacks throughout the day to avoid over-eating at dinner. Set a goal weight loss that is achievable for you.  3. Screening for diabetes mellitus - POCT glycosylated hemoglobin (Hb A1C) - POCT urinalysis dipstick  4. Breast cancer screening She is due for breast cancer screening, however she is not ready. She will inform us when she is ready for Mammogram.   5. Chronic insomnia Continue Trazodone as needed for sleep.   6. Iron deficiency anemia, unspecified iron deficiency anemia type Hgb  is normal at 11.4 on 01/31/2018.   7. Follow up She will follow up in 6 months.   No orders of the defined types were placed in this encounter.  Raliegh Ip,  MSN, FNP-C Patient Care [facility] [address] [place], [place] [address] [phone]

## 2018-05-24 ENCOUNTER — Ambulatory Visit (INDEPENDENT_AMBULATORY_CARE_PROVIDER_SITE_OTHER): Payer: Medicare Other | Admitting: Neurology

## 2018-05-24 ENCOUNTER — Encounter: Payer: Self-pay | Admitting: Neurology

## 2018-05-24 VITALS — BP 121/85 | HR 80 | Wt 249.0 lb

## 2018-05-24 DIAGNOSIS — G35 Multiple sclerosis: Secondary | ICD-10-CM | POA: Diagnosis not present

## 2018-05-24 DIAGNOSIS — Z5181 Encounter for therapeutic drug level monitoring: Secondary | ICD-10-CM

## 2018-05-24 NOTE — Progress Notes (Signed)
Reason for visit: Multiple sclerosis  Teresa Arnold is an 42 y.o. female  History of present illness:  Teresa Arnold is a 42 year old right-handed black female with a history of multiple sclerosis and morbid obesity.  The patient has been switched to Mayzent, she has developed headaches on the medication that are daily in nature.  She takes Tylenol in the morning which seems to help.  The patient indicates that the headaches are not severe.  Otherwise, she has no side effects on the drug.  She has not had any new symptoms referable to her multiple sclerosis, she denies any changes in vision, balance, numbness or weakness, or any bowel or bladder control issues.  The patient returns for an evaluation.  She takes trazodone at night for sleep.  Past Medical History:  Diagnosis Date  . Bronchitis   . Chronic insomnia 05/22/2017  . GERD (gastroesophageal reflux disease)   . Iron deficiency anemia 05/22/2017  . Morbid obesity (HCC) 05/22/2017  . MS (multiple sclerosis) (HCC)     Past Surgical History:  Procedure Laterality Date  . CESAREAN SECTION     x4    Family History  Problem Relation Age of Onset  . Hypertension Mother   . Hypertension Other     Social history:  reports that she has never smoked. She has never used smokeless tobacco. She reports that she drinks alcohol. She reports that she does not use drugs.    Allergies  Allergen Reactions  . Other Shortness Of Breath and Itching    Tampons  . Gadolinium Derivatives Nausea And Vomiting    Pt only received half dose of 10ml before getting sick.   . Sulfa Antibiotics Itching and Rash  . Tysabri [Natalizumab] Rash    Medications:  Prior to Admission medications   Medication Sig Start Date End Date Taking? Authorizing Provider  acetaminophen (TYLENOL) 325 MG tablet Take 650 mg by mouth every 6 (six) hours as needed for moderate pain.    Yes [provider]  pantoprazole (PROTONIX) 40 MG tablet TAKE 1  TABLET BY MOUTH EVERY DAY 10/15/17  Yes Bing Neighbors, FNP  Siponimod Fumarate (MAYZENT PO) Take by mouth.   Yes [provider]  traZODone (DESYREL) 50 MG tablet TAKE 1 TABLET (50 MG TOTAL) AT BEDTIME BY MOUTH. 11/05/17  Yes York Spaniel, MD  valACYclovir (VALTREX) 1000 MG tablet Take 1 tablet (1,000 mg total) by mouth daily. 08/13/17  Yes Bing Neighbors, FNP    ROS:  Out of a complete 14 system review of symptoms, the patient complains only of the following symptoms, and all other reviewed systems are negative.  Shortness of breath Dizziness, headache, numbness, weakness  Blood pressure 121/85, pulse 80, weight 249 lb (112.9 kg), last menstrual period 04/26/2018, SpO2 95 %.  Physical Exam  General: The patient is alert and cooperative at the time of the examination.  The patient is morbidly obese.  Skin: No significant peripheral edema is noted.   Neurologic Exam  Mental status: The patient is alert and oriented x 3 at the time of the examination. The patient has apparent normal recent and remote memory, with an apparently normal attention span and concentration ability.   Cranial nerves: Facial symmetry is present. Speech is normal, no aphasia or dysarthria is noted. Extraocular movements are full. Visual fields are full.  Motor: The patient has good strength in all 4 extremities.  Sensory examination: Soft touch sensation is symmetric on the face,  arms, and legs.  Coordination: The patient has good finger-nose-finger and heel-to-shin bilaterally.  Gait and station: The patient has a normal gait. Tandem gait is slightly unsteady. Romberg is negative but is unsteady. No drift is seen.  Reflexes: Deep tendon reflexes are symmetric.   Assessment/Plan:  1.  Multiple sclerosis  2.  Daily headache  The patient has developed daily headaches on Mayzent.  The patient does not wish to go on any medication to help prevent the headache at this time, she will  continue to take Tylenol.  The patient will have blood work done today, she will follow-up in 6 months.  Marlan Palau MD 05/24/2018 9:14 AM  [facility] [address] [place], [place] [address]  Phone [phone] Fax [phone]

## 2018-05-25 LAB — COMPREHENSIVE METABOLIC PANEL
ALT: 11 IU/L (ref 0–32)
AST: 12 IU/L (ref 0–40)
Albumin/Globulin Ratio: 1.2 (ref 1.2–2.2)
Albumin: 3.9 g/dL (ref 3.5–5.5)
Alkaline Phosphatase: 76 IU/L (ref 39–117)
BILIRUBIN TOTAL: 0.3 mg/dL (ref 0.0–1.2)
BUN / CREAT RATIO: 23 (ref 9–23)
BUN: 17 mg/dL (ref 6–24)
CALCIUM: 9.1 mg/dL (ref 8.7–10.2)
CO2: 21 mmol/L (ref 20–29)
Chloride: 105 mmol/L (ref 96–106)
Creatinine, Ser: 0.74 mg/dL (ref 0.57–1.00)
GFR, EST AFRICAN AMERICAN: 117 mL/min/{1.73_m2} (ref >59)
GFR, EST NON AFRICAN AMERICAN: 102 mL/min/{1.73_m2} (ref >59)
GLUCOSE: 93 mg/dL (ref 65–99)
Globulin, Total: 3.2 g/dL (ref 1.5–4.5)
Potassium: 3.9 mmol/L (ref 3.5–5.2)
Sodium: 141 mmol/L (ref 134–144)
TOTAL PROTEIN: 7.1 g/dL (ref 6.0–8.5)

## 2018-05-25 LAB — CBC WITH DIFFERENTIAL/PLATELET
BASOS: 0 %
Basophils Absolute: 0 10*3/uL (ref 0.0–0.2)
EOS (ABSOLUTE): 0.1 10*3/uL (ref 0.0–0.4)
EOS: 1 %
HEMATOCRIT: 33.6 % — AB (ref 34.0–46.6)
HEMOGLOBIN: 10.2 g/dL — AB (ref 11.1–15.9)
IMMATURE GRANS (ABS): 0 10*3/uL (ref 0.0–0.1)
Immature Granulocytes: 1 %
LYMPHS ABS: 0.9 10*3/uL (ref 0.7–3.1)
Lymphs: 18 %
MCH: 24.3 pg — AB (ref 26.6–33.0)
MCHC: 30.4 g/dL — ABNORMAL LOW (ref 31.5–35.7)
MCV: 80 fL (ref 79–97)
MONOCYTES: 1 %
Monocytes Absolute: 0 10*3/uL — ABNORMAL LOW (ref 0.1–0.9)
Neutrophils Absolute: 3.9 10*3/uL (ref 1.4–7.0)
Neutrophils: 79 %
Platelets: 373 10*3/uL (ref 150–450)
RBC: 4.19 x10E6/uL (ref 3.77–5.28)
RDW: 15.4 % (ref 12.3–15.4)
WBC: 4.9 10*3/uL (ref 3.4–10.8)

## 2018-05-27 ENCOUNTER — Telehealth: Payer: Self-pay | Admitting: *Deleted

## 2018-05-27 NOTE — Telephone Encounter (Signed)
Called and spoke with patient about lab results per Dr. Anne Hahn note. Pt verbalized understanding.

## 2018-05-27 NOTE — Telephone Encounter (Signed)
-----   Message from York Spaniel, MD sent at 05/25/2018 10:19 AM EDT ----- Blood work is unremarkable, with exception of a chronic stable anemia. Please call the patient. ----- Message ----- From: Nell Range Lab Results In Sent: 05/25/2018   5:39 AM EDT To: York Spaniel, MD

## 2018-06-24 ENCOUNTER — Encounter (HOSPITAL_COMMUNITY): Payer: Self-pay | Admitting: Emergency Medicine

## 2018-06-24 ENCOUNTER — Ambulatory Visit (INDEPENDENT_AMBULATORY_CARE_PROVIDER_SITE_OTHER): Payer: Medicare Other

## 2018-06-24 ENCOUNTER — Emergency Department (HOSPITAL_COMMUNITY)
Admission: EM | Admit: 2018-06-24 | Discharge: 2018-06-24 | Disposition: A | Discharge status: Home / Self Care | Payer: Medicare Other | Attending: Emergency Medicine | Admitting: Emergency Medicine

## 2018-06-24 ENCOUNTER — Ambulatory Visit (INDEPENDENT_AMBULATORY_CARE_PROVIDER_SITE_OTHER)
Admission: EM | Admit: 2018-06-24 | Discharge: 2018-06-24 | Disposition: A | Discharge status: Home / Self Care | Payer: Medicare Other | Source: Home / Self Care

## 2018-06-24 ENCOUNTER — Other Ambulatory Visit: Payer: Self-pay

## 2018-06-24 ENCOUNTER — Encounter (HOSPITAL_COMMUNITY): Payer: Self-pay

## 2018-06-24 DIAGNOSIS — S82832A Other fracture of upper and lower end of left fibula, initial encounter for closed fracture: Secondary | ICD-10-CM | POA: Insufficient documentation

## 2018-06-24 DIAGNOSIS — Z79899 Other long term (current) drug therapy: Secondary | ICD-10-CM | POA: Insufficient documentation

## 2018-06-24 DIAGNOSIS — W010XXA Fall on same level from slipping, tripping and stumbling without subsequent striking against object, initial encounter: Secondary | ICD-10-CM | POA: Diagnosis not present

## 2018-06-24 DIAGNOSIS — S82892A Other fracture of left lower leg, initial encounter for closed fracture: Secondary | ICD-10-CM

## 2018-06-24 DIAGNOSIS — Y9301 Activity, walking, marching and hiking: Secondary | ICD-10-CM | POA: Diagnosis not present

## 2018-06-24 DIAGNOSIS — Y999 Unspecified external cause status: Secondary | ICD-10-CM | POA: Insufficient documentation

## 2018-06-24 DIAGNOSIS — Y92219 Unspecified school as the place of occurrence of the external cause: Secondary | ICD-10-CM | POA: Diagnosis not present

## 2018-06-24 MED ORDER — ONDANSETRON HCL 4 MG PO TABS: 4.0000 mg | ORAL_TABLET | Freq: Four times a day (QID) | ORAL | 0 refills | Status: DC

## 2018-06-24 MED ORDER — OXYCODONE-ACETAMINOPHEN 5-325 MG PO TABS: 1.0000 | ORAL_TABLET | Freq: Four times a day (QID) | ORAL | 0 refills | Status: AC | PRN

## 2018-06-24 MED ORDER — OXYCODONE-ACETAMINOPHEN 5-325 MG PO TABS
1.0000 | ORAL_TABLET | Freq: Once | ORAL | Status: AC
  Administered 2018-06-24: 1 via ORAL
  Filled 2018-06-24: qty 1

## 2018-06-24 MED ORDER — ONDANSETRON 4 MG PO TBDP
4.0000 mg | ORAL_TABLET | Freq: Once | ORAL | Status: DC
  Filled 2018-06-24: qty 1

## 2018-06-24 NOTE — ED Notes (Signed)
Ortho has been paged and will be here in about 15 mins she said.

## 2018-06-24 NOTE — ED Triage Notes (Signed)
Pt here for fall this am with pain in left lower leg

## 2018-06-24 NOTE — ED Provider Notes (Signed)
[facility]    CSN: [id number] Arrival date & time: 06/24/18  1200     History   Chief Complaint Chief Complaint  Patient presents with  . Fall    HPI Teresa Arnold is a 42 y.o. female.   Pt here for fall this am with pain in left lower leg.  Patient was going into [employer] to visit her daughter.  The mat outside the school was wet and when she stepped inside on the linoleum floor she slipped.  This is a first time she has had any fall this year.   Patient is on medicine for multiple sclerosis.     Past Medical History:  Diagnosis Date  . Bronchitis   . Chronic insomnia 05/22/2017  . GERD (gastroesophageal reflux disease)   . Iron deficiency anemia 05/22/2017  . Morbid obesity (HCC) 05/22/2017  . MS (multiple sclerosis) [facility])     Patient Active Problem List   Diagnosis Date Noted  . Chronic insomnia 05/22/2017  . Iron deficiency anemia 05/22/2017  . Morbid obesity (HCC) 05/22/2017  . Acid reflux 12/04/2016  . 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 Multiple sclerosis (HCC) 02/13/2013    Past Surgical History:  Procedure Laterality Date  . CESAREAN SECTION     x4    OB History    Gravida  5   Para  4   Term  4   Preterm      AB  1   Living  4     SAB  1   TAB      Ectopic      Multiple      Live Births               Home Medications    Prior to Admission medications   Medication Sig Start Date End Date Taking? Authorizing Provider  acetaminophen (TYLENOL) 325 MG tablet Take 650 mg by mouth every 6 (six) hours as needed for moderate pain.     [provider]  pantoprazole (PROTONIX) 40 MG tablet TAKE 1 TABLET BY MOUTH EVERY DAY 10/15/17   Bing Neighbors, FNP  Siponimod Fumarate (MAYZENT PO) Take by mouth.    [provider]  traZODone (DESYREL) 50 MG tablet TAKE 1 TABLET (50 MG TOTAL) AT BEDTIME BY MOUTH. 11/05/17   York Spaniel, MD  valACYclovir (VALTREX) 1000 MG tablet Take 1 tablet  (1,000 mg total) by mouth daily. 08/13/17   Bing Neighbors, FNP    Family History Family History  Problem Relation Age of Onset  . Hypertension Mother   . Hypertension Other     Social History Social History   Tobacco Use  . Smoking status: Never Smoker  . Smokeless tobacco: Never Used  Substance Use Topics  . Alcohol use: Yes    Alcohol/week: 0.0 standard drinks    Comment: occasionally  . Drug use: No     Allergies   Other; Gadolinium derivatives; Sulfa antibiotics; and Tysabri [natalizumab]   Review of Systems Review of Systems  Musculoskeletal:       Lateral left lower extremity swelling and tenderness including upper fibular area and lateral malleolus  All other systems reviewed and are negative.    Physical Exam Triage Vital Signs ED Triage Vitals  Enc Vitals Group     BP 06/24/18 1258 (!) 162/93     Pulse Rate 06/24/18 1258 76     Resp 06/24/18 1258 18     Temp 06/24/18  1258 98.2 F (36.8 C)     Temp Source 06/24/18 1258 Oral     SpO2 06/24/18 1258 100 %     Weight --      Height --      Head Circumference --      Peak Flow --      Pain Score 06/24/18 1256 10     Pain Loc --      Pain Edu? --      Excl. in GC? --    No data found.  Updated Vital Signs BP (!) 162/93 (BP Location: Left Arm)   Pulse 76   Temp 98.2 F (36.8 C) (Oral)   Resp 18   SpO2 100%    Physical Exam  Constitutional: She is oriented to person, place, and time. She appears well-developed and well-nourished.  HENT:  Right Ear: External ear normal.  Left Ear: External ear normal.  Eyes: Pupils are equal, round, and reactive to light. Conjunctivae are normal.  Neck: Normal range of motion. Neck supple.  Pulmonary/Chest: Effort normal.  Musculoskeletal: She exhibits tenderness. She exhibits no deformity.  Lateral left lower extremity swelling and tenderness including upper fibular area and lateral malleolus  Neurological: She is alert and oriented to person, place,  and time.  Skin: Skin is warm and dry.  Psychiatric: She has a normal mood and affect.  Nursing note and vitals reviewed.    UC Treatments / Results  Labs (all labs ordered are listed, but only abnormal results are displayed) Labs Reviewed - No data to display  EKG None  Radiology No results found.  Procedures Procedures (including critical care time)  Medications Ordered in UC Medications - No data to display  Initial Impression / Assessment and Plan / UC Course  I have reviewed the triage vital signs and the nursing notes.  Pertinent labs & imaging results that were available during my care of the patient were reviewed by me and considered in my medical decision making (see chart for details).     Final Clinical Impressions(s) / UC Diagnoses   Final diagnoses:  Closed fracture of left ankle, initial encounter     Discharge Instructions     There is a question about whether they may be a small fracture in the knee.  You definitely have broken your ankle and this needs to be evaluated by orthopedics in the emergency room.    ED Prescriptions    None     Controlled Substance Prescriptions [place] Controlled Substance Registry consulted? Not Applicable   Elvina Sidle, MD 06/24/18 1336

## 2018-06-24 NOTE — Discharge Instructions (Addendum)
You have broken your fibula bone which is part of your ankle. I have listed an orthopedist for you to call and schedule a follow-up appointment with. From now on, you will need to keep the splint on and use crutches to walk. I have sent in pain medication for you to take. I have also included some Zofran to help if you have any nausea.  I am sorry that this happened today. I wish you a quick and speedy recovery!

## 2018-06-24 NOTE — Discharge Instructions (Signed)
There is a question about whether they may be a small fracture in the knee.  You definitely have broken your ankle and this needs to be evaluated by orthopedics in the emergency room.

## 2018-06-24 NOTE — ED Triage Notes (Signed)
Pt fell entering her child's school this AM.  Pt went to urgent care where they diagnosed her with left ankle fracture and possible left knee fracture.  Needing further imaging of left knee.

## 2018-06-24 NOTE — Progress Notes (Signed)
Orthopedic Tech Progress Note Patient Details:  Teresa Arnold 1976-05-29 [id number]  Ortho Devices Type of Ortho Device: Ace wrap, Crutches, Post (short leg) splint, Stirrup splint Ortho Device/Splint Interventions: Application   Post Interventions Patient Tolerated: Well Instructions Provided: Care of device   Saul Fordyce 06/24/2018, 5:22 PM

## 2018-06-24 NOTE — ED Notes (Signed)
ED Provider at bedside. 

## 2018-06-25 DIAGNOSIS — S82892A Other fracture of left lower leg, initial encounter for closed fracture: Secondary | ICD-10-CM | POA: Diagnosis not present

## 2018-06-25 NOTE — ED Provider Notes (Signed)
[facility] EMERGENCY DEPARTMENT Provider Note  CSN: [id number] Arrival date & time: 06/24/18  1356   History   Chief Complaint Chief Complaint  Patient presents with  . Leg Pain    HPI Teresa Arnold is a 42 y.o. female with a medical history of MS who presented to the ED for left ankle pain following a fall. Denies any preceding symptoms prior to fall such as paresthesias, weakness, foot drop or syncope. She states that she slipped on a wet mat while walking into a school. Patient previously evaluated at urgent care where imaging was performed on left ankle and tib-fib.  Leg Pain   This is a new problem. The current episode started 1 to 2 hours ago (Patient reports mechanical fall and landing on lateral aspect of left leg. Has not been ambulatory since the accident.). The problem occurs constantly. The problem has not changed since onset.The pain is present in the left ankle and left lower leg. The quality of the pain is described as aching and constant. The pain is at a severity of 10/10. The pain is severe. Associated symptoms include limited range of motion. Pertinent negatives include no numbness, no stiffness and no tingling. The symptoms are aggravated by standing. She has tried nothing (Patient presented for medical evaluation immediately after the fall) for the symptoms. There has been a history of trauma.   Past Medical History:  Diagnosis Date  . Bronchitis   . Chronic insomnia 05/22/2017  . GERD (gastroesophageal reflux disease)   . Iron deficiency anemia 05/22/2017  . Morbid obesity (HCC) 05/22/2017  . MS (multiple sclerosis) [facility])     Patient Active Problem List   Diagnosis Date Noted  . Chronic insomnia 05/22/2017  . Iron deficiency anemia 05/22/2017  . Morbid obesity (HCC) 05/22/2017  . Acid reflux 12/04/2016  . 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 Multiple sclerosis (HCC) 02/13/2013    Past Surgical History:  Procedure Laterality Date  .  CESAREAN SECTION     x4     OB History    Gravida  5   Para  4   Term  4   Preterm      AB  1   Living  4     SAB  1   TAB      Ectopic      Multiple      Live Births               Home Medications    Prior to Admission medications   Medication Sig Start Date End Date Taking? Authorizing Provider  acetaminophen (TYLENOL) 325 MG tablet Take 650 mg by mouth every 6 (six) hours as needed for moderate pain.     [provider]  ondansetron (ZOFRAN) 4 MG tablet Take 1 tablet (4 mg total) by mouth every 6 (six) hours. 06/24/18   Azula Zappia, Jerrel Ivory I, PA-C  oxyCODONE-acetaminophen (PERCOCET/ROXICET) 5-325 MG tablet Take 1 tablet by mouth every 6 (six) hours as needed for up to 5 days for severe pain. 06/24/18 06/29/18  Sherri Levenhagen, Jerrel Ivory I, PA-C  pantoprazole (PROTONIX) 40 MG tablet TAKE 1 TABLET BY MOUTH EVERY DAY 10/15/17   Bing Neighbors, FNP  Siponimod Fumarate (MAYZENT PO) Take by mouth.    [provider]  traZODone (DESYREL) 50 MG tablet TAKE 1 TABLET (50 MG TOTAL) AT BEDTIME BY MOUTH. 11/05/17   York Spaniel, MD  valACYclovir (VALTREX) 1000 MG tablet Take 1 tablet (1,000 mg total)  by mouth daily. 08/13/17   Bing Neighbors, FNP    Family History Family History  Problem Relation Age of Onset  . Hypertension Mother   . Hypertension Other     Social History Social History   Tobacco Use  . Smoking status: Never Smoker  . Smokeless tobacco: Never Used  Substance Use Topics  . Alcohol use: Yes    Alcohol/week: 0.0 standard drinks    Comment: occasionally  . Drug use: No     Allergies   Other; Gadolinium derivatives; Sulfa antibiotics; and Tysabri [natalizumab]   Review of Systems Review of Systems  Constitutional: Negative.   Musculoskeletal: Positive for arthralgias, gait problem and joint swelling. Negative for stiffness.  Skin: Negative for color change and wound.  Neurological: Negative for tingling, weakness and  numbness.  Hematological: Negative.      Physical Exam Updated Vital Signs Ht 5\' 4"  (1.626 m)   Wt 110.7 kg   LMP 06/23/2018 (Exact Date)   BMI 41.88 kg/m   Physical Exam  Constitutional: She appears well-developed and well-nourished.  Sitting in wheelchair. Grimacing in pain.  Cardiovascular:  Pulses:      Dorsalis pedis pulses are 2+ on the right side, and 2+ on the left side.       Posterior tibial pulses are 2+ on the right side, and 2+ on the left side.  Musculoskeletal:       Left knee: Normal. She exhibits normal range of motion, no swelling, normal alignment, normal patellar mobility, no bony tenderness and normal meniscus. No tenderness found.       Left ankle: She exhibits decreased range of motion and swelling. She exhibits no deformity. Tenderness. Lateral malleolus tenderness found. Achilles tendon normal.       Left lower leg: She exhibits tenderness, bony tenderness and swelling. She exhibits no deformity.       Right foot: There is normal range of motion and no deformity.       Left foot: There is decreased range of motion. There is no deformity.  Left Knee: Full ROM with 5/5 strength. No swelling, deformity or tenderness. No ligamental laxity. Lateral aspect of lower calf and ankle has bony tenderness.  Unable to bear weight or ambulate due to ankle pain.  Lower leg compartments are soft.   Neurological: She displays no atrophy. No sensory deficit. She exhibits normal muscle tone.  Reflex Scores:      Patellar reflexes are 2+ on the right side and 2+ on the left side.      Achilles reflexes are 2+ on the right side and 2+ on the left side. Skin: Skin is warm and intact. Capillary refill takes less than 2 seconds. No bruising and no ecchymosis noted.  Nursing note and vitals reviewed.    ED Treatments / Results  Labs (all labs ordered are listed, but only abnormal results are displayed) Labs Reviewed - No data to display  EKG None  Radiology Dg  Tibia/fibula Left  Result Date: 06/24/2018 CLINICAL DATA:  Post fall this morning taking daughter to school, now with ankle pain and swelling. EXAM: LEFT TIBIA AND FIBULA - 2 VIEW COMPARISON:  Left ankle radiographs-earlier same day. FINDINGS: There is a minimally displaced obliquely oriented fracture involving the distal fibula with extension to involve the distal tib-fib joint, better demonstrated on dedicated ankle radiographs performed earlier same day. No additional fractures identified. Expected adjacent soft tissue swelling. Limited visualization of the knee demonstrates enthesopathic change involving the medial femoral  condyle (Pellegrini-Stieda). Dermal calcifications overlie the anterior aspect of the mid shin. No radiopaque foreign body. IMPRESSION: Acute minimally displaced fracture of the distal fibula, better demonstrated on dedicated ankle radiographs performed earlier same day Electronically Signed   By: Simonne Come M.D.   On: 06/24/2018 13:41   Dg Ankle Complete Left  Result Date: 06/24/2018 CLINICAL DATA:  Patient fell taking daughter to school earlier today. EXAM: LEFT ANKLE COMPLETE - 3+ VIEW COMPARISON:  Left tibia and fibular radiographs-earlier same day FINDINGS: There is an acute, minimally displaced obliquely oriented fracture of the distal fibula with extension to the distal tib-fib joint. Ankle mortise is preserved. Tiny ossicle adjacent to the talar beak likely represents sequela of prior/remote avulsive injury. Mild degenerative change of several midfoot articulations. Tiny plantar calcaneal spur. Suspected soft tissue swelling about the anterolateral aspect of the ankle. No radiopaque foreign body. IMPRESSION: Acute, minimally displaced fracture of the distal fibula with extension to the distal tib-fib joint. Electronically Signed   By: Simonne Come M.D.   On: 06/24/2018 13:43    Procedures Procedures (including critical care time)  Medications Ordered in ED Medications    oxyCODONE-acetaminophen (PERCOCET/ROXICET) 5-325 MG per tablet 1 tablet (1 tablet Oral Given 06/24/18 1452)     Initial Impression / Assessment and Plan / ED Course  Triage vital signs and the nursing notes have been reviewed.  Pertinent labs & imaging results that were available during care of the patient were reviewed and considered in medical decision making (see chart for details).  Patient presents from urgent care after a mechanical fall where she inverted her ankle. Patient received ankle and tib-fib x-rays at urgent care, but was sent to the ED because the urgent care provider had concerns about a knee fracture in addition to a fractured fibula. However, patient is not endorsing any knee pain. On physical exam, the knee is normal. She has full ROM and strength. There is no ligament laxity, bony tenderness or patellar issues that would lead [place] to do knee imaging today. Left leg compartments are soft and patient's neurovascular function is intact. Patient did not receive treatment for fibula fracture at the urgent care and will continue that treatment here with splint application and ortho referral.    Final Clinical Impressions(s) / ED Diagnoses  1. Left Distal Fibula Fracture. Splint applied in the ED and given crutches. Instructed to be non-weight bearing until evaluated by orthopedics. Rx for Percocet given for pain. No current Rxs in the [place] Controlled Substance Database. Education provided on splint care. Referral to ortho given.  Dispo: Home. After thorough clinical evaluation, this patient is determined to be medically stable and can be safely discharged with the previously mentioned treatment and/or outpatient follow-up/referral(s). At this time, there are no other apparent medical conditions that require further screening, evaluation or treatment.   Final diagnoses:  Closed fracture of distal end of left fibula, unspecified fracture morphology, initial encounter    ED Discharge  Orders         Ordered    oxyCODONE-acetaminophen (PERCOCET/ROXICET) 5-325 MG tablet  Every 6 hours PRN     06/24/18 1623    ondansetron (ZOFRAN) 4 MG tablet  Every 6 hours     06/24/18 1623            Jadore Veals, [place] I, PA-C 06/25/18 1001    Sabas Sous, MD 06/25/18 1524

## 2018-07-02 DIAGNOSIS — S82892D Other fracture of left lower leg, subsequent encounter for closed fracture with routine healing: Secondary | ICD-10-CM | POA: Diagnosis not present

## 2018-07-03 DIAGNOSIS — G8918 Other acute postprocedural pain: Secondary | ICD-10-CM | POA: Diagnosis not present

## 2018-07-03 DIAGNOSIS — Y999 Unspecified external cause status: Secondary | ICD-10-CM | POA: Diagnosis not present

## 2018-07-03 DIAGNOSIS — S8262XA Displaced fracture of lateral malleolus of left fibula, initial encounter for closed fracture: Secondary | ICD-10-CM | POA: Diagnosis not present

## 2018-07-03 DIAGNOSIS — X58XXXA Exposure to other specified factors, initial encounter: Secondary | ICD-10-CM | POA: Diagnosis not present

## 2018-07-16 DIAGNOSIS — S8262XD Displaced fracture of lateral malleolus of left fibula, subsequent encounter for closed fracture with routine healing: Secondary | ICD-10-CM | POA: Diagnosis not present

## 2018-07-21 ENCOUNTER — Other Ambulatory Visit: Payer: Self-pay | Admitting: Family Medicine

## 2018-07-30 DIAGNOSIS — S8262XD Displaced fracture of lateral malleolus of left fibula, subsequent encounter for closed fracture with routine healing: Secondary | ICD-10-CM | POA: Diagnosis not present

## 2018-08-13 ENCOUNTER — Ambulatory Visit: Payer: Medicare Other | Attending: Orthopaedic Surgery | Admitting: Physical Therapy

## 2018-08-13 ENCOUNTER — Other Ambulatory Visit: Payer: Self-pay

## 2018-08-13 ENCOUNTER — Encounter: Payer: Self-pay | Admitting: Physical Therapy

## 2018-08-13 DIAGNOSIS — R262 Difficulty in walking, not elsewhere classified: Secondary | ICD-10-CM | POA: Diagnosis not present

## 2018-08-13 DIAGNOSIS — M25675 Stiffness of left foot, not elsewhere classified: Secondary | ICD-10-CM | POA: Insufficient documentation

## 2018-08-13 DIAGNOSIS — M25572 Pain in left ankle and joints of left foot: Secondary | ICD-10-CM | POA: Insufficient documentation

## 2018-08-13 DIAGNOSIS — M6281 Muscle weakness (generalized): Secondary | ICD-10-CM | POA: Diagnosis not present

## 2018-08-13 NOTE — Therapy (Addendum)
[facility] [address] [place], [place], [address] Phone: [phone]   Fax:  [phone]  Physical Therapy Evaluation  Patient Details  Name: Teresa Arnold MRN: [id number] Date of Birth: [birth year] Referring Provider (PT): Ramond Marrow MD   Encounter Date: 08/13/2018  PT End of Session - 08/13/18 1510    Visit Number  1    Number of Visits  16    Date for PT Re-Evaluation  10/08/18    Authorization Type  Medicare ; progress note @ 10 KX modifier @ 15    PT Start Time  1332    PT Stop Time  1416    PT Time Calculation (min)  44 min    Activity Tolerance  Patient tolerated treatment well    Behavior During Therapy  [facility] for tasks assessed/performed       Past Medical History:  Diagnosis Date  . Bronchitis   . Chronic insomnia 05/22/2017  . GERD (gastroesophageal reflux disease)   . Iron deficiency anemia 05/22/2017  . Morbid obesity (HCC) 05/22/2017  . MS (multiple sclerosis) (HCC)     Past Surgical History:  Procedure Laterality Date  . CESAREAN SECTION     x4    There were no vitals filed for this visit.   Subjective Assessment - 08/13/18 1335    Subjective  Pt had fall on 10/14 resulting in distal fibular fracture on LLE. Pt had ORIF on L fibula 07/03/18 per pt. Went to utilizing 1 crutch 2 days ago and has began to practice ambulating in home without wearin boot. Pt also reporting medial knee pain that is more limiting when walking than ankle is, which was also result of fall.  Pt reports numbness and tingling in plantar surface of L foot.     Patient is accompained by:  --   accompanied by a female but did not ask relationship   Pertinent History  MS, HSV-2 infection, morbid obesity     How long can you sit comfortably?  if took medication prior able to sit without increased ankle pain     How long can you stand comfortably?  10 minutes before pain in medial L knee increases     How long can you walk  comfortably?  <5 min    Diagnostic tests  Acute minimally displaced fracture of the distal L fibula     Patient Stated Goals  improve ankle ROM and strength in order to walk without boot     Currently in Pain?  Yes    Pain Score  3    with increased activties   Pain Location  Ankle    Pain Orientation  Left    Pain Descriptors / Indicators  Aching;Throbbing    Pain Type  Acute pain;Surgical pain    Pain Radiating Towards  Denies radicular pain     Pain Onset  More than a month ago    Pain Frequency  Intermittent    Aggravating Factors   increased weight bearing when not wearing boot     Pain Relieving Factors  pain medications     Multiple Pain Sites  Yes    Pain Score  5    Pain Location  Knee    Pain Orientation  Right    Pain Type  Acute pain    Pain Radiating Towards  denies     Pain Onset  More than a month ago    Aggravating Factors   standing, walking  Pain Relieving Factors  gel pack     Effect of Pain on Daily Activities  decreased mobility          [facility] PT Assessment - 08/13/18 0001      Assessment   Medical Diagnosis  L distial fibula ORIF    Referring Provider (PT)  Ramond Marrow MD    Onset Date/Surgical Date  07/03/18    Next MD Visit  Next week    Prior Therapy  Previous therapy in 2013      Precautions   Precaution Comments  per pt MD instructed pt not to bear weight on LLE until being seen in therapy but began walking on LLE the day before thanksgiving, and ambulating with boot doffed    Required Braces or Orthoses  --   Pt wearing L boot at time of session     Restrictions   Weight Bearing Restrictions  Yes    LLE Weight Bearing  Partial weight bearing    LLE Partial Weight Bearing Percentage or Pounds  progressive weight bearing 30% -3 weeks  See media for prescription      Balance Screen   Has the patient fallen in the past 6 months  Yes    How many times?  1 which resulted in fracture    Has the patient had a decrease in activity level because  of a fear of falling?   Yes    Is the patient reluctant to leave their home because of a fear of falling?   No      Home Environment   Living Environment  Private residence    Home Equipment  Crutches   knee scooter   Additional Comments  no stairs to enter house   day before thanksgiving using knee scooter- less since stopp     Prior Function   Level of Independence  Independent with household mobility with device;Independent with community mobility with device;Independent with household mobility without device      Cognition   Overall Cognitive Status  Within Functional Limits for tasks assessed    Memory  Appears intact    Awareness  Appears intact    Problem Solving  Appears intact      Observation/Other Assessments-Edema    Edema  Figure 8   54 cm L; 52 cm R     Sensation   Additional Comments  numbness and tingling in plantar surface of left foot      ROM / Strength   AROM / PROM / Strength  AROM;PROM;Strength      AROM   AROM Assessment Site  Knee;Ankle    Right/Left Knee  Right;Left    Right Knee Flexion  68   increase pain medially   Left Knee Flexion  110    Right/Left Ankle  Right;Left    Right Ankle Dorsiflexion  8    Right Ankle Plantar Flexion  18    Right Ankle Inversion  --   neutral   Right Ankle Eversion  --   neutral   Left Ankle Dorsiflexion  15    Left Ankle Plantar Flexion  35    Left Ankle Inversion  15    Left Ankle Eversion  30      PROM   PROM Assessment Site  Knee;Ankle      Strength   Strength Assessment Site  Knee;Ankle;Hip    Right/Left Hip  Right;Left    Right Hip Flexion  3+/5    Right Hip ABduction  4-/5    Right Hip ADduction  4-/5    Left Hip Flexion  3+/5    Left Hip ABduction  3+/5    Left Hip ADduction  3+/5    Right/Left Knee  Right;Left    Right Knee Flexion  4/5    Right Knee Extension  4/5    Left Knee Flexion  3+/5    Left Knee Extension  4-/5    Right/Left Ankle  Right;Left    Right Ankle Dorsiflexion  3/5     Right Ankle Plantar Flexion  3/5    Right Ankle Inversion  --   defered at eval   Right Ankle Eversion  --   defered at eval   Left Ankle Dorsiflexion  4+/5    Left Ankle Plantar Flexion  4+/5    Left Ankle Inversion  4+/5    Left Ankle Eversion  4+/5      Ambulation/Gait   Gait Comments  ambulating in boot on LLE with 1 crutch, decreased stance time on RLE, decreased hip and knee flexion in swing phase, unsteady gait with crtuch                 Objective measurements completed on examination: See above findings.      [facility] Adult PT Treatment/Exercise - 08/13/18 0001      Self-Care   Self-Care  RICE;Other Self-Care Comments    RICE  for edema    Other Self-Care Comments   weight bearing precautions, wearing boot when ambulating,       Ankle Exercises: Stretches   Gastroc Stretch  2 reps;30 seconds    Gastroc Stretch Limitations  seated with strap with cues for placement to imrpove stretch      Ankle Exercises: Supine   Other Supine Ankle Exercises  quad sets x10 5 sec holds; SLR x5 with cues for DF    Other Supine Ankle Exercises  ankle pumps x10; eversion/inversion L ankle ROM              PT Education - 08/13/18 1508    Education Details  HEP, bone healing, ambulating in boot, symptom manamgent with RICE; % weight bearing with scale     Person(s) Educated  Patient;Other (comment)   did not ask relationship   Methods  Explanation;Demonstration;Tactile cues;Verbal cues;Handout    Comprehension  Verbalized understanding;Returned demonstration;Verbal cues required;Tactile cues required       PT Short Term Goals - 08/13/18 1533      PT SHORT TERM GOAL #1   Title  Pt will improve L active ankle dorsiflexion by 5 deg    Baseline  8 deg    Time  4    Period  Weeks    Status  New    Target Date  09/10/18      PT SHORT TERM GOAL #2   Title  Pt will decrease L ankle edema by 1 cm     Baseline  54cm    Time  4    Period  Weeks    Status  New     Target Date  09/10/18      PT SHORT TERM GOAL #3   Title  Pt will demonstrate 3+/5 L ankle dorsiflexors     Baseline  deferred at eval secondary to pain     Time  4    Period  Weeks    Status  New    Target Date  09/10/18  PT Long Term Goals - 08/13/18 1541      PT LONG TERM GOAL #1   Title  Pt will improve L ankle active DF ROM by 8 deg and PF by 10 deg in order to demonstrate heel strike/ toe off with LRAD on level surfaces     Baseline  active DF 8; active PF 18 deg    Time  8    Period  Weeks    Status  New    Target Date  10/08/18      PT LONG TERM GOAL #2   Title  Pt will improve gross L ankle strength to 4/5 in order to perform 10sec SLS    Baseline  3/5 gross strength; unable to perform SLS at eval     Time  8    Period  Weeks    Status  New    Target Date  10/08/18      PT LONG TERM GOAL #3   Title  Pt will ambulate 500' with LRAD without any increased ankle pain in order to complete ADLs    Baseline  ambulating in boot with crutch at time of session, 2 standing rest breaks required from lobby to first treatment table     Time  8    Period  Weeks    Status  New    Target Date  10/08/18             Plan - 08/13/18 1511    Clinical Impression Statement  Pt is a 42 y.o. female 6 weeks s/p ORIF presenting with decreased L ankle ROM and strength as expected. Pt also reporting medial R knee pain from fall which worsened intially as she was utilzing knee scooter. Transitioned onto crutches during week of Thanksgiving, which decreased knee pain some but pt reports pain in knee is worse than ankle. Pt reports she was instructed to remain in boot and NWB until PT but due to delay in getting schedueled, she has began bearing weight on ankle at home with boot doffed. Pt shown weight bearing precautions on scale and instructed to remain in boot for ankle stability when ambulating at home and in community. Encouraged pt to call MD if knee pain worsens. Pt will benefit  from skilled PT in order to improve ROM, strength, and overall mobility and function.     History and Personal Factors relevant to plan of care:  multiple sclerosis, morbid obesity     Clinical Presentation  Evolving    Clinical Presentation due to:  fall which resulted in L fibular ORIF effecting mobility     Clinical Decision Making  Low    Rehab Potential  Good    PT Frequency  2x / week    PT Duration  8 weeks    PT Treatment/Interventions  ADLs/Self Care Home Management;Cryotherapy;Electrical Stimulation;Iontophoresis 4mg /ml Dexamethasone;Moist Heat;Ultrasound;DME Instruction;Gait training;Stair training;Functional mobility training;Therapeutic activities;Therapeutic exercise;Balance training;Neuromuscular re-education;Patient/family education;Manual techniques;Scar mobilization;Passive range of motion;Dry needling;Taping;Vasopneumatic Device    PT Next Visit Plan  PROM on ankle, retrograde massage, scar mobilization, ankle isometrics, towel srunches, IASTM to calf muscle if needed, calf stretch, ankle pumps, BAPS board, knee and hip strengthening     PT Home Exercise Plan  ankle AROM, quad sets, SLR, calf stretch seated     Consulted and Agree with Plan of Care  Patient       Patient will benefit from skilled therapeutic intervention in order to improve the following deficits and impairments:  Abnormal gait,  Pain, Postural dysfunction, Decreased activity tolerance, Decreased endurance, Decreased range of motion, Decreased strength, Decreased knowledge of precautions, Difficulty walking, Obesity  Visit Diagnosis: Pain in left ankle and joints of left foot  Stiffness of left foot, not elsewhere classified  Difficulty in walking, not elsewhere classified  Muscle weakness (generalized)     Problem List Patient Active Problem List   Diagnosis Date Noted  . Chronic insomnia 05/22/2017  . Iron deficiency anemia 05/22/2017  . Morbid obesity (HCC) 05/22/2017  . Acid reflux 12/04/2016   . 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 Multiple sclerosis (HCC) 02/13/2013    Dessie Coma SPT 08/13/2018, 4:35 PM   Donzetta Kohut SPT  08/13/2018   During this treatment session, the therapist was present, participating in and directing the treatment.   [facility] [address] [place], [place], [address] Phone: [phone]   Fax:  [phone]  Name: Teresa Arnold MRN: [id number] Date of Birth: 1976-03-26

## 2018-08-13 NOTE — Patient Instructions (Signed)
Seated Ankle Pumps reps: 10 sets: 3 daily: 3 weekly: 7   Exercise image step 1   Exercise image step 2  Setup  Begin sitting upright with one leg straight forward. Movement  Slowly pump your ankle, bending your foot up toward your body, then pointing your toes away from your body, and repeat. Tip  Make sure to move your foot in a straight line and try to keep the rest of your leg relaxed. Long Sitting Calf Stretch with Strap reps: 10 sets: 3 daily: 3 weekly: 7   Exercise image step 1   Exercise image step 2  Setup  Begin sitting on the floor with one foot stretched in front of you, your other knee bent, and a strap secured around your foot. Movement  Slowly pull your foot towards you with the strap until you feel a stretch in your calf. Tip  Make sure to keep your knee straight during the stretch. Disclaimer: This program provides exercises related to your condition that you can perform at home. As there is a risk of injury with any activity, use caution when performing exercises. If you experience any pain or discomfort, discontinue the exercises and contact your health care provider.  Login URL: [email or web address] . Access Code: [email or web address] . Date printed: 08/13/2018 Page 2  Supine Ankle Inversion and Eversion AROM reps: 10 sets: 3 daily: 3 weekly: 7   Exercise image step 1   Exercise image step 2  Setup  Begin lying on your back with one leg bent and your other leg straight. Movement  Rotate the foot of your straight leg inward, then outward, and repeat. Tip  Make sure to keep the rest of your leg relaxed and focus the movement on your ankle during the exercise. Supine Quad Set reps: 10 sets: 3 daily: 3 weekly: 7   Exercise image step 1   Exercise image step 2  Setup  Begin lying on your back with one knee bent and your other leg straight with your knee resting on a towel roll. Movement  Gently squeeze your thigh muscles, pushing the back of your knee down into  the towel. Tip  Make sure to keep your back flat against the floor during the exercise. Disclaimer: This program provides exercises related to your condition that you can perform at home. As there is a risk of injury with any activity, use caution when performing exercises. If you experience any pain or discomfort, discontinue the exercises and contact your health care provider.  Login URL: [email or web address] . Access Code: [email or web address] . Date printed: 08/13/2018 Page 3  Straight Leg Raise reps: 10 sets: 3 daily: 2 weekly: 7   Exercise image step 1   Exercise image step 2  Setup  Begin lying on your back with one leg bent and your opposite leg straight. Movement  Keeping your leg straight, raise your leg up until your thigh is at the same height of your bent knee. Slowly return to the starting position and repeat. Tip  Make sure to not let your leg or pelvis rotate to either side and do not arch your back.

## 2018-08-19 ENCOUNTER — Ambulatory Visit: Payer: Medicare Other | Admitting: Physical Therapy

## 2018-08-19 DIAGNOSIS — R262 Difficulty in walking, not elsewhere classified: Secondary | ICD-10-CM

## 2018-08-19 DIAGNOSIS — M25572 Pain in left ankle and joints of left foot: Secondary | ICD-10-CM

## 2018-08-19 DIAGNOSIS — M6281 Muscle weakness (generalized): Secondary | ICD-10-CM | POA: Diagnosis not present

## 2018-08-19 DIAGNOSIS — M25675 Stiffness of left foot, not elsewhere classified: Secondary | ICD-10-CM

## 2018-08-19 NOTE — Therapy (Signed)
[facility] [address] [place], [place], [address] Phone: [phone]   Fax:  [phone]  Physical Therapy Treatment/MD progress note  Patient Details  Name: Teresa Arnold MRN: [id number] Date of Birth: 1975-10-11 Referring Provider (PT): Ramond Marrow MD   Encounter Date: 08/19/2018  PT End of Session - 08/19/18 1242    Visit Number  2    Number of Visits  16    Date for PT Re-Evaluation  10/08/18    Authorization Type  Medicare ; progress note @ 10 KX modifier @ 15    PT Start Time  1157   pt 12 min late   PT Stop Time  1230    PT Time Calculation (min)  33 min    Activity Tolerance  Patient tolerated treatment well    Behavior During Therapy  [facility] for tasks assessed/performed       Past Medical History:  Diagnosis Date  . Bronchitis   . Chronic insomnia 05/22/2017  . GERD (gastroesophageal reflux disease)   . Iron deficiency anemia 05/22/2017  . Morbid obesity (HCC) 05/22/2017  . MS (multiple sclerosis) (HCC)     Past Surgical History:  Procedure Laterality Date  . CESAREAN SECTION     x4    There were no vitals filed for this visit.  Subjective Assessment - 08/19/18 1238    Subjective  Pt relays she has been walking with boot and one crutch and has progressed to 60% weight bearing.    Pertinent History  MS, HSV-2 infection, morbid obesity     Currently in Pain?  Yes    Pain Score  3     Pain Location  Ankle    Pain Orientation  Left    Pain Descriptors / Indicators  Aching;Throbbing    Pain Type  Surgical pain    Pain Radiating Towards  reports some tingling in lateral foot and toes    Pain Onset  More than a month ago    Pain Frequency  Intermittent         [facility] PT Assessment - 08/19/18 0001      Assessment   Medical Diagnosis  L distial fibular ORIF    Referring Provider (PT)  Ramond Marrow MD    Onset Date/Surgical Date  07/03/18    Next MD Visit  08/20/18      Restrictions   Weight Bearing  Restrictions  Yes    LLE Weight Bearing  Partial weight bearing    LLE Partial Weight Bearing Percentage or Pounds  progressive weight bearing 30% 3 weeks      ROM / Strength   AROM / PROM / Strength  AROM;Strength      AROM   Left Ankle Dorsiflexion  5    Left Ankle Plantar Flexion  40    Left Ankle Inversion  20    Left Ankle Eversion  10      Strength   Left Ankle Dorsiflexion  3+/5    Left Ankle Plantar Flexion  3+/5    Left Ankle Inversion  3+/5    Left Ankle Eversion  3+/5      Ambulation/Gait   Gait Comments  ambulated in parallell bars no boot PWB 60% up/back one time step to pattern, next time step through pattern                   [facility] Adult PT Treatment/Exercise - 08/19/18 0001      Exercises  Exercises  Ankle      Manual Therapy   Manual therapy comments  PROM all planes to tolerance      Ankle Exercises: Stretches   Soleus Stretch  2 reps;30 seconds    Soleus Stretch Limitations  seated with strap    Gastroc Stretch  2 reps;30 seconds    Gastroc Stretch Limitations  seated with strap with cues for placement to imrpove stretch      Ankle Exercises: Aerobic   Nustep  5 min L1 in boot      Ankle Exercises: Seated   Heel Raises  Both;20 reps    Toe Raise  20 reps      Ankle Exercises: Supine   Other Supine Ankle Exercises  ankle circles X 20                PT Short Term Goals - 08/19/18 1245      PT SHORT TERM GOAL #1   Title  Pt will improve dorsiflexion by 5 deg    Baseline  8 deg    Time  4    Period  Weeks    Status  On-going      PT SHORT TERM GOAL #2   Title  Pt will decrease L ankle edema by 1 cm     Baseline  54cm    Time  4    Period  Weeks    Status  On-going      PT SHORT TERM GOAL #3   Title  Pt will demonstrate 3+/5 ankle dorsiflexors     Period  Weeks    Status  Achieved        PT Long Term Goals - 08/19/18 1245      PT LONG TERM GOAL #1   Title  Pt will improve L ankle active DF ROM by 8 deg and  PF by 10 deg in order to demonstrate heel strike/ toe off with LRAD on level surfaces     Baseline  active DF 8; active PF 18 deg    Period  Weeks    Status  On-going      PT LONG TERM GOAL #2   Title  Pt will improve gross L ankle strength to 4/5 in order to perform 10sec SLS    Period  Weeks    Status  On-going      PT LONG TERM GOAL #3   Title  Pt will ambulate 500' with LRAD without any increased ankle pain in order to complete ADLs    Time  8    Period  Weeks    Status  On-going            Plan - 08/19/18 1242    Clinical Impression Statement  Pt will see MD tommorow for F/U but she has been progressing her PWB status to about 60%. This was practiced today in parrallel bars with step to pattern to begin but then she was able to progress to step though pattern. Session today also focused on ROM with stretching, AROM and PROM, see updated measurements in this area. She will continue to benefit from PT to safely progress weight bearing, strength, ROM, and return to function.     Rehab Potential  Good    PT Frequency  2x / week    PT Duration  8 weeks    PT Treatment/Interventions  ADLs/Self Care Home Management;Cryotherapy;Electrical Stimulation;Iontophoresis 4mg /ml Dexamethasone;Moist Heat;Ultrasound;DME Instruction;Gait training;Stair training;Functional mobility training;Therapeutic activities;Therapeutic exercise;Balance training;Neuromuscular re-education;Patient/family  education;Manual techniques;Scar mobilization;Passive range of motion;Dry needling;Taping;Vasopneumatic Device    PT Next Visit Plan  PROM on ankle, retrograde massage, scar mobilization, ankle isometrics, towel srunches, IASTM to calf muscle if needed, calf stretch, ankle pumps, BAPS board, knee and hip strengthening may need desenisitization at some point.     PT Home Exercise Plan  ankle AROM, quad sets, SLR, calf stretch seated;     Consulted and Agree with Plan of Care  Patient       Patient will benefit  from skilled therapeutic intervention in order to improve the following deficits and impairments:  Abnormal gait, Pain, Postural dysfunction, Decreased activity tolerance, Decreased endurance, Decreased range of motion, Decreased strength, Decreased knowledge of precautions, Difficulty walking, Obesity  Visit Diagnosis: Pain in left ankle and joints of left foot  Stiffness of left foot, not elsewhere classified  Difficulty in walking, not elsewhere classified  Muscle weakness (generalized)     Problem List Patient Active Problem List   Diagnosis Date Noted  . Chronic insomnia 05/22/2017  . Iron deficiency anemia 05/22/2017  . Morbid obesity (HCC) 05/22/2017  . Acid reflux 12/04/2016  . 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 Multiple sclerosis (HCC) 02/13/2013    April Manson , PT,DPT 08/19/2018, 12:48 PM  [facility] [address] [place], [place], [address] Phone: [phone]   Fax:  [phone]  Name: Teresa Arnold MRN: [id number] Date of Birth: 22-Dec-1975

## 2018-08-20 DIAGNOSIS — S8262XD Displaced fracture of lateral malleolus of left fibula, subsequent encounter for closed fracture with routine healing: Secondary | ICD-10-CM | POA: Diagnosis not present

## 2018-08-23 ENCOUNTER — Encounter

## 2018-08-26 ENCOUNTER — Encounter: Payer: Self-pay | Admitting: Physical Therapy

## 2018-08-26 ENCOUNTER — Ambulatory Visit: Payer: Medicare Other | Admitting: Physical Therapy

## 2018-08-26 DIAGNOSIS — M25572 Pain in left ankle and joints of left foot: Secondary | ICD-10-CM

## 2018-08-26 DIAGNOSIS — R262 Difficulty in walking, not elsewhere classified: Secondary | ICD-10-CM | POA: Diagnosis not present

## 2018-08-26 DIAGNOSIS — M6281 Muscle weakness (generalized): Secondary | ICD-10-CM | POA: Diagnosis not present

## 2018-08-26 DIAGNOSIS — M25675 Stiffness of left foot, not elsewhere classified: Secondary | ICD-10-CM

## 2018-08-26 NOTE — Patient Instructions (Signed)
Issued from exercise drawer  AROM ankle All issued PRN several times a day 0 to 5 seconds hold

## 2018-08-26 NOTE — Therapy (Signed)
[facility] Rehabilitation [facility] [address] [place], [place], [address] Phone: [phone]   Fax:  [phone]  Physical Therapy Treatment  Patient Details  Name: Teresa Arnold MRN: [id number] Date of Birth: Oct 19, 1975 Referring Provider (PT): Ramond Marrow MD   Encounter Date: 08/26/2018  PT End of Session - 08/26/18 1032    Visit Number  3    Number of Visits  16    Date for PT Re-Evaluation  10/08/18    Authorization Type  Medicare ; progress note @ 10 KX modifier @ 15    PT Start Time  0935    PT Stop Time  1015    PT Time Calculation (min)  40 min    Activity Tolerance  Patient tolerated treatment well    Behavior During Therapy  [facility] for tasks assessed/performed       Past Medical History:  Diagnosis Date  . Bronchitis   . Chronic insomnia 05/22/2017  . GERD (gastroesophageal reflux disease)   . Iron deficiency anemia 05/22/2017  . Morbid obesity (HCC) 05/22/2017  . MS (multiple sclerosis) (HCC)     Past Surgical History:  Procedure Laterality Date  . CESAREAN SECTION     x4    There were no vitals filed for this visit.  Subjective Assessment - 08/26/18 0939    Subjective  WBAT  .  Saw  MD since last week  OK to D/C boot.  Wean from crutches as able.   Pain is the same with the crutch or without the crutch.   Has been doing the exercises.  tingling on bottom of foot gone.    Currently in Pain?  Yes    Pain Score  5     Pain Location  Ankle    Pain Orientation  Left    Pain Descriptors / Indicators  Aching;Throbbing    Pain Radiating Towards  tingling on scar only    Aggravating Factors   longer standing,  walking    Pain Relieving Factors  sitting,, up,,  exercise    Multiple Pain Sites  No    Pain Location  Knee    Pain Relieving Factors  getting rid of boot.                        [facility] Adult PT Treatment/Exercise - 08/26/18 0001      Manual Therapy   Manual therapy comments  PROM,  gentle scar  tissue retrograde tissue softened more mobile.  encouraged patient to do at home.        Ankle Exercises: Stretches   Other Stretch  seated  heel lift with toes on floor for stretch 10 X    Other Stretch  rocker pro stretch 3 minutes      Ankle Exercises: Seated   Toe Raise  20 reps    Heel Slides  15 reps;Left      Ankle Exercises: Supine   Other Supine Ankle Exercises  circles,  inversion,  eversion ,  abc's  for HEP               PT Education - 08/26/18 0955    Education Details  anatomy. HEP,  manual techniques    Person(s) Educated  Patient    Methods  Explanation;Demonstration    Comprehension  Verbalized understanding       PT Short Term Goals - 08/19/18 1245      PT SHORT TERM GOAL #1  Title  Pt will improve dorsiflexion by 5 deg    Baseline  8 deg    Time  4    Period  Weeks    Status  On-going      PT SHORT TERM GOAL #2   Title  Pt will decrease L ankle edema by 1 cm     Baseline  54cm    Time  4    Period  Weeks    Status  On-going      PT SHORT TERM GOAL #3   Title  Pt will demonstrate 3+/5 ankle dorsiflexors     Period  Weeks    Status  Achieved        PT Long Term Goals - 08/19/18 1245      PT LONG TERM GOAL #1   Title  Pt will improve L ankle active DF ROM by 8 deg and PF by 10 deg in order to demonstrate heel strike/ toe off with LRAD on level surfaces     Baseline  active DF 8; active PF 18 deg    Period  Weeks    Status  On-going      PT LONG TERM GOAL #2   Title  Pt will improve gross L ankle strength to 4/5 in order to perform 10sec SLS    Period  Weeks    Status  On-going      PT LONG TERM GOAL #3   Title  Pt will ambulate 500' with LRAD without any increased ankle pain in order to complete ADLs    Time  8    Period  Weeks    Status  On-going            Plan - 08/26/18 1033    Clinical Impression Statement  Progressed to WBAT.   and no boot.  Progress  to 1 crutch .  Patient was able to progress to HEP without  lasting pain.      PT Next Visit Plan  PROM on ankle, retrograde massage, scar mobilization, ankle isometrics, towel srunches, IASTM to calf muscle if needed, calf stretch, ankle pumps, BAPS board, knee and hip strengthening may need desenisitization at some point.     PT Home Exercise Plan  ankle AROM, quad sets, SLR, calf stretch seated;     Consulted and Agree with Plan of Care  Patient       Patient will benefit from skilled therapeutic intervention in order to improve the following deficits and impairments:     Visit Diagnosis: Pain in left ankle and joints of left foot  Stiffness of left foot, not elsewhere classified  Difficulty in walking, not elsewhere classified  Muscle weakness (generalized)     Problem List Patient Active Problem List   Diagnosis Date Noted  . Chronic insomnia 05/22/2017  . Iron deficiency anemia 05/22/2017  . Morbid obesity (HCC) 05/22/2017  . Acid reflux 12/04/2016  . 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 Multiple sclerosis (HCC) 02/13/2013    Meri Pelot PTA 08/26/2018, 10:48 AM  [facility] [address] [place], [place], [address] Phone: [phone]   Fax:  [phone]  Name: Teresa Arnold MRN: [id number] Date of Birth: July 03, 1976

## 2018-08-28 ENCOUNTER — Ambulatory Visit: Payer: Medicare Other | Admitting: Physical Therapy

## 2018-08-28 ENCOUNTER — Encounter: Payer: Self-pay | Admitting: Physical Therapy

## 2018-08-28 DIAGNOSIS — M25675 Stiffness of left foot, not elsewhere classified: Secondary | ICD-10-CM | POA: Diagnosis not present

## 2018-08-28 DIAGNOSIS — R262 Difficulty in walking, not elsewhere classified: Secondary | ICD-10-CM | POA: Diagnosis not present

## 2018-08-28 DIAGNOSIS — M6281 Muscle weakness (generalized): Secondary | ICD-10-CM | POA: Diagnosis not present

## 2018-08-28 DIAGNOSIS — M25572 Pain in left ankle and joints of left foot: Secondary | ICD-10-CM | POA: Diagnosis not present

## 2018-08-28 NOTE — Therapy (Signed)
[facility] Out[facility] Rehabilitation [facility] [address] [place], [place], [address] Phone: [phone]   Fax:  [phone]  Physical Therapy Treatment  Patient Details  Name: Teresa Arnold MRN: [id number] Date of Birth: 1976/01/11 Referring Provider (PT): Ramond Marrow MD   Encounter Date: 08/28/2018  PT End of Session - 08/28/18 1222    Visit Number  4    Number of Visits  16    Date for PT Re-Evaluation  10/08/18    Authorization Type  Medicare ; progress note @ 10 KX modifier @ 15    PT Start Time  1113    PT Stop Time  1200    PT Time Calculation (min)  47 min    Activity Tolerance  Patient tolerated treatment well    Behavior During Therapy  [facility] for tasks assessed/performed       Past Medical History:  Diagnosis Date  . Bronchitis   . Chronic insomnia 05/22/2017  . GERD (gastroesophageal reflux disease)   . Iron deficiency anemia 05/22/2017  . Morbid obesity (HCC) 05/22/2017  . MS (multiple sclerosis) (HCC)     Past Surgical History:  Procedure Laterality Date  . CESAREAN SECTION     x4    There were no vitals filed for this visit.  Subjective Assessment - 08/28/18 1121    Subjective  Patient reports her ankle has been feeling better. She has been using her crutch just for balance and not using the crutch at all in the house.     Pertinent History  MS, HSV-2 infection, morbid obesity     How long can you sit comfortably?  if took medication prior able to sit without increased ankle pain     How long can you stand comfortably?  10 minutes before pain in medial L knee increases     How long can you walk comfortably?  <5 min    Diagnostic tests  Acute minimally displaced fracture of the distal L fibula     Patient Stated Goals  improve ankle ROM and strength in order to walk without boot     Currently in Pain?  Yes    Pain Score  6     Pain Location  Ankle    Pain Orientation  Left    Pain Descriptors / Indicators  Aching;Throbbing    Pain Type  Surgical pain    Pain Onset  More than a month ago    Pain Frequency  Constant    Aggravating Factors   lomger standing, walking     Pain Relieving Factors  sitting up; exercises    Multiple Pain Sites  No                       [facility] Adult PT Treatment/Exercise - 08/28/18 0001      Ambulation/Gait   Gait Comments  in II bars worked on proper left heel strike with a slow single leg stance 6 laps with min/mod cuing      Self-Care   Other Self-Care Comments   dicussed desensitization of the lower maleolus; discussed using something very soft like silk to start then progress.       Manual Therapy   Manual Therapy  Joint mobilization    Manual therapy comments  PROM,  gentle scar tissue retrograde tissue softened more mobile.      Joint Mobilization  AP glide       Ankle Exercises: Seated   Heel Raises  Both;20 reps    Toe Raise  20 reps    Other Seated Ankle Exercises  rocker board forarf and back       Ankle Exercises: Supine   Other Supine Ankle Exercises  ankle ev/ iv PF x10 each red band, mod cuing for technique       Ankle Exercises: Aerobic   Nustep  5 min L3             PT Education - 08/28/18 1220    Education Details  anatomy, HEP, manaul techniques     Person(s) Educated  Patient    Methods  Explanation;Demonstration;Tactile cues;Verbal cues    Comprehension  Verbalized understanding;Returned demonstration;Verbal cues required;Tactile cues required       PT Short Term Goals - 08/19/18 1245      PT SHORT TERM GOAL #1   Title  Pt will improve dorsiflexion by 5 deg    Baseline  8 deg    Time  4    Period  Weeks    Status  On-going      PT SHORT TERM GOAL #2   Title  Pt will decrease L ankle edema by 1 cm     Baseline  54cm    Time  4    Period  Weeks    Status  On-going      PT SHORT TERM GOAL #3   Title  Pt will demonstrate 3+/5 ankle dorsiflexors     Period  Weeks    Status  Achieved        PT Long Term Goals -  08/19/18 1245      PT LONG TERM GOAL #1   Title  Pt will improve L ankle active DF ROM by 8 deg and PF by 10 deg in order to demonstrate heel strike/ toe off with LRAD on level surfaces     Baseline  active DF 8; active PF 18 deg    Period  Weeks    Status  On-going      PT LONG TERM GOAL #2   Title  Pt will improve gross L ankle strength to 4/5 in order to perform 10sec SLS    Period  Weeks    Status  On-going      PT LONG TERM GOAL #3   Title  Pt will ambulate 500' with LRAD without any increased ankle pain in order to complete ADLs    Time  8    Period  Weeks    Status  On-going            Plan - 08/28/18 1225    Clinical Impression Statement  Patient is making good progress. She demostrated good stabilization in the parellell bars when she slows it down and thinks about her weight bearing. She was encouraged to continue this at home. She was advised to continue dstrretching at home . She was also given band exercises to work on at home.     Clinical Presentation  Evolving    Clinical Decision Making  Moderate    Rehab Potential  Good    PT Frequency  2x / week    PT Duration  8 weeks    PT Treatment/Interventions  ADLs/Self Care Home Management;Cryotherapy;Electrical Stimulation;Iontophoresis 4mg /ml Dexamethasone;Moist Heat;Ultrasound;DME Instruction;Gait training;Stair training;Functional mobility training;Therapeutic activities;Therapeutic exercise;Balance training;Neuromuscular re-education;Patient/family education;Manual techniques;Scar mobilization;Passive range of motion;Dry needling;Taping;Vasopneumatic Device    PT Next Visit Plan  PROM on ankle, retrograde massage, scar mobilization, ankle isometrics, towel srunches, IASTM to  calf muscle if needed, calf stretch, ankle pumps, BAPS board, knee and hip strengthening may need desenisitization at some point.     PT Home Exercise Plan  ankle AROM, quad sets, SLR, calf stretch seated;     Consulted and Agree with Plan of  Care  Patient       Patient will benefit from skilled therapeutic intervention in order to improve the following deficits and impairments:  Abnormal gait, Pain, Postural dysfunction, Decreased activity tolerance, Decreased endurance, Decreased range of motion, Decreased strength, Decreased knowledge of precautions, Difficulty walking, Obesity  Visit Diagnosis: Pain in left ankle and joints of left foot  Stiffness of left foot, not elsewhere classified  Difficulty in walking, not elsewhere classified  Muscle weakness (generalized)     Problem List Patient Active Problem List   Diagnosis Date Noted  . Chronic insomnia 05/22/2017  . Iron deficiency anemia 05/22/2017  . Morbid obesity (HCC) 05/22/2017  . Acid reflux 12/04/2016  . 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 Multiple sclerosis (HCC) 02/13/2013     Dessie Coma PT DPT  08/28/2018, 12:29 PM  [facility] [address] [place], [place], [address] Phone: [phone]   Fax:  [phone]  Name: JADENCE LAPLACE MRN: [id number] Date of Birth: [birth year]

## 2018-09-02 ENCOUNTER — Ambulatory Visit: Payer: Medicare Other | Admitting: Physical Therapy

## 2018-09-02 ENCOUNTER — Encounter: Payer: Self-pay | Admitting: Physical Therapy

## 2018-09-02 DIAGNOSIS — M25572 Pain in left ankle and joints of left foot: Secondary | ICD-10-CM | POA: Diagnosis not present

## 2018-09-02 DIAGNOSIS — M25675 Stiffness of left foot, not elsewhere classified: Secondary | ICD-10-CM

## 2018-09-02 DIAGNOSIS — M6281 Muscle weakness (generalized): Secondary | ICD-10-CM | POA: Diagnosis not present

## 2018-09-02 DIAGNOSIS — R262 Difficulty in walking, not elsewhere classified: Secondary | ICD-10-CM | POA: Diagnosis not present

## 2018-09-02 NOTE — Therapy (Signed)
[facility] [address] [place], [place], [address] Phone: [phone]   Fax:  [phone]  Physical Therapy Treatment  Patient Details  Name: Teresa Arnold MRN: [id number] Date of Birth: 1976-02-16 Referring Provider (PT): Ramond Marrow MD   Encounter Date: 09/02/2018  PT End of Session - 09/02/18 1700    Visit Number  5    Number of Visits  16    Date for PT Re-Evaluation  10/08/18    Authorization Type  Medicare ; progress note @ 10 KX modifier @ 15    PT Start Time  1154   Patient was 9 minutes late to appintment    PT Stop Time  1232    PT Time Calculation (min)  38 min    Activity Tolerance  Patient tolerated treatment well    Behavior During Therapy  [facility] for tasks assessed/performed       Past Medical History:  Diagnosis Date  . Bronchitis   . Chronic insomnia 05/22/2017  . GERD (gastroesophageal reflux disease)   . Iron deficiency anemia 05/22/2017  . Morbid obesity (HCC) 05/22/2017  . MS (multiple sclerosis) (HCC)     Past Surgical History:  Procedure Laterality Date  . CESAREAN SECTION     x4    There were no vitals filed for this visit.  Subjective Assessment - 09/02/18 1200    Subjective  Patient reports her pain has been improving and she is standing longer. She holds her crutch but does not use it.     Pertinent History  MS, HSV-2 infection, morbid obesity     How long can you sit comfortably?  if took medication prior able to sit without increased ankle pain     How long can you stand comfortably?  10 minutes before pain in medial L knee increases     How long can you walk comfortably?  <5 min    Diagnostic tests  Acute minimally displaced fracture of the distal L fibula     Patient Stated Goals  improve ankle ROM and strength in order to walk without boot     Currently in Pain?  Yes    Pain Score  2     Pain Location  Ankle    Pain Orientation  Left    Pain Descriptors / Indicators  Aching     Pain Type  Surgical pain    Pain Onset  More than a month ago    Pain Frequency  Constant    Aggravating Factors   standing longer     Pain Relieving Factors  sitting up, exercises                        [facility] Adult PT Treatment/Exercise - 09/02/18 0001      Manual Therapy   Manual Therapy  Joint mobilization    Manual therapy comments  PROM,  gentle scar tissue retrograde tissue softened more mobile.      Joint Mobilization  AP glide              PT Education - 09/02/18 1659    Education Details  reviewed anotomy of condition     Person(s) Educated  Patient    Methods  Explanation;Demonstration;Tactile cues;Verbal cues    Comprehension  Verbalized understanding;Returned demonstration;Verbal cues required;Tactile cues required       PT Short Term Goals - 08/19/18 1245      PT SHORT TERM GOAL #1  Title  Pt will improve dorsiflexion by 5 deg    Baseline  8 deg    Time  4    Period  Weeks    Status  On-going      PT SHORT TERM GOAL #2   Title  Pt will decrease L ankle edema by 1 cm     Baseline  54cm    Time  4    Period  Weeks    Status  On-going      PT SHORT TERM GOAL #3   Title  Pt will demonstrate 3+/5 ankle dorsiflexors     Period  Weeks    Status  Achieved        PT Long Term Goals - 08/19/18 1245      PT LONG TERM GOAL #1   Title  Pt will improve L ankle active DF ROM by 8 deg and PF by 10 deg in order to demonstrate heel strike/ toe off with LRAD on level surfaces     Baseline  active DF 8; active PF 18 deg    Period  Weeks    Status  On-going      PT LONG TERM GOAL #2   Title  Pt will improve gross L ankle strength to 4/5 in order to perform 10sec SLS    Period  Weeks    Status  On-going      PT LONG TERM GOAL #3   Title  Pt will ambulate 500' with LRAD without any increased ankle pain in order to complete ADLs    Time  8    Period  Weeks    Status  On-going            Plan - 09/02/18 1702    Clinical  Impression Statement  Therapy added standing exercises to HEP. No significant increase in pain. Required cuing for seated exercises. Reviewed how to work her HEP. Given standing DF stretch.     Clinical Presentation  Evolving    Clinical Decision Making  Moderate    Rehab Potential  Good    PT Frequency  2x / week    PT Duration  8 weeks    PT Treatment/Interventions  ADLs/Self Care Home Management;Cryotherapy;Electrical Stimulation;Iontophoresis 4mg /ml Dexamethasone;Moist Heat;Ultrasound;DME Instruction;Gait training;Stair training;Functional mobility training;Therapeutic activities;Therapeutic exercise;Balance training;Neuromuscular re-education;Patient/family education;Manual techniques;Scar mobilization;Passive range of motion;Dry needling;Taping;Vasopneumatic Device    PT Next Visit Plan  PROM on ankle, retrograde massage, scar mobilization, ankle isometrics, towel srunches, IASTM to calf muscle if needed, calf stretch, ankle pumps, BAPS board, knee and hip strengthening may need desenisitization at some point.     PT Home Exercise Plan  ankle AROM, quad sets, SLR, calf stretch seated;     Consulted and Agree with Plan of Care  Patient       Patient will benefit from skilled therapeutic intervention in order to improve the following deficits and impairments:  Abnormal gait, Pain, Postural dysfunction, Decreased activity tolerance, Decreased endurance, Decreased range of motion, Decreased strength, Decreased knowledge of precautions, Difficulty walking, Obesity  Visit Diagnosis: Pain in left ankle and joints of left foot  Stiffness of left foot, not elsewhere classified  Difficulty in walking, not elsewhere classified  Muscle weakness (generalized)     Problem List Patient Active Problem List   Diagnosis Date Noted  . Chronic insomnia 05/22/2017  . Iron deficiency anemia 05/22/2017  . Morbid obesity (HCC) 05/22/2017  . Acid reflux 12/04/2016  . 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  Multiple  sclerosis (HCC) 02/13/2013    Dessie Comaavid J Matraca Hunkins PT DPT  09/02/2018, 5:04 PM  [facility] [address] [place], [place], [address] Phone: [phone]   Fax:  [phone]  Name: Bernadene BellShelitha L Oehler MRN: [id number] Date of Birth: 11/03/1975

## 2018-09-05 ENCOUNTER — Encounter: Payer: Self-pay | Admitting: Physical Therapy

## 2018-09-05 ENCOUNTER — Ambulatory Visit: Payer: Medicare Other | Admitting: Physical Therapy

## 2018-09-05 DIAGNOSIS — R262 Difficulty in walking, not elsewhere classified: Secondary | ICD-10-CM | POA: Diagnosis not present

## 2018-09-05 DIAGNOSIS — M6281 Muscle weakness (generalized): Secondary | ICD-10-CM | POA: Diagnosis not present

## 2018-09-05 DIAGNOSIS — M25572 Pain in left ankle and joints of left foot: Secondary | ICD-10-CM | POA: Diagnosis not present

## 2018-09-05 DIAGNOSIS — M25675 Stiffness of left foot, not elsewhere classified: Secondary | ICD-10-CM | POA: Diagnosis not present

## 2018-09-05 NOTE — Therapy (Signed)
[facility] [address] [place], [place], [address] Phone: [phone]   Fax:  [phone]  Physical Therapy Treatment  Patient Details  Name: Teresa Arnold MRN: [id number] Date of Birth: [birth year] Referring Provider (PT): Ramond Marrow MD   Encounter Date: 09/05/2018  PT End of Session - 09/05/18 1154    Visit Number  6    Number of Visits  16    Date for PT Re-Evaluation  10/08/18    Authorization Type  Medicare ; progress note @ 10 KX modifier @ 15    PT Start Time  1145    PT Stop Time  1228    PT Time Calculation (min)  43 min    Activity Tolerance  Patient tolerated treatment well    Behavior During Therapy  [facility] for tasks assessed/performed       Past Medical History:  Diagnosis Date  . Bronchitis   . Chronic insomnia 05/22/2017  . GERD (gastroesophageal reflux disease)   . Iron deficiency anemia 05/22/2017  . Morbid obesity (HCC) 05/22/2017  . MS (multiple sclerosis) (HCC)     Past Surgical History:  Procedure Laterality Date  . CESAREAN SECTION     x4    There were no vitals filed for this visit.  Subjective Assessment - 09/05/18 1152    Subjective  Patient has no been using her crutch. She reportes not signficiant increase in pain. She is having no pain in the ankle this morning.     Pertinent History  MS, HSV-2 infection, morbid obesity     How long can you sit comfortably?  if took medication prior able to sit without increased ankle pain     How long can you stand comfortably?  10 minutes before pain in medial L knee increases     How long can you walk comfortably?  <5 min    Diagnostic tests  Acute minimally displaced fracture of the distal L fibula     Patient Stated Goals  improve ankle ROM and strength in order to walk without boot     Currently in Pain?  No/denies                       [facility] Adult PT Treatment/Exercise - 09/05/18 0001      Ambulation/Gait   Gait Comments   walking in the II bars with cuing to go slow through left stance phase with UE support and without UE support.       Manual Therapy   Manual Therapy  Joint mobilization    Manual therapy comments  PROM,  gentle scar tissue retrograde tissue softened more mobile.      Joint Mobilization  AP glide       Ankle Exercises: Standing   Other Standing Ankle Exercises  standing heel raise x10; standing march 2x10; standing DF/caqlf stretch 3x10 sec hold     Other Standing Ankle Exercises  step onto air-ex forward x15 sideways x15              PT Education - 09/05/18 1153    Education Details  reviewed standing exercises     Person(s) Educated  Patient    Methods  Explanation;Tactile cues;Demonstration;Verbal cues    Comprehension  Verbalized understanding;Returned demonstration;Verbal cues required;Tactile cues required       PT Short Term Goals - 09/05/18 1648      PT SHORT TERM GOAL #1   Title  Pt will improve  dorsiflexion by 5 deg    Baseline  8 deg    Time  4    Period  Weeks    Status  On-going      PT SHORT TERM GOAL #2   Title  Pt will decrease L ankle edema by 1 cm     Baseline  54cm    Time  4    Period  Weeks    Status  On-going      PT SHORT TERM GOAL #3   Title  Pt will demonstrate 3+/5 ankle dorsiflexors     Baseline  deferred at eval secondary to pain     Time  4    Period  Weeks    Status  Achieved        PT Long Term Goals - 08/19/18 1245      PT LONG TERM GOAL #1   Title  Pt will improve L ankle active DF ROM by 8 deg and PF by 10 deg in order to demonstrate heel strike/ toe off with LRAD on level surfaces     Baseline  active DF 8; active PF 18 deg    Period  Weeks    Status  On-going      PT LONG TERM GOAL #2   Title  Pt will improve gross L ankle strength to 4/5 in order to perform 10sec SLS    Period  Weeks    Status  On-going      PT LONG TERM GOAL #3   Title  Pt will ambulate 500' with LRAD without any increased ankle pain in order to  complete ADLs    Time  8    Period  Weeks    Status  On-going            Plan - 09/05/18 1211    Clinical Impression Statement  Therapy focused on git training and weight bearing exercises today,. She is making great progresss. Her range was end range after stretching and mobilizations. She is still having some tenderness with end range Df anround the talus. She tolerated standing exercises very well. Patient proacticed going straight through with stance phase instead of shifting to the side. She improved with practice. She also started intability training.     Clinical Presentation  Evolving    Clinical Decision Making  Moderate    Rehab Potential  Good    PT Frequency  2x / week    PT Treatment/Interventions  ADLs/Self Care Home Management;Cryotherapy;Electrical Stimulation;Iontophoresis 4mg /ml Dexamethasone;Moist Heat;Ultrasound;DME Instruction;Gait training;Stair training;Functional mobility training;Therapeutic activities;Therapeutic exercise;Balance training;Neuromuscular re-education;Patient/family education;Manual techniques;Scar mobilization;Passive range of motion;Dry needling;Taping;Vasopneumatic Device    PT Next Visit Plan  PROM on ankle, retrograde massage, scar mobilization, ankle isometrics, towel srunches, IASTM to calf muscle if needed, calf stretch, ankle pumps, BAPS board, knee and hip strengthening may need desenisitization at some point.     PT Home Exercise Plan  ankle AROM, quad sets, SLR, calf stretch seated;     Consulted and Agree with Plan of Care  Patient       Patient will benefit from skilled therapeutic intervention in order to improve the following deficits and impairments:  Abnormal gait, Pain, Postural dysfunction, Decreased activity tolerance, Decreased endurance, Decreased range of motion, Decreased strength, Decreased knowledge of precautions, Difficulty walking, Obesity  Visit Diagnosis: Pain in left ankle and joints of left foot  Stiffness of left  foot, not elsewhere classified  Difficulty in walking, not elsewhere classified  Muscle weakness (generalized)  Problem List Patient Active Problem List   Diagnosis Date Noted  . Chronic insomnia 05/22/2017  . Iron deficiency anemia 05/22/2017  . Morbid obesity (HCC) 05/22/2017  . Acid reflux 12/04/2016  . 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 Multiple sclerosis (HCC) 02/13/2013    Dessie Comaavid J Guy Toney  PT DPT  09/05/2018, 4:54 PM  [facility] [address] [place], [place], [address] Phone: [phone]   Fax:  [phone]  Name: Teresa Arnold MRN: [id number] Date of Birth: 07/28/1976

## 2018-09-09 ENCOUNTER — Ambulatory Visit: Payer: Medicare Other | Admitting: Physical Therapy

## 2018-09-09 DIAGNOSIS — M6281 Muscle weakness (generalized): Secondary | ICD-10-CM | POA: Diagnosis not present

## 2018-09-09 DIAGNOSIS — M25572 Pain in left ankle and joints of left foot: Secondary | ICD-10-CM

## 2018-09-09 DIAGNOSIS — M25675 Stiffness of left foot, not elsewhere classified: Secondary | ICD-10-CM

## 2018-09-09 DIAGNOSIS — R262 Difficulty in walking, not elsewhere classified: Secondary | ICD-10-CM

## 2018-09-10 NOTE — Therapy (Signed)
[facility] [address] [place], [place], [address] Phone: [phone]   Fax:  [phone]  Physical Therapy Treatment  Patient Details  Name: Teresa Arnold MRN: [id number] Date of Birth: Nov 04, 1975 Referring Provider (PT): Ramond Marrow MD   Encounter Date: 09/09/2018  PT End of Session - 09/09/18 1224    Visit Number  7    Number of Visits  16    Authorization Type  Medicare ; progress note @ 10 KX modifier @ 15    PT Start Time  1150    PT Stop Time  1230    PT Time Calculation (min)  40 min    Activity Tolerance  Patient tolerated treatment well    Behavior During Therapy  [facility] for tasks assessed/performed       Past Medical History:  Diagnosis Date  . Bronchitis   . Chronic insomnia 05/22/2017  . GERD (gastroesophageal reflux disease)   . Iron deficiency anemia 05/22/2017  . Morbid obesity (HCC) 05/22/2017  . MS (multiple sclerosis) (HCC)     Past Surgical History:  Procedure Laterality Date  . CESAREAN SECTION     x4    There were no vitals filed for this visit.  Subjective Assessment - 09/10/18 0732    Subjective  Patient walks in with improved gait. She is having no pain today. She has been working on her exercises.     Pertinent History  MS, HSV-2 infection, morbid obesity     How long can you sit comfortably?  if took medication prior able to sit without increased ankle pain     How long can you stand comfortably?  10 minutes before pain in medial L knee increases     How long can you walk comfortably?  <5 min    Diagnostic tests  Acute minimally displaced fracture of the distal L fibula     Patient Stated Goals  improve ankle ROM and strength in order to walk without boot     Currently in Pain?  No/denies                       [facility] Adult PT Treatment/Exercise - 09/10/18 0001      Manual Therapy   Manual Therapy  Joint mobilization    Manual therapy comments  PROM,  gentle scar  tissue retrograde tissue softened more mobile.      Joint Mobilization  AP glide       Ankle Exercises: Standing   Other Standing Ankle Exercises  standing heel raise x10; standing march 2x10; standing DF/caqlf stretch 3x10 sec hold     Other Standing Ankle Exercises  step onto air-ex forward x15 sideways x15; step up 2x10; side step x10;       Ankle Exercises: Seated   Heel Raises  Both;20 reps    Toe Raise  20 reps    Other Seated Ankle Exercises  windsheild wiper x20              PT Education - 09/10/18 0733    Education Details  reviewed HEP and symptom management     Person(s) Educated  Patient    Methods  Explanation;Demonstration;Tactile cues;Verbal cues    Comprehension  Verbalized understanding;Returned demonstration;Verbal cues required;Tactile cues required       PT Short Term Goals - 09/10/18 0736      PT SHORT TERM GOAL #1   Title  Pt will improve dorsiflexion by 5 deg  Baseline  8 deg    Time  4    Period  Weeks    Status  On-going      PT SHORT TERM GOAL #2   Title  Pt will decrease L ankle edema by 1 cm     Baseline  54cm    Time  4    Period  Weeks    Status  On-going      PT SHORT TERM GOAL #3   Title  Pt will demonstrate 3+/5 ankle dorsiflexors     Baseline  deferred at eval secondary to pain     Time  4    Period  Weeks    Status  On-going        PT Long Term Goals - 08/19/18 1245      PT LONG TERM GOAL #1   Title  Pt will improve L ankle active DF ROM by 8 deg and PF by 10 deg in order to demonstrate heel strike/ toe off with LRAD on level surfaces     Baseline  active DF 8; active PF 18 deg    Period  Weeks    Status  On-going      PT LONG TERM GOAL #2   Title  Pt will improve gross L ankle strength to 4/5 in order to perform 10sec SLS    Period  Weeks    Status  On-going      PT LONG TERM GOAL #3   Title  Pt will ambulate 500' with LRAD without any increased ankle pain in order to complete ADLs    Time  8    Period  Weeks     Status  On-going            Plan - 09/10/18 0734    Clinical Impression Statement  Patient continues to perfrom more standing exrecises. Therapy added leg press today. She reported no pain with ther-ex. She did report some pain in her talus with PROM and talor glides.     Clinical Presentation  Evolving    Clinical Decision Making  Moderate    Rehab Potential  Good    PT Frequency  2x / week    PT Duration  8 weeks    PT Treatment/Interventions  ADLs/Self Care Home Management;Cryotherapy;Electrical Stimulation;Iontophoresis 4mg /ml Dexamethasone;Moist Heat;Ultrasound;DME Instruction;Gait training;Stair training;Functional mobility training;Therapeutic activities;Therapeutic exercise;Balance training;Neuromuscular re-education;Patient/family education;Manual techniques;Scar mobilization;Passive range of motion;Dry needling;Taping;Vasopneumatic Device    PT Next Visit Plan  PROM on ankle, retrograde massage, scar mobilization, ankle isometrics, towel srunches, IASTM to calf muscle if needed, calf stretch, ankle pumps, BAPS board, knee and hip strengthening may need desenisitization at some point.     PT Home Exercise Plan  ankle AROM, quad sets, SLR, calf stretch seated;     Consulted and Agree with Plan of Care  Patient       Patient will benefit from skilled therapeutic intervention in order to improve the following deficits and impairments:  Abnormal gait, Pain, Postural dysfunction, Decreased activity tolerance, Decreased endurance, Decreased range of motion, Decreased strength, Decreased knowledge of precautions, Difficulty walking, Obesity  Visit Diagnosis: Pain in left ankle and joints of left foot  Stiffness of left foot, not elsewhere classified  Difficulty in walking, not elsewhere classified  Muscle weakness (generalized)     Problem List Patient Active Problem List   Diagnosis Date Noted  . Chronic insomnia 05/22/2017  . Iron deficiency anemia 05/22/2017  .  Morbid obesity (HCC) 05/22/2017  .  Acid reflux 12/04/2016  . 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 Multiple sclerosis (HCC) 02/13/2013    Dessie Comaavid J Rozanne Heumann PT DPT  09/10/2018, 7:38 AM  [facility] [address] [place], [place], [address] Phone: [phone]   Fax:  [phone]  Name: Bernadene BellShelitha L Burgo MRN: [id number] Date of Birth: 02/05/1976

## 2018-09-12 ENCOUNTER — Encounter: Payer: Self-pay | Admitting: Physical Therapy

## 2018-09-12 ENCOUNTER — Ambulatory Visit: Payer: Medicare Other | Attending: Orthopaedic Surgery | Admitting: Physical Therapy

## 2018-09-12 DIAGNOSIS — M25572 Pain in left ankle and joints of left foot: Secondary | ICD-10-CM | POA: Insufficient documentation

## 2018-09-12 DIAGNOSIS — M25675 Stiffness of left foot, not elsewhere classified: Secondary | ICD-10-CM | POA: Insufficient documentation

## 2018-09-12 DIAGNOSIS — M6281 Muscle weakness (generalized): Secondary | ICD-10-CM | POA: Insufficient documentation

## 2018-09-12 DIAGNOSIS — R262 Difficulty in walking, not elsewhere classified: Secondary | ICD-10-CM | POA: Diagnosis present

## 2018-09-12 NOTE — Therapy (Signed)
[facility] [address] [place], [place], [address] Phone: [phone]   Fax:  [phone]  Physical Therapy Treatment  Patient Details  Name: Teresa Arnold MRN: [id number] Date of Birth: October 09, 1975 Referring Provider (PT): Ramond Marrow MD   Encounter Date: 09/12/2018  PT End of Session - 09/12/18 1207    Visit Number  8    Number of Visits  16    Date for PT Re-Evaluation  10/08/18    Authorization Type  Medicare ; progress note @ 10 KX modifier @ 15    PT Start Time  1200    PT Stop Time  1240    PT Time Calculation (min)  40 min    Activity Tolerance  Patient tolerated treatment well    Behavior During Therapy  [facility] for tasks assessed/performed       Past Medical History:  Diagnosis Date  . Bronchitis   . Chronic insomnia 05/22/2017  . GERD (gastroesophageal reflux disease)   . Iron deficiency anemia 05/22/2017  . Morbid obesity (HCC) 05/22/2017  . MS (multiple sclerosis) (HCC)     Past Surgical History:  Procedure Laterality Date  . CESAREAN SECTION     x4    There were no vitals filed for this visit.  Subjective Assessment - 09/12/18 1203    Subjective  Patient reports her ankle is feeling good. She is having pain in her knee at this time.     Pertinent History  MS, HSV-2 infection, morbid obesity     How long can you sit comfortably?  if took medication prior able to sit without increased ankle pain     How long can you stand comfortably?  10 minutes before pain in medial L knee increases     How long can you walk comfortably?  <5 min    Diagnostic tests  Acute minimally displaced fracture of the distal L fibula     Patient Stated Goals  improve ankle ROM and strength in order to walk without boot     Currently in Pain?  Yes    Pain Score  5     Pain Location  Knee    Pain Orientation  Left    Pain Descriptors / Indicators  Aching    Pain Type  Surgical pain    Pain Onset  More than a month ago    Pain  Frequency  Constant    Aggravating Factors   standing longer     Pain Relieving Factors  sitting up                       [facility] Adult PT Treatment/Exercise - 09/12/18 0001      Knee/Hip Exercises: Stretches   Active Hamstring Stretch Limitations  seated 3x20 sec hold; supine 90/90 3x20 sec hold     Other Knee/Hip Stretches  thomas stretch seated 3x20 sec hold       Manual Therapy   Manual Therapy  Joint mobilization    Manual therapy comments  PROM,  gentle scar tissue retrograde tissue softened more mobile.      Joint Mobilization  AP glide       Ankle Exercises: Supine   Other Supine Ankle Exercises  quad set 2x10; SLR 2x10;       Ankle Exercises: Standing   Other Standing Ankle Exercises  standing heel raise x10; standing march 2x10; standing DF/caqlf stretch 3x10 sec hold     Other  Standing Ankle Exercises  step onto air-ex forward x15 sideways x15; step up 2x10; side step x10;              PT Education - 09/12/18 1206    Education Details  reviewed HEP and symptom management     Person(s) Educated  Patient    Methods  Explanation;Demonstration;Tactile cues;Verbal cues    Comprehension  Verbalized understanding;Verbal cues required;Returned demonstration;Tactile cues required       PT Short Term Goals - 09/10/18 0736      PT SHORT TERM GOAL #1   Title  Pt will improve dorsiflexion by 5 deg    Baseline  8 deg    Time  4    Period  Weeks    Status  On-going      PT SHORT TERM GOAL #2   Title  Pt will decrease L ankle edema by 1 cm     Baseline  54cm    Time  4    Period  Weeks    Status  On-going      PT SHORT TERM GOAL #3   Title  Pt will demonstrate 3+/5 ankle dorsiflexors     Baseline  deferred at eval secondary to pain     Time  4    Period  Weeks    Status  On-going        PT Long Term Goals - 08/19/18 1245      PT LONG TERM GOAL #1   Title  Pt will improve L ankle active DF ROM by 8 deg and PF by 10 deg in order to  demonstrate heel strike/ toe off with LRAD on level surfaces     Baseline  active DF 8; active PF 18 deg    Period  Weeks    Status  On-going      PT LONG TERM GOAL #2   Title  Pt will improve gross L ankle strength to 4/5 in order to perform 10sec SLS    Period  Weeks    Status  On-going      PT LONG TERM GOAL #3   Title  Pt will ambulate 500' with LRAD without any increased ankle pain in order to complete ADLs    Time  8    Period  Weeks    Status  On-going            Plan - 09/12/18 1224    Clinical Impression Statement  Patient has pain in her medial patellar tendon per palpation. She was shown light stretching for her leg that will hopefully help her knee and ankle. She had no increase in pain with treatment, Patients ankle is progressing well. She will likley D/C to HEP next visit.     Clinical Presentation  Evolving    Clinical Decision Making  Moderate    Rehab Potential  Good    PT Frequency  2x / week    PT Duration  8 weeks    PT Treatment/Interventions  ADLs/Self Care Home Management;Cryotherapy;Electrical Stimulation;Iontophoresis 4mg /ml Dexamethasone;Moist Heat;Ultrasound;DME Instruction;Gait training;Stair training;Functional mobility training;Therapeutic activities;Therapeutic exercise;Balance training;Neuromuscular re-education;Patient/family education;Manual techniques;Scar mobilization;Passive range of motion;Dry needling;Taping;Vasopneumatic Device    PT Next Visit Plan  PROM on ankle, retrograde massage, scar mobilization, ankle isometrics, towel srunches, IASTM to calf muscle if needed, calf stretch, ankle pumps, BAPS board, knee and hip strengthening may need desenisitization at some point.     PT Home Exercise Plan  ankle AROM, quad sets, SLR, calf  stretch seated;        Patient will benefit from skilled therapeutic intervention in order to improve the following deficits and impairments:  Abnormal gait, Pain, Postural dysfunction, Decreased activity  tolerance, Decreased endurance, Decreased range of motion, Decreased strength, Decreased knowledge of precautions, Difficulty walking, Obesity  Visit Diagnosis: Pain in left ankle and joints of left foot  Stiffness of left foot, not elsewhere classified  Difficulty in walking, not elsewhere classified  Muscle weakness (generalized)     Problem List Patient Active Problem List   Diagnosis Date Noted  . Chronic insomnia 05/22/2017  . Iron deficiency anemia 05/22/2017  . Morbid obesity (HCC) 05/22/2017  . Acid reflux 12/04/2016  . 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 Multiple sclerosis (HCC) 02/13/2013    Dessie Coma PT DPT  09/12/2018, 2:31 PM  [facility] [address] [place], [place], [address] Phone: [phone]   Fax:  [phone]  Name: Teresa Arnold MRN: [id number] Date of Birth: [birth year]

## 2018-09-17 ENCOUNTER — Ambulatory Visit: Payer: Medicare Other | Admitting: Physical Therapy

## 2018-09-17 DIAGNOSIS — R262 Difficulty in walking, not elsewhere classified: Secondary | ICD-10-CM | POA: Diagnosis not present

## 2018-09-17 DIAGNOSIS — M25572 Pain in left ankle and joints of left foot: Secondary | ICD-10-CM | POA: Diagnosis not present

## 2018-09-17 DIAGNOSIS — M25675 Stiffness of left foot, not elsewhere classified: Secondary | ICD-10-CM | POA: Diagnosis not present

## 2018-09-17 DIAGNOSIS — M6281 Muscle weakness (generalized): Secondary | ICD-10-CM

## 2018-09-18 NOTE — Therapy (Signed)
[place], [place], [address] Phone: [phone]   Fax:  [phone]  Physical Therapy Treatment/Dischsarge   Patient Details  Name: Teresa Arnold MRN: [id number] Date of Birth: 02-05-1976 Referring Provider (PT): Ophelia Charter MD  Progress Note Reporting Period 08/14/2019   to 09/18/2018  See note below for Objective Data and Assessment of Progress/Goals.       Encounter Date: 09/17/2018  PT End of Session - 09/17/18 1116    Visit Number  9    Number of Visits  16    Date for PT Re-Evaluation  10/08/18    Authorization Type  Medicare ; progress note @ 10 KX modifier @ 15    PT Start Time  1100    PT Stop Time  1142    PT Time Calculation (min)  42 min    Activity Tolerance  Patient tolerated treatment well    Behavior During Therapy  WFL for tasks assessed/performed       Past Medical History:  Diagnosis Date  . Bronchitis   . Chronic insomnia 05/22/2017  . GERD (gastroesophageal reflux disease)   . Iron deficiency anemia 05/22/2017  . Morbid obesity ([place]) 05/22/2017  . MS (multiple sclerosis) ([place])     Past Surgical History:  Procedure Laterality Date  . CESAREAN SECTION     x4    There were no vitals filed for this visit.  Subjective Assessment - 09/18/18 1728    Subjective  Patients knee is feeling better with the icing and quad sets. She feels like her ankle is better. She feels conmfortabel with her exercises.     Pertinent History  MS, HSV-2 infection, morbid obesity     How long can you sit comfortably?  if took medication prior able to sit without increased ankle pain     How long can you stand comfortably?  10 minutes before pain in medial L knee increases     How long can you walk comfortably?  <5 min    Diagnostic tests  Acute minimally displaced fracture of the distal L fibula     Patient Stated Goals  improve ankle ROM and strength in order to walk without boot     Currently  in Pain?  Yes    Pain Score  2     Pain Orientation  Left    Pain Descriptors / Indicators  Aching    Pain Type  Surgical pain    Pain Onset  More than a month ago    Pain Frequency  Constant    Aggravating Factors   stnaidng longer          [facility] PT Assessment - 09/18/18 0001      AROM   Right Ankle Dorsiflexion  10    Right Ankle Plantar Flexion  30    Right Ankle Inversion  --   18   Right Ankle Eversion  10      Strength   Right Hip Flexion  4+/5    Right Hip ABduction  5/5    Right Hip ADduction  4+/5    Left Hip Flexion  5/5    Left Hip ABduction  5/5    Left Hip ADduction  4+/5      Palpation   Palpation comment  mild tenderness to plaption of the right patellar tendon       Ambulation/Gait   Gait Comments  mild decreasi in single leg stance time on  the right.                    [facility] Adult PT Treatment/Exercise - 09/18/18 0001      Self-Care   Other Self-Care Comments   reviewed home program and activity progression       Knee/Hip Exercises: Stretches   Active Hamstring Stretch Limitations  seated 3x20 sec hold; supine 90/90 3x20 sec hold     Other Knee/Hip Stretches  thomas stretch seated 3x20 sec hold       Manual Therapy   Manual Therapy  Joint mobilization    Manual therapy comments  PROM,  gentle scar tissue retrograde tissue softened more mobile.      Joint Mobilization  AP glide       Ankle Exercises: Standing   Other Standing Ankle Exercises  standing heel raise x10; standing march 2x10; standing DF/caqlf stretch 3x10 sec hold     Other Standing Ankle Exercises  step onto air-ex forward x15 sideways x15; step up 2x10; side step x10;       Ankle Exercises: Supine   Other Supine Ankle Exercises  quad set 2x10; SLR 2x10;       Ankle Exercises: Seated   Heel Raises  Both;20 reps    Toe Raise  20 reps    Other Seated Ankle Exercises  windsheild wiper x20              PT Education - 09/18/18 1730    Education Details  HEP,  symptom manegement     Person(s) Educated  Patient    Methods  Explanation;Demonstration;Tactile cues;Verbal cues    Comprehension  Verbalized understanding;Returned demonstration;Verbal cues required;Tactile cues required       PT Short Term Goals - 09/18/18 1731      PT SHORT TERM GOAL #1   Title  Pt will improve dorsiflexion by 5 deg    Baseline  8 deg    Time  4    Period  Weeks    Status  On-going      PT SHORT TERM GOAL #2   Title  Pt will decrease L ankle edema by 1 cm     Baseline  54cm    Time  4    Period  Weeks    Status  On-going      PT SHORT TERM GOAL #3   Title  Pt will demonstrate 3+/5 ankle dorsiflexors     Baseline  deferred at eval secondary to pain     Time  4    Period  Weeks    Status  On-going        PT Long Term Goals - 08/19/18 1245      PT LONG TERM GOAL #1   Title  Pt will improve L ankle active DF ROM by 8 deg and PF by 10 deg in order to demonstrate heel strike/ toe off with LRAD on level surfaces     Baseline  active DF 8; active PF 18 deg    Period  Weeks    Status  On-going      PT LONG TERM GOAL #2   Title  Pt will improve gross L ankle strength to 4/5 in order to perform 10sec SLS    Period  Weeks    Status  On-going      PT LONG TERM GOAL #3   Title  Pt will ambulate 500' with LRAD without any increased ankle pain in order to  complete ADLs    Time  8    Period  Weeks    Status  On-going            Plan - 09/18/18 1730    Clinical Impression Statement  Patient has met goals for therapy. Her range has improved significantly. She is having no pain in her ankle. Her antalgic gait has imporved. She continues to have knee pain but that is improving as well. She appears to have platellar tendiinitis. She was advised to go back to her MD if this does not improve.     Clinical Presentation  Evolving    Clinical Decision Making  Moderate    Rehab Potential  Good    PT Frequency  2x / week    PT Duration  8 weeks    PT  Treatment/Interventions  ADLs/Self Care Home Management;Cryotherapy;Electrical Stimulation;Iontophoresis 21m/ml Dexamethasone;Moist Heat;Ultrasound;DME Instruction;Gait training;Stair training;Functional mobility training;Therapeutic activities;Therapeutic exercise;Balance training;Neuromuscular re-education;Patient/family education;Manual techniques;Scar mobilization;Passive range of motion;Dry needling;Taping;Vasopneumatic Device    PT Next Visit Plan  PROM on ankle, retrograde massage, scar mobilization, ankle isometrics, towel srunches, IASTM to calf muscle if needed, calf stretch, ankle pumps, BAPS board, knee and hip strengthening may need desenisitization at some point.     PT Home Exercise Plan  ankle AROM, quad sets, SLR, calf stretch seated;     Consulted and Agree with Plan of Care  Patient       Patient will benefit from skilled therapeutic intervention in order to improve the following deficits and impairments:  Abnormal gait, Pain, Postural dysfunction, Decreased activity tolerance, Decreased endurance, Decreased range of motion, Decreased strength, Decreased knowledge of precautions, Difficulty walking, Obesity  Visit Diagnosis: Pain in left ankle and joints of left foot  Stiffness of left foot, not elsewhere classified  Difficulty in walking, not elsewhere classified  Muscle weakness (generalized)     Problem List Patient Active Problem List   Diagnosis Date Noted  . Chronic insomnia 05/22/2017  . Iron deficiency anemia 05/22/2017  . Morbid obesity ([place] 05/22/2017  . Acid reflux 12/04/2016  . 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 Multiple [place] ([place] 02/13/2013   .PHYSICAL THERAPY DISCHARGE SUMMARY  Visits from Start of Care:9  Current functional level related to goals / functional outcomes: significant improvement in ability to ambualte    Remaining deficits: Right knee pain    Education / Equipment: HEP   Plan: Patient agrees to discharge.  Patient goals  were met. Patient is being discharged due to meeting the stated rehab goals.  ?????      DCarney LivingPT DPT  09/18/2018, 5:34 PM  [facility]142 Peg Shop [address]GBang[place] [address]Phone: [phone]  Fax:  [phone] Name: SZAKYIA GAGANMRN: [id number] of Birth: [birth year]

## 2018-10-01 DIAGNOSIS — S8262XD Displaced fracture of lateral malleolus of left fibula, subsequent encounter for closed fracture with routine healing: Secondary | ICD-10-CM | POA: Diagnosis not present

## 2018-11-01 ENCOUNTER — Ambulatory Visit (INDEPENDENT_AMBULATORY_CARE_PROVIDER_SITE_OTHER): Payer: Medicare Other | Admitting: Family Medicine

## 2018-11-01 ENCOUNTER — Encounter: Payer: Self-pay | Admitting: Family Medicine

## 2018-11-01 VITALS — BP 138/92 | HR 80 | Temp 98.4°F | Ht 64.0 in | Wt 249.0 lb

## 2018-11-01 DIAGNOSIS — R300 Dysuria: Secondary | ICD-10-CM | POA: Insufficient documentation

## 2018-11-01 DIAGNOSIS — N390 Urinary tract infection, site not specified: Secondary | ICD-10-CM | POA: Diagnosis not present

## 2018-11-01 DIAGNOSIS — E66813 Obesity, class 3: Secondary | ICD-10-CM | POA: Insufficient documentation

## 2018-11-01 DIAGNOSIS — Z6841 Body Mass Index (BMI) 40.0 and over, adult: Secondary | ICD-10-CM

## 2018-11-01 DIAGNOSIS — R350 Frequency of micturition: Secondary | ICD-10-CM | POA: Diagnosis not present

## 2018-11-01 DIAGNOSIS — Z09 Encounter for follow-up examination after completed treatment for conditions other than malignant neoplasm: Secondary | ICD-10-CM | POA: Diagnosis not present

## 2018-11-01 DIAGNOSIS — Z202 Contact with and (suspected) exposure to infections with a predominantly sexual mode of transmission: Secondary | ICD-10-CM | POA: Diagnosis not present

## 2018-11-01 LAB — POCT URINALYSIS DIP (MANUAL ENTRY)
Bilirubin, UA: NEGATIVE
Blood, UA: NEGATIVE
Glucose, UA: NEGATIVE mg/dL
Ketones, POC UA: NEGATIVE mg/dL
Nitrite, UA: NEGATIVE
Protein Ur, POC: NEGATIVE mg/dL
Spec Grav, UA: 1.025 (ref 1.010–1.025)
Urobilinogen, UA: 0.2 E.U./dL
pH, UA: 5.5 (ref 5.0–8.0)

## 2018-11-01 MED ORDER — NITROFURANTOIN MONOHYD MACRO 100 MG PO CAPS: 100.0000 mg | ORAL_CAPSULE | Freq: Two times a day (BID) | ORAL | 0 refills | Status: AC

## 2018-11-01 NOTE — Patient Instructions (Signed)

## 2018-11-01 NOTE — Progress Notes (Signed)
[facility]  Sick Visit  Subjective:  Patient ID: Teresa Arnold, female    DOB: [birth year]  Age: 43 y.o. MRN: [id number]  CC:  Chief Complaint  Patient presents with  . Exposure to STD    HPI Teresa Arnold is a 42 year presents for Sick Visit today.   Past Medical History:  Diagnosis Date  . Bronchitis   . Chronic insomnia 05/22/2017  . GERD (gastroesophageal reflux disease)   . Iron deficiency anemia 05/22/2017  . Morbid obesity (HCC) 05/22/2017  . MS (multiple sclerosis) (HCC)     Current Status: Since her last office visit, she is doing well with no complaints. She reports possible STD exposure. She reports dysuria, odor, and urinary frequency X 1 week.  She denies discharge, urinary itching, burning,hematuria, and suprapubic pain/discomfort. Her anxiety is moderate today, based on situation. She denies suicidal ideations, homicidal ideations, or auditory hallucinations.  She denies fevers, chills, fatigue, recent infections, weight loss, and night sweats. She has not had any headaches, visual changes, dizziness, and falls. No chest pain, heart palpitations, cough and shortness of breath reported. No reports of GI problems such as nausea, vomiting, diarrhea, and constipation. She has no reports of blood in stools, dysuria and hematuria. She denies pain today.   Past Surgical History:  Procedure Laterality Date  . CESAREAN SECTION     x4    Family History  Problem Relation Age of Onset  . Hypertension Mother   . Hypertension Other     Social History   Socioeconomic History  . Marital status: Single    Spouse name: Not on file  . Number of children: 4  . Years of education: 44  . Highest education level: Not on file  Occupational History  . Not on file  Social Needs  . Financial resource strain: Not on file  . Food insecurity:    Worry: Not on file    Inability: Not on file  . Transportation needs:    Medical: Not on file    Non-medical: Not on file  Tobacco Use  . Smoking status: Never Smoker  . Smokeless tobacco: Never Used  Substance and Sexual Activity  . Alcohol use: Yes    Alcohol/week: 0.0 standard drinks    Comment: occasionally  . Drug use: No  . Sexual activity: Yes    Birth control/protection: None  Lifestyle  . Physical activity:    Days per week: Not on file    Minutes per session: Not on file  . Stress: Not on file  Relationships  . Social connections:    Talks on phone: Not on file    Gets together: Not on file    Attends religious service: Not on file    Active member of club or organization: Not on file    Attends meetings of clubs or organizations: Not on file    Relationship status: Not on file  . Intimate partner violence:    Fear of current or ex partner: Not on file    Emotionally abused: Not on file    Physically abused: Not on file    Forced sexual activity: Not on file  Other Topics Concern  . Not on file  Social History Narrative   Patient is single and lives with her family.   Patient has four children.   Patient has a college education.   Patient is right-handed.   Patient does not drink any  caffeine.    Outpatient Medications Prior to Visit  Medication Sig Dispense Refill  . pantoprazole (PROTONIX) 40 MG tablet TAKE 1 TABLET BY MOUTH EVERY DAY 30 tablet 10  . Siponimod Fumarate (MAYZENT PO) Take by mouth.    . valACYclovir (VALTREX) 1000 MG tablet Take 1 tablet (1,000 mg total) by mouth daily. 90 tablet 2  . acetaminophen (TYLENOL) 325 MG tablet Take 650 mg by mouth every 6 (six) hours as needed for moderate pain.     . traZODone (DESYREL) 50 MG tablet TAKE 1 TABLET (50 MG TOTAL) AT BEDTIME BY MOUTH. (Patient not taking: Reported on 11/01/2018) 90 tablet 0  . ondansetron (ZOFRAN) 4 MG tablet Take 1 tablet (4 mg total) by mouth every 6 (six) hours. (Patient not taking: Reported on 08/13/2018) 12 tablet 0   No facility-administered  medications prior to visit.     Allergies  Allergen Reactions  . Other Shortness Of Breath and Itching    Tampons  . Gadolinium Derivatives Nausea And Vomiting    Pt only received half dose of 90ml before getting sick.   . Sulfa Antibiotics Itching and Rash  . Tysabri [Natalizumab] Rash    ROS Review of Systems  Constitutional: Negative.   HENT: Negative.   Eyes: Negative.   Respiratory: Negative.   Cardiovascular: Negative.   Gastrointestinal: Positive for abdominal distention (Obese).  Endocrine: Negative.   Genitourinary: Positive for dysuria and frequency.  Musculoskeletal: Negative.   Skin: Negative.   Allergic/Immunologic: Negative.   Neurological: Negative.   Hematological: Negative.   Psychiatric/Behavioral: Positive for sleep disturbance (insomnia).   Objective:    Physical Exam  Constitutional: She is oriented to person, place, and time. She appears well-developed and well-nourished.  HENT:  Head: Normocephalic and atraumatic.  Eyes: Conjunctivae are normal.  Neck: Normal range of motion. Neck supple.  Cardiovascular: Normal rate, regular rhythm, normal heart sounds and intact distal pulses.  Pulmonary/Chest: Effort normal and breath sounds normal.  Abdominal: Soft. Bowel sounds are normal. She exhibits distension (Obesity).  Musculoskeletal: Normal range of motion.  Neurological: She is alert and oriented to person, place, and time. She has normal reflexes.  Skin: Skin is warm and dry.  Psychiatric: She has a normal mood and affect. Her behavior is normal. Judgment and thought content normal.  Nursing note and vitals reviewed.   BP (!) 138/92 (BP Location: Left Arm, Patient Position: Sitting, Cuff Size: Large)   Pulse 80   Temp 98.4 F (36.9 C) (Oral)   Ht 5\' 4"  (1.626 m)   Wt 249 lb (112.9 kg)   LMP 10/26/2018   SpO2 100%   BMI 42.74 kg/m  Wt Readings from Last 3 Encounters:  11/01/18 249 lb (112.9 kg)  06/24/18 244 lb (110.7 kg)  05/24/18 249  lb (112.9 kg)     Health Maintenance Due  Topic Date Due  . HIV Screening  05/20/1993    There are no preventive care reminders to display for this patient.  Lab Results  Component Value Date   TSH 1.260 11/19/2017   Lab Results  Component Value Date   WBC 4.9 05/24/2018   HGB 10.2 (L) 05/24/2018   HCT 33.6 (L) 05/24/2018   MCV 80 05/24/2018   PLT 373 05/24/2018   Lab Results  Component Value Date   NA 141 05/24/2018   K 3.9 05/24/2018   CO2 21 05/24/2018   GLUCOSE 93 05/24/2018   BUN 17 05/24/2018   CREATININE 0.74 05/24/2018   BILITOT  0.3 05/24/2018   ALKPHOS 76 05/24/2018   AST 12 05/24/2018   ALT 11 05/24/2018   PROT 7.1 05/24/2018   ALBUMIN 3.9 05/24/2018   CALCIUM 9.1 05/24/2018   ANIONGAP 8 11/13/2016   No results found for: CHOL No results found for: HDL No results found for: LDLCALC No results found for: TRIG No results found for: [facility] Lab Results  Component Value Date   HGBA1C 5.3 05/21/2017   Assessment & Plan:   1. Possible exposure to STD Swab results are pending.  - POCT urinalysis dipstick - NuSwab Vaginitis Plus (VG+)  2. Dysuria - POCT urinalysis dipstick - NuSwab Vaginitis Plus (VG+)  3. Frequency of urination - POCT urinalysis dipstick - NuSwab Vaginitis Plus (VG+)  4. Class 3 severe obesity due to excess calories with serious comorbidity and body mass index (BMI) of 40.0 to 44.9 in adult [facility](HCC) Body mass index is 42.74 kg/m. Goal BMI  is <30. Encouraged efforts to reduce weight include engaging in physical activity as tolerated with goal of 150 minutes per week. Improve dietary choices and eat a meal regimen consistent with a Mediterranean or DASH diet. Reduce simple carbohydrates. Do not skip meals and eat healthy snacks throughout the day to avoid over-eating at dinner. Set a goal weight loss that is achievable for you.  5. Urinary tract infection without hematuria, site unspecified We will initiate Macrobid today.  -  nitrofurantoin, macrocrystal-monohydrate, (MACROBID) 100 MG capsule; Take 1 capsule (100 mg total) by mouth 2 (two) times daily for 7 days.  Dispense: 14 capsule; Refill: 0  6. Follow up She will keep follow up appointment for 11/2018.   Referral Orders  No referral(s) requested today   Raliegh IpNatalie Lin Glazier,  MSN, FNP-C [facility] [address]  [place], [place] [id number] [phone]  Meds ordered this encounter  Medications  . nitrofurantoin, macrocrystal-monohydrate, (MACROBID) 100 MG capsule    Sig: Take 1 capsule (100 mg total) by mouth 2 (two) times daily for 7 days.    Dispense:  14 capsule    Refill:  0    Orders Placed This Encounter  Procedures  . NuSwab Vaginitis Plus (VG+)  . POCT urinalysis dipstick    Problem List Items Addressed This Visit    None    Visit Diagnoses    Possible exposure to STD    -  Primary   Relevant Orders   POCT urinalysis dipstick (Completed)   NuSwab Vaginitis Plus (VG+)   Dysuria       Relevant Orders   POCT urinalysis dipstick (Completed)   NuSwab Vaginitis Plus (VG+)   Frequency of urination       Relevant Orders   POCT urinalysis dipstick (Completed)   NuSwab Vaginitis Plus (VG+)   Class 3 severe obesity due to excess calories with serious comorbidity and body mass index (BMI) of 40.0 to 44.9 in adult [facility](HCC)       Urinary tract infection without hematuria, site unspecified       Relevant Medications   nitrofurantoin, macrocrystal-monohydrate, (MACROBID) 100 MG capsule   Follow up          Meds ordered this encounter  Medications  . nitrofurantoin, macrocrystal-monohydrate, (MACROBID) 100 MG capsule    Sig: Take 1 capsule (100 mg total) by mouth 2 (two) times daily for 7 days.    Dispense:  14 capsule    Refill:  0    Follow-up: No follow-ups on file.  Azzie Glatter, FNP

## 2018-11-04 ENCOUNTER — Encounter: Payer: Self-pay | Admitting: Family Medicine

## 2018-11-04 ENCOUNTER — Telehealth: Payer: Self-pay

## 2018-11-04 ENCOUNTER — Other Ambulatory Visit: Payer: Self-pay | Admitting: Family Medicine

## 2018-11-04 DIAGNOSIS — A599 Trichomoniasis, unspecified: Secondary | ICD-10-CM

## 2018-11-04 DIAGNOSIS — N760 Acute vaginitis: Principal | ICD-10-CM

## 2018-11-04 DIAGNOSIS — B9689 Other specified bacterial agents as the cause of diseases classified elsewhere: Secondary | ICD-10-CM

## 2018-11-05 ENCOUNTER — Telehealth: Payer: Self-pay

## 2018-11-05 ENCOUNTER — Other Ambulatory Visit: Payer: Self-pay | Admitting: Family Medicine

## 2018-11-05 LAB — NUSWAB VAGINITIS PLUS (VG+)
Atopobium vaginae: HIGH Score — AB
BVAB 2: HIGH Score — AB
Candida albicans, NAA: NEGATIVE
Candida glabrata, NAA: NEGATIVE
Chlamydia trachomatis, NAA: NEGATIVE
Megasphaera 1: HIGH Score — AB
Neisseria gonorrhoeae, NAA: NEGATIVE
Trich vag by NAA: POSITIVE — AB

## 2018-11-05 MED ORDER — METRONIDAZOLE 500 MG PO TABS: 500.0000 mg | ORAL_TABLET | Freq: Two times a day (BID) | ORAL | 0 refills | Status: AC

## 2018-11-05 NOTE — Telephone Encounter (Signed)
Patient notified and will pick medication  

## 2018-11-05 NOTE — Telephone Encounter (Signed)
Patient notified

## 2018-11-05 NOTE — Telephone Encounter (Signed)
-----   Message from Kallie Locks, FNP sent at 11/05/2018  2:39 PM EST ----- Regarding: "Swab Results" Teresa Arnold,   Please inform patient that we have sent Rx for Flagyl to pharmacy today. She is to discontinue Macrobid prescribed on 11/01/2018.  Swab revealed a disruption in normal flora of vagina. She is to take this medication as directed. She is to complete all medication. Most importantly, avoid Alcohol while taking Flagyl.   The most common causes of Bacterial Vaginosis and Trichomoniasis are: multiple sex partners, new sexual partner, and douching.

## 2018-11-05 NOTE — Telephone Encounter (Signed)
Patient notified and will pick up medication  

## 2018-11-20 ENCOUNTER — Other Ambulatory Visit: Payer: Self-pay

## 2018-11-20 ENCOUNTER — Ambulatory Visit (INDEPENDENT_AMBULATORY_CARE_PROVIDER_SITE_OTHER): Payer: Medicare Other | Admitting: Family Medicine

## 2018-11-20 ENCOUNTER — Encounter: Payer: Self-pay | Admitting: Family Medicine

## 2018-11-20 VITALS — BP 132/82 | HR 68 | Temp 98.0°F | Ht 64.0 in | Wt 244.0 lb

## 2018-11-20 DIAGNOSIS — E66813 Obesity, class 3: Secondary | ICD-10-CM

## 2018-11-20 DIAGNOSIS — Z6841 Body Mass Index (BMI) 40.0 and over, adult: Secondary | ICD-10-CM | POA: Diagnosis not present

## 2018-11-20 DIAGNOSIS — G35D Multiple sclerosis, unspecified: Secondary | ICD-10-CM

## 2018-11-20 DIAGNOSIS — G35 Multiple sclerosis: Secondary | ICD-10-CM | POA: Diagnosis not present

## 2018-11-20 DIAGNOSIS — Z09 Encounter for follow-up examination after completed treatment for conditions other than malignant neoplasm: Secondary | ICD-10-CM

## 2018-11-20 LAB — POCT URINALYSIS DIP (MANUAL ENTRY)
Bilirubin, UA: NEGATIVE
Blood, UA: NEGATIVE
Glucose, UA: NEGATIVE mg/dL
Ketones, POC UA: NEGATIVE mg/dL
Leukocytes, UA: NEGATIVE
Nitrite, UA: NEGATIVE
Protein Ur, POC: NEGATIVE mg/dL
Spec Grav, UA: 1.02 (ref 1.010–1.025)
Urobilinogen, UA: 0.2 E.U./dL
pH, UA: 5.5 (ref 5.0–8.0)

## 2018-11-20 NOTE — Progress Notes (Signed)
poct

## 2018-11-20 NOTE — Progress Notes (Signed)
[facility]  Established Patient Office Visit  Subjective:  Patient ID: Teresa Arnold, female    DOB: 1976/06/11  Age: 43 y.o. MRN: [id number]  CC:  Chief Complaint  Patient presents with  . Follow-up    Chronic condition    HPI Teresa Arnold is a 43 year old female who presents for Follow Up today.   Past Medical History:  Diagnosis Date  . Bronchitis   . Chronic insomnia 05/22/2017  . GERD (gastroesophageal reflux disease)   . Iron deficiency anemia 05/22/2017  . Morbid obesity (HCC) 05/22/2017  . MS (multiple sclerosis) (HCC)    Current Status: Since her last office visit, she is doing well with no complaints. She continues to follow up for MS. She denies fevers, chills, fatigue, recent infections, weight loss, and night sweats. She has not had any headaches, visual changes, dizziness, and falls. No chest pain, heart palpitations, cough and shortness of breath reported. No reports of GI problems such as nausea, vomiting, diarrhea, and constipation. She has no reports of blood in stools, dysuria and hematuria. No depression or anxiety reported. She denies pain today.   Past Surgical History:  Procedure Laterality Date  . CESAREAN SECTION     x4    Family History  Problem Relation Age of Onset  . Hypertension Mother   . Hypertension Other     Social History   Socioeconomic History  . Marital status: Single    Spouse name: Not on file  . Number of children: 4  . Years of education: 30  . Highest education level: Not on file  Occupational History  . Not on file  Social Needs  . Financial resource strain: Not on file  . Food insecurity:    Worry: Not on file    Inability: Not on file  . Transportation needs:    Medical: Not on file    Non-medical: Not on file  Tobacco Use  . Smoking status: Never Smoker  . Smokeless tobacco: Never Used  Substance and Sexual Activity  . Alcohol use: Yes   Alcohol/week: 0.0 standard drinks    Comment: occasionally  . Drug use: No  . Sexual activity: Yes    Birth control/protection: None  Lifestyle  . Physical activity:    Days per week: Not on file    Minutes per session: Not on file  . Stress: Not on file  Relationships  . Social connections:    Talks on phone: Not on file    Gets together: Not on file    Attends religious service: Not on file    Active member of club or organization: Not on file    Attends meetings of clubs or organizations: Not on file    Relationship status: Not on file  . Intimate partner violence:    Fear of current or ex partner: Not on file    Emotionally abused: Not on file    Physically abused: Not on file    Forced sexual activity: Not on file  Other Topics Concern  . Not on file  Social History Narrative   Patient is single and lives with her family.   Patient has four children.   Patient has a college education.   Patient is right-handed.   Patient does not drink any caffeine.    Outpatient Medications Prior to Visit  Medication Sig Dispense Refill  . acetaminophen (TYLENOL) 325 MG tablet Take 650 mg by  mouth every 6 (six) hours as needed for moderate pain.     . pantoprazole (PROTONIX) 40 MG tablet TAKE 1 TABLET BY MOUTH EVERY DAY 30 tablet 10  . Siponimod Fumarate (MAYZENT PO) Take by mouth.    . valACYclovir (VALTREX) 1000 MG tablet Take 1 tablet (1,000 mg total) by mouth daily. 90 tablet 2  . traZODone (DESYREL) 50 MG tablet TAKE 1 TABLET (50 MG TOTAL) AT BEDTIME BY MOUTH. (Patient not taking: Reported on 11/20/2018) 90 tablet 0   No facility-administered medications prior to visit.     Allergies  Allergen Reactions  . Other Shortness Of Breath and Itching    Tampons  . Gadolinium Derivatives Nausea And Vomiting    Pt only received half dose of 24ml before getting sick.   . Sulfa Antibiotics Itching and Rash  . Tysabri [Natalizumab] Rash    ROS Review of Systems  Constitutional:  Negative.   HENT: Negative.   Eyes: Negative.   Respiratory: Negative.   Cardiovascular: Negative.   Gastrointestinal: Negative.   Endocrine: Negative.   Genitourinary: Negative.   Musculoskeletal: Negative.   Skin: Negative.   Allergic/Immunologic: Negative.   Neurological: Positive for weakness (r/t Multiple Sclerosis).  Hematological: Negative.   Psychiatric/Behavioral: Negative.       Objective:    Physical Exam  Constitutional: She is oriented to person, place, and time. She appears well-developed and well-nourished.  HENT:  Head: Normocephalic and atraumatic.  Eyes: Conjunctivae are normal.  Neck: Normal range of motion. Neck supple.  Cardiovascular: Normal rate, regular rhythm, normal heart sounds and intact distal pulses.  Pulmonary/Chest: Effort normal and breath sounds normal.  Abdominal: Soft. Bowel sounds are normal.  Musculoskeletal: Normal range of motion.  Neurological: She is alert and oriented to person, place, and time. She has normal reflexes.  Skin: Skin is warm and dry.  Psychiatric: She has a normal mood and affect. Her behavior is normal. Judgment and thought content normal.  Nursing note and vitals reviewed.   BP 132/82 (BP Location: Right Arm, Patient Position: Sitting, Cuff Size: Large)   Pulse 68   Temp 98 F (36.7 C) (Oral)   Ht 5\' 4"  (1.626 m)   Wt 244 lb (110.7 kg)   LMP 10/26/2018   SpO2 100%   BMI 41.88 kg/m  Wt Readings from Last 3 Encounters:  11/20/18 244 lb (110.7 kg)  11/01/18 249 lb (112.9 kg)  06/24/18 244 lb (110.7 kg)     Health Maintenance Due  Topic Date Due  . HIV Screening  05/20/1993    There are no preventive care reminders to display for this patient.  Lab Results  Component Value Date   TSH 1.260 11/19/2017   Lab Results  Component Value Date   WBC 4.9 05/24/2018   HGB 10.2 (L) 05/24/2018   HCT 33.6 (L) 05/24/2018   MCV 80 05/24/2018   PLT 373 05/24/2018   Lab Results  Component Value Date   NA  141 05/24/2018   K 3.9 05/24/2018   CO2 21 05/24/2018   GLUCOSE 93 05/24/2018   BUN 17 05/24/2018   CREATININE 0.74 05/24/2018   BILITOT 0.3 05/24/2018   ALKPHOS 76 05/24/2018   AST 12 05/24/2018   ALT 11 05/24/2018   PROT 7.1 05/24/2018   ALBUMIN 3.9 05/24/2018   CALCIUM 9.1 05/24/2018   ANIONGAP 8 11/13/2016   No results found for: CHOL No results found for: HDL No results found for: LDLCALC No results found for: TRIG No  results found for: [facility] Lab Results  Component Value Date   HGBA1C 5.3 05/21/2017   Assessment & Plan:   1. Multiple sclerosis (HCC) Stable today. She continues to follow up with Neurology.   2. Class 3 severe obesity due to excess calories with serious comorbidity and body mass index (BMI) of 40.0 to 44.9 in adult [facility]) Body mass index is 41.88 kg/m. Goal BMI  is <30. Encouraged efforts to reduce weight include engaging in physical activity as tolerated with goal of 150 minutes per week. Improve dietary choices and eat a meal regimen consistent with a Mediterranean or DASH diet. Reduce simple carbohydrates. Do not skip meals and eat healthy snacks throughout the day to avoid over-eating at dinner. Set a goal weight loss that is achievable for you.  3. Follow up She will follow up in 6 months. We will draw labs at next office visit.  - POCT urinalysis dipstick  No orders of the defined types were placed in this encounter.   Orders Placed This Encounter  Procedures  . POCT urinalysis dipstick     Referral Orders  No referral(s) requested today    Raliegh Ip,  MSN, FNP-C [facility] [address] [place], [place] [address] [phone]   Problem List Items Addressed This Visit      Nervous and Auditory   Multiple sclerosis (HCC) - Primary     Other   Class 3 severe obesity due to excess calories with serious comorbidity and body mass index (BMI) of 40.0 to 44.9 in adult [facility])    Other Visit  Diagnoses    Follow up       Relevant Orders   POCT urinalysis dipstick (Completed)      No orders of the defined types were placed in this encounter.   Follow-up: Return in about 6 months (around 05/23/2019).    Kallie Locks, FNP

## 2018-12-02 ENCOUNTER — Other Ambulatory Visit: Payer: Self-pay

## 2018-12-02 MED ORDER — SIPONIMOD FUMARATE 2 MG PO TABS: 1.0000 | ORAL_TABLET | Freq: Every day | ORAL | 2 refills | Status: DC

## 2018-12-02 NOTE — Telephone Encounter (Signed)
Patient notified 90 day supply of mayzent 2 mg 1 tab daily has been submitted to [facility].

## 2019-01-07 ENCOUNTER — Ambulatory Visit: Payer: Medicare Other | Admitting: Neurology

## 2019-01-09 ENCOUNTER — Ambulatory Visit (INDEPENDENT_AMBULATORY_CARE_PROVIDER_SITE_OTHER): Payer: Medicare Other | Admitting: Neurology

## 2019-01-09 ENCOUNTER — Other Ambulatory Visit: Payer: Self-pay

## 2019-01-09 ENCOUNTER — Encounter: Payer: Self-pay | Admitting: Neurology

## 2019-01-09 DIAGNOSIS — G35 Multiple sclerosis: Secondary | ICD-10-CM | POA: Diagnosis not present

## 2019-01-09 NOTE — Progress Notes (Signed)
Virtual Visit via Video Note  I connected with [name] on 01/09/19 at  1:15 PM EDT by a video enabled telemedicine application and verified that I am speaking with the correct person using two identifiers.  Location: Patient: Home Provider: Her place of residence     I discussed the limitations of evaluation and management by telemedicine and the availability of in person appointments. The patient expressed understanding and agreed to proceed.  History of Present Illness: 01/09/2019 SS: Ms. Hennecke is a 43 year old African-American female with history of multiple sclerosis.  She is currently taking Mayzent 2 mg tablet once daily.  While on the medication she had developed daily headaches.  She has elected to not go on medication for the headaches.  She had MRI of the brain and cervical spine in June 2019, did not show any definite new lesions in the brain, possible new lesions from 2012 on the spinal cord.  In May 2019 she suffered a MS exacerbation while on Rebif, her symptoms included bilateral lower extremity numbness, gait instability.  In May 2019 her JC viral antibody was positive, index of 0.85. She has tried and failed Tysabri in the past.  She reports she is doing well.  She denies any new neurological symptoms.  She denies any new numbness or weakness in her arms or legs.  She denies any changes in her bowels or bladder.  She denies any visual changes.  She is no longer having any headaches, she may have had 1 headache since her last office visit.  She reports in October 2019, she did have a fall, fractured her left ankle, had to have screws placed. Since then she occasionally feels off balance when walking. Her last eye exam was about a year ago. She reports she sleeps well at night and has a fair appetite.   05/24/2018 Dr. Anne Hahn: Ms. Heilig is a 43 year old right-handed black female with a history of multiple sclerosis and morbid obesity.  The patient has been switched to  Mayzent, she has developed headaches on the medication that are daily in nature.  She takes Tylenol in the morning which seems to help.  The patient indicates that the headaches are not severe.  Otherwise, she has no side effects on the drug.  She has not had any new symptoms referable to her multiple sclerosis, she denies any changes in vision, balance, numbness or weakness, or any bowel or bladder control issues.  The patient returns for an evaluation.  She takes trazodone at night for sleep.   Observations/Objective: Alert, answers questions appropriately, speech is clear and concise, facial symmetry noted, symmetric eye movements, no arm drift, gait is intact  Assessment and Plan: 1. Multiple Sclerosis   Overall she appears to be doing quite well.  She will continue taking Mayzent daily.  She is tolerating the medication well.  I will check basic lab work today. I reviewed her previous primary care office visits, her BP was within normal limits in March 2020 was 132/82.  I encouraged her to get a yearly eye exam.  Headaches and hypertension can be side effects of Mayzent.  Fortunately, she has not had any more headaches.  She will follow-up in the office in 6 months or sooner if needed.  Follow Up Instructions: 6 months    I discussed the assessment and treatment plan with the patient. The patient was provided an opportunity to ask questions and all were answered. The patient agreed with the plan  and demonstrated an understanding of the instructions.   The patient was advised to call back or seek an in-person evaluation if the symptoms worsen or if the condition fails to improve as anticipated.  I provided 25 minutes of non-face-to-face time during this encounter.  Otila Kluver, DNP  [facility] [address] [place], [place] [address] [phone]

## 2019-01-09 NOTE — Progress Notes (Signed)
I have read the note, and I agree with the clinical assessment and plan.  Charles K Willis   

## 2019-01-16 ENCOUNTER — Other Ambulatory Visit: Payer: Self-pay

## 2019-01-16 ENCOUNTER — Other Ambulatory Visit (INDEPENDENT_AMBULATORY_CARE_PROVIDER_SITE_OTHER): Payer: Self-pay

## 2019-01-16 DIAGNOSIS — G35 Multiple sclerosis: Secondary | ICD-10-CM | POA: Diagnosis not present

## 2019-01-16 DIAGNOSIS — Z0289 Encounter for other administrative examinations: Secondary | ICD-10-CM

## 2019-01-17 LAB — CBC WITH DIFFERENTIAL/PLATELET
Basophils Absolute: 0 10*3/uL (ref 0.0–0.2)
Basos: 0 %
EOS (ABSOLUTE): 0.1 10*3/uL (ref 0.0–0.4)
Eos: 1 %
Hematocrit: 36 % (ref 34.0–46.6)
Hemoglobin: 11.5 g/dL (ref 11.1–15.9)
Immature Grans (Abs): 0.1 10*3/uL (ref 0.0–0.1)
Immature Granulocytes: 1 %
Lymphocytes Absolute: 0.3 10*3/uL — ABNORMAL LOW (ref 0.7–3.1)
Lymphs: 6 %
MCH: 25.5 pg — ABNORMAL LOW (ref 26.6–33.0)
MCHC: 31.9 g/dL (ref 31.5–35.7)
MCV: 80 fL (ref 79–97)
Monocytes Absolute: 0.8 10*3/uL (ref 0.1–0.9)
Monocytes: 17 %
Neutrophils Absolute: 3.4 10*3/uL (ref 1.4–7.0)
Neutrophils: 75 %
Platelets: 358 10*3/uL (ref 150–450)
RBC: 4.51 x10E6/uL (ref 3.77–5.28)
RDW: 14.4 % (ref 11.7–15.4)
WBC: 4.6 10*3/uL (ref 3.4–10.8)

## 2019-01-17 LAB — COMPREHENSIVE METABOLIC PANEL
ALT: 13 IU/L (ref 0–32)
AST: 16 IU/L (ref 0–40)
Albumin/Globulin Ratio: 1.3 (ref 1.2–2.2)
Albumin: 3.9 g/dL (ref 3.8–4.8)
Alkaline Phosphatase: 91 IU/L (ref 39–117)
BUN/Creatinine Ratio: 24 — ABNORMAL HIGH (ref 9–23)
BUN: 16 mg/dL (ref 6–24)
Bilirubin Total: 0.2 mg/dL (ref 0.0–1.2)
CO2: 21 mmol/L (ref 20–29)
Calcium: 9 mg/dL (ref 8.7–10.2)
Chloride: 104 mmol/L (ref 96–106)
Creatinine, Ser: 0.68 mg/dL (ref 0.57–1.00)
GFR calc Af Amer: 127 mL/min/{1.73_m2} (ref >59)
GFR calc non Af Amer: 110 mL/min/{1.73_m2} (ref >59)
Globulin, Total: 3 g/dL (ref 1.5–4.5)
Glucose: 84 mg/dL (ref 65–99)
Potassium: 4.4 mmol/L (ref 3.5–5.2)
Sodium: 138 mmol/L (ref 134–144)
Total Protein: 6.9 g/dL (ref 6.0–8.5)

## 2019-01-20 ENCOUNTER — Telehealth: Payer: Self-pay | Admitting: Neurology

## 2019-01-20 NOTE — Telephone Encounter (Signed)
Please call the patient and let her know that her lab work was unremarkable. Her absolute lymphocyte count was 0.3, this is okay on Mayzent, would not want less than 0.2.

## 2019-01-21 NOTE — Telephone Encounter (Signed)
Unable to get in contact with the patient. I left a voicemail letting her know what her lab results were. Office number was provided in case she has any additional concerns.

## 2019-01-30 ENCOUNTER — Ambulatory Visit: Payer: Medicare Other | Admitting: Adult Health

## 2019-01-30 ENCOUNTER — Ambulatory Visit: Payer: Medicare Other | Admitting: Neurology

## 2019-01-30 ENCOUNTER — Telehealth: Payer: Self-pay

## 2019-01-30 NOTE — Telephone Encounter (Signed)
Received a approval for Rebif. It was approved from 10/31/2018-01/30/2020. Called patient to see which pharmacy she would like the approval letter to go to. She stated that she no longer takes Rebif. I will keep a copy of the approval letter

## 2019-01-30 NOTE — Telephone Encounter (Signed)
Pending approval for Rebif Key: [id number] PA Case ID: [id number]  I will update once a decision has been made.

## 2019-03-10 ENCOUNTER — Telehealth: Payer: Self-pay | Admitting: Neurology

## 2019-03-10 NOTE — Telephone Encounter (Signed)
Teresa Arnold from [facility] program called stating that the PA for Siponimod Fumarate (MAYZENT) 2 MG TABS has expired and that she has sent in a new request to Cover my Meds and has also faxed one over to the office. Please advise.

## 2019-03-10 NOTE — Telephone Encounter (Signed)
Initiated KEY on [place].  Tried rebif and tysabri.  Been on mayzent since 03/2018.

## 2019-03-11 NOTE — Telephone Encounter (Addendum)
Received fax pertaining to [place] from [place].  Approval for nonformulary 12-10-18 thru 03-09-20.  ID [id number]. [phone].  [facility] [phone].  Fax confirmation received [phone] Mayzent.

## 2019-05-23 ENCOUNTER — Encounter: Payer: Self-pay | Admitting: Family Medicine

## 2019-05-23 ENCOUNTER — Other Ambulatory Visit: Payer: Self-pay

## 2019-05-23 ENCOUNTER — Ambulatory Visit (INDEPENDENT_AMBULATORY_CARE_PROVIDER_SITE_OTHER): Payer: Medicare Other | Admitting: Family Medicine

## 2019-05-23 VITALS — BP 118/70 | HR 62 | Temp 97.9°F | Ht 64.0 in | Wt 219.2 lb

## 2019-05-23 DIAGNOSIS — G35 Multiple sclerosis: Secondary | ICD-10-CM

## 2019-05-23 DIAGNOSIS — Z09 Encounter for follow-up examination after completed treatment for conditions other than malignant neoplasm: Secondary | ICD-10-CM

## 2019-05-23 DIAGNOSIS — Z6837 Body mass index (BMI) 37.0-37.9, adult: Secondary | ICD-10-CM

## 2019-05-23 DIAGNOSIS — E66812 Obesity, class 2: Secondary | ICD-10-CM

## 2019-05-23 DIAGNOSIS — R8290 Unspecified abnormal findings in urine: Secondary | ICD-10-CM

## 2019-05-23 DIAGNOSIS — G35D Multiple sclerosis, unspecified: Secondary | ICD-10-CM

## 2019-05-23 LAB — POCT URINALYSIS DIPSTICK
Bilirubin, UA: NEGATIVE
Glucose, UA: NEGATIVE
Protein, UA: POSITIVE — AB
Spec Grav, UA: >=1.03 — AB (ref 1.010–1.025)
Urobilinogen, UA: 1 E.U./dL
pH, UA: 5 (ref 5.0–8.0)

## 2019-05-23 NOTE — Progress Notes (Signed)
[facility]  Established Patient Office Visit  Subjective:  Patient ID: Teresa Arnold, female    DOB: 04-07-1976  Age: 43 y.o. MRN: [id number]  CC:  Chief Complaint  Patient presents with  . Follow-up    6 month follow up , no other concerns     HPI Teresa Arnold is a 43 year old female who presents for Follow Up today.   Past Medical History:  Diagnosis Date  . Bronchitis   . Chronic insomnia 05/22/2017  . GERD (gastroesophageal reflux disease)   . Iron deficiency anemia 05/22/2017  . Morbid obesity (HCC) 05/22/2017  . MS (multiple sclerosis) (HCC)    Current Status: Since her last office visit, she is doing well with no complaints. She continues to follow up with Neurologist for MS. She denies fevers, chills, fatigue, recent infections, weight loss, and night sweats. She has not had any headaches, visual changes, dizziness, and falls. No chest pain, heart palpitations, cough and shortness of breath reported. No reports of GI problems such as nausea, vomiting, diarrhea, and constipation. She has no reports of blood in stools, dysuria and hematuria. No depression or anxiety, and denies suicidal ideations, homicidal ideations, or auditory hallucinations. She denies pain today.   Past Surgical History:  Procedure Laterality Date  . CESAREAN SECTION     x4    Family History  Problem Relation Age of Onset  . Hypertension Mother   . Hypertension Other     Social History   Socioeconomic History  . Marital status: Single    Spouse name: Not on file  . Number of children: 4  . Years of education: 60  . Highest education level: Not on file  Occupational History  . Not on file  Social Needs  . Financial resource strain: Not on file  . Food insecurity    Worry: Not on file    Inability: Not on file  . Transportation needs    Medical: Not on file    Non-medical: Not on file  Tobacco Use  . Smoking status: Never  Smoker  . Smokeless tobacco: Never Used  Substance and Sexual Activity  . Alcohol use: Yes    Alcohol/week: 0.0 standard drinks    Comment: occasionally  . Drug use: No  . Sexual activity: Yes    Birth control/protection: None  Lifestyle  . Physical activity    Days per week: Not on file    Minutes per session: Not on file  . Stress: Not on file  Relationships  . Social Musician on phone: Not on file    Gets together: Not on file    Attends religious service: Not on file    Active member of club or organization: Not on file    Attends meetings of clubs or organizations: Not on file    Relationship status: Not on file  . Intimate partner violence    Fear of current or ex partner: Not on file    Emotionally abused: Not on file    Physically abused: Not on file    Forced sexual activity: Not on file  Other Topics Concern  . Not on file  Social History Narrative   Patient is single and lives with her family.   Patient has four children.   Patient has a college education.   Patient is right-handed.   Patient does not drink any caffeine.    Outpatient Medications  Prior to Visit  Medication Sig Dispense Refill  . acetaminophen (TYLENOL) 325 MG tablet Take 650 mg by mouth every 6 (six) hours as needed for moderate pain.     . pantoprazole (PROTONIX) 40 MG tablet TAKE 1 TABLET BY MOUTH EVERY DAY 30 tablet 10  . traZODone (DESYREL) 50 MG tablet TAKE 1 TABLET (50 MG TOTAL) AT BEDTIME BY MOUTH. 90 tablet 0  . valACYclovir (VALTREX) 1000 MG tablet Take 1 tablet (1,000 mg total) by mouth daily. 90 tablet 2  . Siponimod Fumarate (MAYZENT) 2 MG TABS Take 1 tablet by mouth daily. 90 tablet 2   No facility-administered medications prior to visit.     Allergies  Allergen Reactions  . Other Shortness Of Breath and Itching    Tampons  . Gadolinium Derivatives Nausea And Vomiting    Pt only received half dose of 10ml before getting sick.   . Sulfa Antibiotics Itching and  Rash  . Tysabri [Natalizumab] Rash    ROS Review of Systems  Constitutional: Negative.   HENT: Negative.   Eyes: Negative.   Respiratory: Negative.   Cardiovascular: Negative.   Gastrointestinal: Negative.   Endocrine: Negative.   Genitourinary: Negative.   Musculoskeletal: Negative.   Skin: Negative.   Allergic/Immunologic: Negative.   Neurological: Positive for dizziness (occasional ), weakness (occasional ) and headaches (occasional ).  Hematological: Negative.   Psychiatric/Behavioral: Negative.    Objective:    Physical Exam  Constitutional: She is oriented to person, place, and time. She appears well-developed and well-nourished.  HENT:  Head: Normocephalic and atraumatic.  Eyes: Conjunctivae are normal.  Neck: Normal range of motion. Neck supple.  Cardiovascular: Normal rate, regular rhythm, normal heart sounds and intact distal pulses.  Pulmonary/Chest: Effort normal and breath sounds normal.  Abdominal: Soft. Bowel sounds are normal.  Musculoskeletal: Normal range of motion.  Neurological: She is alert and oriented to person, place, and time. She has normal reflexes.  Skin: Skin is warm and dry.  Psychiatric: She has a normal mood and affect. Her behavior is normal. Judgment and thought content normal.  Nursing note and vitals reviewed.   BP 118/70 (BP Location: Left Arm, Patient Position: Sitting, Cuff Size: Large)   Pulse 62   Temp 97.9 F (36.6 C) (Oral)   Ht 5\' 4"  (1.626 m)   Wt 219 lb 3.2 oz (99.4 kg)   LMP 05/09/2019   SpO2 100%   BMI 37.63 kg/m  Wt Readings from Last 3 Encounters:  05/23/19 219 lb 3.2 oz (99.4 kg)  11/20/18 244 lb (110.7 kg)  11/01/18 249 lb (112.9 kg)     Health Maintenance Due  Topic Date Due  . HIV Screening  05/20/1993  . INFLUENZA VACCINE  04/12/2019    There are no preventive care reminders to display for this patient.  Lab Results  Component Value Date   TSH 1.260 11/19/2017   Lab Results  Component Value  Date   WBC 4.6 01/16/2019   HGB 11.5 01/16/2019   HCT 36.0 01/16/2019   MCV 80 01/16/2019   PLT 358 01/16/2019   Lab Results  Component Value Date   NA 138 01/16/2019   K 4.4 01/16/2019   CO2 21 01/16/2019   GLUCOSE 84 01/16/2019   BUN 16 01/16/2019   CREATININE 0.68 01/16/2019   BILITOT 0.2 01/16/2019   ALKPHOS 91 01/16/2019   AST 16 01/16/2019   ALT 13 01/16/2019   PROT 6.9 01/16/2019   ALBUMIN 3.9 01/16/2019   CALCIUM  9.0 01/16/2019   ANIONGAP 8 11/13/2016   No results found for: CHOL No results found for: HDL No results found for: LDLCALC No results found for: TRIG No results found for: [facility] Lab Results  Component Value Date   HGBA1C 5.3 05/21/2017   Assessment & Plan:   1. Multiple sclerosis (HCC) Stable. She continues follow up with Neurologist.   2. Class 2 severe obesity due to excess calories with serious comorbidity and body mass in She has been doing well towards her weight loss goal. She has a 30 lb weight loss in 7 months.  Body mass index is 37.63 kg/m.  Goal BMI  is <30. Encouraged efforts to reduce weight include engaging in physical activity as tolerated with goal of 150 minutes per week. Improve dietary choices and eat a meal regimen consistent with a Mediterranean or DASH diet. Reduce simple carbohydrates. Do not skip meals and eat healthy snacks throughout the day to avoid over-eating at dinner. She will continue to set goals towards her desired weight loss that is achievable for you.   3. Abnormal urine - POCT Urinalysis Dipstick  4. Abnormal urinalysis Results are pening.  - Urine Culture  5. Follow up She will follow up in 6 months.   No orders of the defined types were placed in this encounter.   Orders Placed This Encounter  Procedures  . Urine Culture  . POCT Urinalysis Dipstick    Referral Orders  No referral(s) requested today    Kathe Becton,  MSN, FNP-BC [place], [place] [address] [phone]- fax   Problem List Items Addressed This Visit      Nervous and Auditory   Multiple sclerosis ([place]) - Primary    Other Visit Diagnoses    Class 2 severe obesity due to excess calories with serious comorbidity and body mass index (BMI) of 37.0 to 37.9 in adult [facility])       Abnormal urine       Relevant Orders   POCT Urinalysis Dipstick (Completed)   Abnormal urinalysis       Relevant Orders   Urine Culture   Follow up          No orders of the defined types were placed in this encounter.   Follow-up: Return in about 6 months (around 11/20/2019).    Azzie Glatter, FNP

## 2019-05-25 LAB — URINE CULTURE

## 2019-05-26 ENCOUNTER — Other Ambulatory Visit: Payer: Self-pay | Admitting: Family Medicine

## 2019-05-26 DIAGNOSIS — B9620 Unspecified Escherichia coli [E. coli] as the cause of diseases classified elsewhere: Secondary | ICD-10-CM

## 2019-05-26 MED ORDER — AMOXICILLIN-POT CLAVULANATE 875-125 MG PO TABS: 1.0000 | ORAL_TABLET | Freq: Two times a day (BID) | ORAL | 0 refills | Status: AC

## 2019-07-11 ENCOUNTER — Ambulatory Visit: Payer: Self-pay | Admitting: Neurology

## 2019-07-16 NOTE — Progress Notes (Signed)
PATIENT: Teresa Arnold DOB: 10-10-1975  REASON FOR VISIT: follow up HISTORY FROM: patient  HISTORY OF PRESENT ILLNESS: Today 07/17/19  Teresa Arnold is a 43 year old female with history of multiple sclerosis.  She remains on Mayzent 2 mg tablet daily.  Her last MRI of the brain and cervical spine in June 2019, did not show any definite new lesions in the brain, possible new lesions from 2012 on the spinal cord.  She developed daily headaches when starting Mayzent.  She decided not to start daily medication, because the headaches improved.  Today, she says for the last month she has had a daily headache, it is not severe.  She will take Tylenol with relief.  She indicates her blood pressure has been under good control.  She says most bothersome with her chronic MS is her issues with balance and right leg weakness.  She has not had any falls.  She does report some urinary urgency.  She denies any changes to the vision.  Her gait is still somewhat impaired from her injury in October 2019 where she fractured her left ankle, had to have screws placed.  She is on disability, she has 3 children.  She will take trazodone as needed at bedtime for sleep.  In the last year she has lost 40 pounds.  She presents today for follow-up unaccompanied.  HISTORY 01/09/2019 SS: Teresa Arnold is a 43 year old African-American female with history of multiple sclerosis.  She is currently taking Mayzent 2 mg tablet once daily.  While on the medication she had developed daily headaches.  She has elected to not go on medication for the headaches.  She had MRI of the brain and cervical spine in June 2019, did not show any definite new lesions in the brain, possible new lesions from 2012 on the spinal cord.  In May 2019 she suffered a MS exacerbation while on Rebif, her symptoms included bilateral lower extremity numbness, gait instability.  In May 2019 her JC viral antibody was positive, index of 0.85. She has tried and  failed Tysabri in the past.  She reports she is doing well.  She denies any new neurological symptoms.  She denies any new numbness or weakness in her arms or legs.  She denies any changes in her bowels or bladder.  She denies any visual changes.  She is no longer having any headaches, she may have had 1 headache since her last office visit.  She reports in October 2019, she did have a fall, fractured her left ankle, had to have screws placed. Since then she occasionally feels off balance when walking. Her last eye exam was about a year ago. She reports she sleeps well at night and has a fair appetite.   REVIEW OF SYSTEMS: Out of a complete 14 system review of symptoms, the patient complains only of the following symptoms, and all other reviewed systems are negative.  Dizziness, headache  ALLERGIES: Allergies  Allergen Reactions  . Other Shortness Of Breath and Itching    Tampons  . Gadolinium Derivatives Nausea And Vomiting    Pt only received half dose of 58ml before getting sick.   . Sulfa Antibiotics Itching and Rash  . Tysabri [Natalizumab] Rash    HOME MEDICATIONS: Outpatient Medications Prior to Visit  Medication Sig Dispense Refill  . acetaminophen (TYLENOL) 325 MG tablet Take 650 mg by mouth every 6 (six) hours as needed for moderate pain.     . pantoprazole (PROTONIX) 40 MG  tablet TAKE 1 TABLET BY MOUTH EVERY DAY 30 tablet 10  . traZODone (DESYREL) 50 MG tablet TAKE 1 TABLET (50 MG TOTAL) AT BEDTIME BY MOUTH. 90 tablet 0  . valACYclovir (VALTREX) 1000 MG tablet Take 1 tablet (1,000 mg total) by mouth daily. 90 tablet 2  . Siponimod Fumarate (MAYZENT) 2 MG TABS Take 1 tablet by mouth daily. 90 tablet 2   No facility-administered medications prior to visit.     PAST MEDICAL HISTORY: Past Medical History:  Diagnosis Date  . Bronchitis   . Chronic insomnia 05/22/2017  . GERD (gastroesophageal reflux disease)   . Iron deficiency anemia 05/22/2017  . Morbid obesity (HCC)  05/22/2017  . MS (multiple sclerosis) (HCC)     PAST SURGICAL HISTORY: Past Surgical History:  Procedure Laterality Date  . CESAREAN SECTION     x4    FAMILY HISTORY: Family History  Problem Relation Age of Onset  . Hypertension Mother   . Hypertension Other     SOCIAL HISTORY: Social History   Socioeconomic History  . Marital status: Single    Spouse name: Not on file  . Number of children: 4  . Years of education: 48  . Highest education level: Not on file  Occupational History  . Not on file  Social Needs  . Financial resource strain: Not on file  . Food insecurity    Worry: Not on file    Inability: Not on file  . Transportation needs    Medical: Not on file    Non-medical: Not on file  Tobacco Use  . Smoking status: Never Smoker  . Smokeless tobacco: Never Used  Substance and Sexual Activity  . Alcohol use: Yes    Alcohol/week: 0.0 standard drinks    Comment: occasionally  . Drug use: No  . Sexual activity: Yes    Birth control/protection: None  Lifestyle  . Physical activity    Days per week: Not on file    Minutes per session: Not on file  . Stress: Not on file  Relationships  . Social Musician on phone: Not on file    Gets together: Not on file    Attends religious service: Not on file    Active member of club or organization: Not on file    Attends meetings of clubs or organizations: Not on file    Relationship status: Not on file  . Intimate partner violence    Fear of current or ex partner: Not on file    Emotionally abused: Not on file    Physically abused: Not on file    Forced sexual activity: Not on file  Other Topics Concern  . Not on file  Social History Narrative   Patient is single and lives with her family.   Patient has four children.   Patient has a college education.   Patient is right-handed.   Patient does not drink any caffeine.    PHYSICAL EXAM  Vitals:   07/17/19 1338  BP: 119/76  Pulse: 69  Temp:  97.9 F (36.6 C)  Weight: 208 lb 12.8 oz (94.7 kg)  Height: 5' 4.5" (1.638 m)   Body mass index is 35.29 kg/m.  Generalized: Well developed, in no acute distress   Neurological examination  Mentation: Alert oriented to time, place, history taking. Follows all commands speech and language fluent Cranial nerve II-XII: Pupils were equal round reactive to light. Extraocular movements were full, visual field were full on confrontational  test. Facial sensation and strength were normal. Head turning and shoulder shrug  were normal and symmetric. Motor: The motor testing reveals 5 over 5 strength of extremities, 4/5 right extremity, good symmetric motor tone is noted throughout. Strong dorsiflexion bilaterally. Sensory: Sensory testing is intact to soft touch on all 4 extremities. No evidence of extinction is noted.  Coordination: Cerebellar testing reveals good finger-nose-finger and heel-to-shin bilaterally.  Gait and station: Limping type gait on the left from prior surgery, tandem gait is unsteady, with Romberg tends to lean to the left Reflexes: Deep tendon reflexes are symmetric and normal bilaterally.   DIAGNOSTIC DATA (LABS, IMAGING, TESTING) - I reviewed patient records, labs, notes, testing and imaging myself where available.  Lab Results  Component Value Date   WBC 4.6 01/16/2019   HGB 11.5 01/16/2019   HCT 36.0 01/16/2019   MCV 80 01/16/2019   PLT 358 01/16/2019      Component Value Date/Time   NA 138 01/16/2019 1451   K 4.4 01/16/2019 1451   CL 104 01/16/2019 1451   CO2 21 01/16/2019 1451   GLUCOSE 84 01/16/2019 1451   GLUCOSE 86 05/21/2017 1142   BUN 16 01/16/2019 1451   CREATININE 0.68 01/16/2019 1451   CREATININE 0.67 05/21/2017 1142   CALCIUM 9.0 01/16/2019 1451   PROT 6.9 01/16/2019 1451   ALBUMIN 3.9 01/16/2019 1451   AST 16 01/16/2019 1451   ALT 13 01/16/2019 1451   ALKPHOS 91 01/16/2019 1451   BILITOT 0.2 01/16/2019 1451   GFRNONAA 110 01/16/2019 1451    GFRNONAA 111 05/21/2017 1142   GFRAA 127 01/16/2019 1451   GFRAA 128 05/21/2017 1142   No results found for: CHOL, HDL, LDLCALC, LDLDIRECT, TRIG, CHOLHDL Lab Results  Component Value Date   HGBA1C 5.3 05/21/2017   No results found for: VITAMINB12 Lab Results  Component Value Date   TSH 1.260 11/19/2017      ASSESSMENT AND PLAN 43 y.o. year old female  has a past medical history of Bronchitis, Chronic insomnia (05/22/2017), GERD (gastroesophageal reflux disease), Iron deficiency anemia (05/22/2017), Morbid obesity (HCC) (05/22/2017), and MS (multiple sclerosis) (HCC). here with:  1.  Multiple sclerosis 2.  Headache  She has continued to report headache intermittently since starting Mayzent.  For the last month she complains of a daily mild headache, completely relieved with Tylenol.  She does not wish to start daily medications for headache at this time.  If the headache continues or worsens, she is to let me know.  She does not wish to proceed with MRI of the brain or cervical spine at this visit, she would like to hold off till next visit.  MRI of the brain and cervical spine in June 2019, did not show any definite new lesions in the brain, possible new lesions from 2012, on the spinal cord. In May 2019, she suffered an exacerbation while on Rebif. She is not having any new symptoms related to her MS.  In fact, she has done quite well to lose 40 pounds since last seen.  I will check routine lab work today while on [employer].  She will remain on Mayzent 2 mg tablet daily.  She takes trazodone 50 mg at bedtime, as needed for sleep, she does not need a refill.  She will follow-up in 6 months or sooner if needed.  I did advise if her symptoms worsen or she develops any new symptoms she should let us know.  I spent 25 minutes with the patient.  50% of this time was spent discussing her plan of care.   Butler Denmark, AGNP-C, DNP 07/17/2019, 2:30 PM [facility] [address] [place], [place] [address] [phone]

## 2019-07-17 ENCOUNTER — Ambulatory Visit (INDEPENDENT_AMBULATORY_CARE_PROVIDER_SITE_OTHER): Payer: Medicare Other | Admitting: Neurology

## 2019-07-17 ENCOUNTER — Encounter: Payer: Self-pay | Admitting: Neurology

## 2019-07-17 ENCOUNTER — Other Ambulatory Visit: Payer: Self-pay

## 2019-07-17 VITALS — BP 119/76 | HR 69 | Temp 97.9°F | Ht 64.5 in | Wt 208.8 lb

## 2019-07-17 DIAGNOSIS — G35 Multiple sclerosis: Secondary | ICD-10-CM

## 2019-07-17 DIAGNOSIS — R519 Headache, unspecified: Secondary | ICD-10-CM | POA: Diagnosis not present

## 2019-07-17 MED ORDER — MAYZENT 2 MG PO TABS: 1.0000 | ORAL_TABLET | Freq: Every day | ORAL | 2 refills | Status: DC

## 2019-07-17 NOTE — Patient Instructions (Signed)
Please monitor your symptoms of headache, if worsens please let me know. I will check lab work. Let's get MRI of the brain and cervical spine.

## 2019-07-18 LAB — CBC WITH DIFFERENTIAL/PLATELET
Basophils Absolute: 0 10*3/uL (ref 0.0–0.2)
Basos: 0 %
EOS (ABSOLUTE): 0 10*3/uL (ref 0.0–0.4)
Eos: 1 %
Hematocrit: 33.6 % — ABNORMAL LOW (ref 34.0–46.6)
Hemoglobin: 10.5 g/dL — ABNORMAL LOW (ref 11.1–15.9)
Immature Grans (Abs): 0 10*3/uL (ref 0.0–0.1)
Immature Granulocytes: 1 %
Lymphocytes Absolute: 0.3 10*3/uL — ABNORMAL LOW (ref 0.7–3.1)
Lymphs: 8 %
MCH: 25.9 pg — ABNORMAL LOW (ref 26.6–33.0)
MCHC: 31.3 g/dL — ABNORMAL LOW (ref 31.5–35.7)
MCV: 83 fL (ref 79–97)
Monocytes Absolute: 0.4 10*3/uL (ref 0.1–0.9)
Monocytes: 13 %
Neutrophils Absolute: 2.6 10*3/uL (ref 1.4–7.0)
Neutrophils: 77 %
Platelets: 350 10*3/uL (ref 150–450)
RBC: 4.05 x10E6/uL (ref 3.77–5.28)
RDW: 12.7 % (ref 11.7–15.4)
WBC: 3.4 10*3/uL (ref 3.4–10.8)

## 2019-07-18 LAB — COMPREHENSIVE METABOLIC PANEL
ALT: 13 IU/L (ref 0–32)
AST: 14 IU/L (ref 0–40)
Albumin/Globulin Ratio: 1.5 (ref 1.2–2.2)
Albumin: 4 g/dL (ref 3.8–4.8)
Alkaline Phosphatase: 74 IU/L (ref 39–117)
BUN/Creatinine Ratio: 17 (ref 9–23)
BUN: 12 mg/dL (ref 6–24)
Bilirubin Total: <0.2 mg/dL (ref 0.0–1.2)
CO2: 22 mmol/L (ref 20–29)
Calcium: 9.3 mg/dL (ref 8.7–10.2)
Chloride: 106 mmol/L (ref 96–106)
Creatinine, Ser: 0.72 mg/dL (ref 0.57–1.00)
GFR calc Af Amer: 120 mL/min/{1.73_m2} (ref >59)
GFR calc non Af Amer: 104 mL/min/{1.73_m2} (ref >59)
Globulin, Total: 2.7 g/dL (ref 1.5–4.5)
Glucose: 94 mg/dL (ref 65–99)
Potassium: 4 mmol/L (ref 3.5–5.2)
Sodium: 143 mmol/L (ref 134–144)
Total Protein: 6.7 g/dL (ref 6.0–8.5)

## 2019-07-19 NOTE — Progress Notes (Signed)
I have read the note, and I agree with the clinical assessment and plan.  Zyrion Coey K Nita Whitmire   

## 2019-07-22 ENCOUNTER — Telehealth: Payer: Self-pay | Admitting: *Deleted

## 2019-07-22 NOTE — Telephone Encounter (Signed)
Spoke with patient and informed her that her labs look stable on Mayzent. The absolute lymphocyte count is 0.3 which is stable from lab 6 months ago. Patient verbalized understanding, appreciation.

## 2019-10-22 ENCOUNTER — Telehealth: Payer: Self-pay | Admitting: Internal Medicine

## 2019-10-23 ENCOUNTER — Telehealth: Payer: Self-pay

## 2019-10-23 NOTE — Telephone Encounter (Signed)
Patient would like to be seen at the [facility], [facility] [phone], Fax [phone]. Patient c/o Eczemas on the hands.

## 2019-10-23 NOTE — Telephone Encounter (Signed)
Patient has requested a Dermatology referral to [facility] for eczemas.

## 2019-10-25 ENCOUNTER — Other Ambulatory Visit: Payer: Self-pay | Admitting: Family Medicine

## 2019-10-25 DIAGNOSIS — L309 Dermatitis, unspecified: Secondary | ICD-10-CM

## 2019-11-10 DIAGNOSIS — E538 Deficiency of other specified B group vitamins: Secondary | ICD-10-CM

## 2019-11-10 DIAGNOSIS — E559 Vitamin D deficiency, unspecified: Secondary | ICD-10-CM

## 2019-11-10 HISTORY — DX: Deficiency of other specified B group vitamins: E53.8

## 2019-11-10 HISTORY — DX: Vitamin D deficiency, unspecified: E55.9

## 2019-11-20 ENCOUNTER — Telehealth: Payer: Self-pay | Admitting: Family Medicine

## 2019-11-20 NOTE — Telephone Encounter (Signed)
Pt called and reminded of appointment 

## 2019-11-21 ENCOUNTER — Other Ambulatory Visit: Payer: Self-pay

## 2019-11-21 ENCOUNTER — Ambulatory Visit (INDEPENDENT_AMBULATORY_CARE_PROVIDER_SITE_OTHER): Payer: Medicare Other | Admitting: Family Medicine

## 2019-11-21 ENCOUNTER — Encounter: Payer: Self-pay | Admitting: Family Medicine

## 2019-11-21 VITALS — BP 113/68 | HR 60 | Temp 98.3°F | Ht 64.5 in | Wt 196.0 lb

## 2019-11-21 DIAGNOSIS — R634 Abnormal weight loss: Secondary | ICD-10-CM | POA: Diagnosis not present

## 2019-11-21 DIAGNOSIS — E538 Deficiency of other specified B group vitamins: Secondary | ICD-10-CM | POA: Diagnosis not present

## 2019-11-21 DIAGNOSIS — E559 Vitamin D deficiency, unspecified: Secondary | ICD-10-CM | POA: Diagnosis not present

## 2019-11-21 DIAGNOSIS — Z6833 Body mass index (BMI) 33.0-33.9, adult: Secondary | ICD-10-CM | POA: Diagnosis not present

## 2019-11-21 DIAGNOSIS — Z09 Encounter for follow-up examination after completed treatment for conditions other than malignant neoplasm: Secondary | ICD-10-CM | POA: Diagnosis not present

## 2019-11-21 DIAGNOSIS — E6609 Other obesity due to excess calories: Secondary | ICD-10-CM | POA: Diagnosis not present

## 2019-11-21 DIAGNOSIS — Z0000 Encounter for general adult medical examination without abnormal findings: Secondary | ICD-10-CM | POA: Diagnosis not present

## 2019-11-21 DIAGNOSIS — G35 Multiple sclerosis: Secondary | ICD-10-CM | POA: Diagnosis not present

## 2019-11-21 DIAGNOSIS — G35D Multiple sclerosis, unspecified: Secondary | ICD-10-CM

## 2019-11-21 LAB — POCT URINALYSIS DIPSTICK
Bilirubin, UA: NEGATIVE
Blood, UA: NEGATIVE
Glucose, UA: NEGATIVE
Ketones, UA: NEGATIVE
Nitrite, UA: NEGATIVE
Protein, UA: NEGATIVE
Spec Grav, UA: 1.02 (ref 1.010–1.025)
Urobilinogen, UA: 2 E.U./dL — AB
pH, UA: 6 (ref 5.0–8.0)

## 2019-11-22 LAB — CBC WITH DIFFERENTIAL/PLATELET
Basophils Absolute: 0 10*3/uL (ref 0.0–0.2)
Basos: 1 %
EOS (ABSOLUTE): 0.1 10*3/uL (ref 0.0–0.4)
Eos: 4 %
Hematocrit: 33.2 % — ABNORMAL LOW (ref 34.0–46.6)
Hemoglobin: 10.7 g/dL — ABNORMAL LOW (ref 11.1–15.9)
Immature Grans (Abs): 0 10*3/uL (ref 0.0–0.1)
Immature Granulocytes: 1 %
Lymphocytes Absolute: 0.1 10*3/uL — ABNORMAL LOW (ref 0.7–3.1)
Lymphs: 5 %
MCH: 26.2 pg — ABNORMAL LOW (ref 26.6–33.0)
MCHC: 32.2 g/dL (ref 31.5–35.7)
MCV: 81 fL (ref 79–97)
Monocytes Absolute: 0.4 10*3/uL (ref 0.1–0.9)
Monocytes: 20 %
Neutrophils Absolute: 1.5 10*3/uL (ref 1.4–7.0)
Neutrophils: 69 %
Platelets: 317 10*3/uL (ref 150–450)
RBC: 4.08 x10E6/uL (ref 3.77–5.28)
RDW: 12.2 % (ref 11.7–15.4)
WBC: 2.1 10*3/uL — CL (ref 3.4–10.8)

## 2019-11-22 LAB — COMPREHENSIVE METABOLIC PANEL
ALT: 11 IU/L (ref 0–32)
AST: 15 IU/L (ref 0–40)
Albumin/Globulin Ratio: 1.6 (ref 1.2–2.2)
Albumin: 4.2 g/dL (ref 3.8–4.8)
Alkaline Phosphatase: 66 IU/L (ref 39–117)
BUN/Creatinine Ratio: 25 — ABNORMAL HIGH (ref 9–23)
BUN: 17 mg/dL (ref 6–24)
Bilirubin Total: 0.2 mg/dL (ref 0.0–1.2)
CO2: 23 mmol/L (ref 20–29)
Calcium: 9 mg/dL (ref 8.7–10.2)
Chloride: 105 mmol/L (ref 96–106)
Creatinine, Ser: 0.69 mg/dL (ref 0.57–1.00)
GFR calc Af Amer: 125 mL/min/{1.73_m2} (ref >59)
GFR calc non Af Amer: 108 mL/min/{1.73_m2} (ref >59)
Globulin, Total: 2.7 g/dL (ref 1.5–4.5)
Glucose: 77 mg/dL (ref 65–99)
Potassium: 4.1 mmol/L (ref 3.5–5.2)
Sodium: 139 mmol/L (ref 134–144)
Total Protein: 6.9 g/dL (ref 6.0–8.5)

## 2019-11-22 LAB — LIPID PANEL
Chol/HDL Ratio: 3.1 ratio (ref 0.0–4.4)
Cholesterol, Total: 189 mg/dL (ref 100–199)
HDL: 61 mg/dL (ref >39)
LDL Chol Calc (NIH): 119 mg/dL — ABNORMAL HIGH (ref 0–99)
Triglycerides: 47 mg/dL (ref 0–149)
VLDL Cholesterol Cal: 9 mg/dL (ref 5–40)

## 2019-11-22 LAB — VITAMIN D 25 HYDROXY (VIT D DEFICIENCY, FRACTURES): Vit D, 25-Hydroxy: 6.6 ng/mL — ABNORMAL LOW (ref 30.0–100.0)

## 2019-11-22 LAB — VITAMIN B12: Vitamin B-12: 208 pg/mL — ABNORMAL LOW (ref 232–1245)

## 2019-11-22 LAB — TSH: TSH: 0.975 u[IU]/mL (ref 0.450–4.500)

## 2019-11-23 ENCOUNTER — Encounter: Payer: Self-pay | Admitting: Family Medicine

## 2019-11-23 ENCOUNTER — Other Ambulatory Visit: Payer: Self-pay | Admitting: Family Medicine

## 2019-11-23 DIAGNOSIS — E538 Deficiency of other specified B group vitamins: Secondary | ICD-10-CM

## 2019-11-23 DIAGNOSIS — E559 Vitamin D deficiency, unspecified: Secondary | ICD-10-CM | POA: Insufficient documentation

## 2019-11-23 DIAGNOSIS — R634 Abnormal weight loss: Secondary | ICD-10-CM | POA: Insufficient documentation

## 2019-11-23 MED ORDER — VITAMIN D (ERGOCALCIFEROL) 1.25 MG (50000 UNIT) PO CAPS: 50000.0000 [IU] | ORAL_CAPSULE | ORAL | 6 refills | Status: DC

## 2019-11-23 MED ORDER — VITAMIN B-12 50 MCG PO TABS: 50.0000 ug | ORAL_TABLET | Freq: Every day | ORAL | 6 refills | Status: DC

## 2019-11-23 NOTE — Progress Notes (Signed)
Patient [facility]  Established Patient Office Visit  Subjective:  Patient ID: Teresa Arnold, female    DOB: 22-Apr-1976  Age: 44 y.o. MRN: [id number]  CC:  Chief Complaint  Patient presents with  . Follow-up    HPI Teresa Arnold is a 44 year old female who presents for Follow Up today.   Past Medical History:  Diagnosis Date  . Bronchitis   . Chronic insomnia 05/22/2017  . GERD (gastroesophageal reflux disease)   . Iron deficiency anemia 05/22/2017  . Morbid obesity ([place]) 05/22/2017  . MS (multiple sclerosis) ([place])    Current Status: Since his last office visit, he is doing well with no complaints. She continues to follow up with Neurologist as needed for MS.  She has has a decrease in her weight. He denies fevers, chills, fatigue, recent infections, weight loss, and night sweats. He has not had any headaches, visual changes, dizziness, and falls. No chest pain, heart palpitations, cough and shortness of breath reported. No reports of GI problems such as nausea, vomiting, diarrhea, and constipation. He has no reports of blood in stools, dysuria and hematuria. No depression or anxiety, and denies suicidal ideations, homicidal ideations, or auditory hallucinations. He denies pain today.   Past Surgical History:  Procedure Laterality Date  . CESAREAN SECTION     x4    Family History  Problem Relation Age of Onset  . Hypertension Mother   . Hypertension Other     Social History   Socioeconomic History  . Marital status: Single    Spouse name: Not on file  . Number of children: 4  . Years of education: 88  . Highest education level: Not on file  Occupational History  . Not on file  Tobacco Use  . Smoking status: Never Smoker  . Smokeless tobacco: Never Used  Substance and Sexual Activity  . Alcohol use: Not Currently    Alcohol/week: 0.0 standard drinks    Comment: occasionally  . Drug use: No  . Sexual activity:  Yes    Birth control/protection: None  Other Topics Concern  . Not on file  Social History Narrative   Patient is single and lives with her family.   Patient has four children.   Patient has a college education.   Patient is right-handed.   Patient does not drink any caffeine.   Social Determinants of Health   Financial Resource Strain:   . Difficulty of Paying Living Expenses:   Food Insecurity:   . Worried About Charity fundraiser in the Last Year:   . Arboriculturist in the Last Year:   Transportation Needs:   . Film/video editor (Medical):   Marland Kitchen Lack of Transportation (Non-Medical):   Physical Activity:   . Days of Exercise per Week:   . Minutes of Exercise per Session:   Stress:   . Feeling of Stress :   Social Connections:   . Frequency of Communication with Friends and Family:   . Frequency of Social Gatherings with Friends and Family:   . Attends Religious Services:   . Active Member of Clubs or Organizations:   . Attends Archivist Meetings:   Marland Kitchen Marital Status:   Intimate Partner Violence:   . Fear of Current or Ex-Partner:   . Emotionally Abused:   Marland Kitchen Physically Abused:   . Sexually Abused:     Outpatient Medications Prior to Visit  Medication Sig  Dispense Refill  . acetaminophen (TYLENOL) 325 MG tablet Take 650 mg by mouth every 6 (six) hours as needed for moderate pain.     . pantoprazole (PROTONIX) 40 MG tablet TAKE 1 TABLET BY MOUTH EVERY DAY 30 tablet 10  . Siponimod Fumarate (MAYZENT) 2 MG TABS Take 1 tablet by mouth daily. 90 tablet 2  . traZODone (DESYREL) 50 MG tablet TAKE 1 TABLET (50 MG TOTAL) AT BEDTIME BY MOUTH. 90 tablet 0  . valACYclovir (VALTREX) 1000 MG tablet Take 1 tablet (1,000 mg total) by mouth daily. 90 tablet 2   No facility-administered medications prior to visit.    Allergies  Allergen Reactions  . Other Shortness Of Breath and Itching    Tampons  . Gadolinium Derivatives Nausea And Vomiting    Pt only received  half dose of 40ml before getting sick.   . Sulfa Antibiotics Itching and Rash  . Tysabri [Natalizumab] Rash    ROS Review of Systems  Constitutional: Negative.   HENT: Negative.   Eyes: Negative.   Respiratory: Negative.   Cardiovascular: Negative.   Gastrointestinal: Negative.   Endocrine: Negative.   Genitourinary: Negative.   Musculoskeletal: Negative.   Skin: Negative.   Allergic/Immunologic: Negative.   Neurological: Positive for dizziness (occasional), weakness (generalized weakness ) and headaches (occasional ).  Hematological: Negative.   Psychiatric/Behavioral: Negative.       Objective:    Physical Exam  Constitutional: She is oriented to person, place, and time. She appears well-developed and well-nourished.  HENT:  Head: Normocephalic and atraumatic.  Eyes: Conjunctivae are normal.  Cardiovascular: Normal rate and regular rhythm.  Pulmonary/Chest: Effort normal and breath sounds normal.  Abdominal: Soft. Bowel sounds are normal.  Musculoskeletal:        General: Normal range of motion.     Cervical back: Normal range of motion and neck supple.  Neurological: She is alert and oriented to person, place, and time. She has normal reflexes.  Skin: Skin is warm and dry.  Psychiatric: She has a normal mood and affect. Her behavior is normal. Judgment and thought content normal.  Nursing note and vitals reviewed.   BP 113/68   Pulse 60   Temp 98.3 F (36.8 C) (Oral)   Ht 5' 4.5" (1.638 m)   Wt 196 lb (88.9 kg)   LMP 11/11/2019   SpO2 100%   BMI 33.12 kg/m  Wt Readings from Last 3 Encounters:  11/21/19 196 lb (88.9 kg)  07/17/19 208 lb 12.8 oz (94.7 kg)  05/23/19 219 lb 3.2 oz (99.4 kg)     Health Maintenance Due  Topic Date Due  . HIV Screening  Never done    There are no preventive care reminders to display for this patient.  Lab Results  Component Value Date   TSH 0.975 11/21/2019   Lab Results  Component Value Date   WBC 2.1 (LL)  11/21/2019   HGB 10.7 (L) 11/21/2019   HCT 33.2 (L) 11/21/2019   MCV 81 11/21/2019   PLT 317 11/21/2019   Lab Results  Component Value Date   NA 139 11/21/2019   K 4.1 11/21/2019   CO2 23 11/21/2019   GLUCOSE 77 11/21/2019   BUN 17 11/21/2019   CREATININE 0.69 11/21/2019   BILITOT 0.2 11/21/2019   ALKPHOS 66 11/21/2019   AST 15 11/21/2019   ALT 11 11/21/2019   PROT 6.9 11/21/2019   ALBUMIN 4.2 11/21/2019   CALCIUM 9.0 11/21/2019   ANIONGAP 8 11/13/2016  Lab Results  Component Value Date   CHOL 189 11/21/2019   Lab Results  Component Value Date   HDL 61 11/21/2019   Lab Results  Component Value Date   LDLCALC 119 (H) 11/21/2019   Lab Results  Component Value Date   TRIG 47 11/21/2019   Lab Results  Component Value Date   CHOLHDL 3.1 11/21/2019   Lab Results  Component Value Date   HGBA1C 5.3 05/21/2017      Assessment & Plan:   1. MS (multiple sclerosis) (HCC) Stable. No signs or symptoms of distress noted or reported.  - CBC with Differential  2. Vitamin D deficiency - Vitamin D, 25-hydroxy  3. Vitamin B12 deficiency - Vitamin B12  4. Weight loss 6 lb weight loss in 4 months.   5. Class 1 obesity due to excess calories with serious comorbidity and body mass index (BMI) of 33.0 to 33.9 in adult [facility]) Body mass index is 33.12 kg/m. Goal BMI  is <30. Encouraged efforts to reduce weight include engaging in physical activity as tolerated with goal of 150 minutes per week. Improve dietary choices and eat a meal regimen consistent with a Mediterranean or DASH diet. Reduce simple carbohydrates. Do not skip meals and eat healthy snacks throughout the day to avoid over-eating at dinner. Set a goal weight loss that is achievable for you.  6. Healthcare maintenance - POCT urinalysis dipstick - Comprehensive metabolic panel - Lipid Panel - TSH  7. Follow up She will follow up in 6 months.   No orders of the defined types were placed in this  encounter.  Orders Placed This Encounter  Procedures  . CBC with Differential  . Comprehensive metabolic panel  . Lipid Panel  . TSH  . Vitamin B12  . Vitamin D, 25-hydroxy  . POCT urinalysis dipstick    Referral Orders  No referral(s) requested today    Raliegh Ip,  MSN, FNP-BC [facility] [address] [place], [place] [address] [phone] fax    Problem List Items Addressed This Visit      Other   Class 3 severe obesity due to excess calories with serious comorbidity and body mass index (BMI) of 40.0 to 44.9 in adult [facility])    Other Visit Diagnoses    MS (multiple sclerosis) (HCC)    -  Primary   Relevant Orders   CBC with Differential (Completed)   Vitamin D deficiency       Relevant Orders   Vitamin D, 25-hydroxy (Completed)   Vitamin B12 deficiency       Relevant Orders   Vitamin B12 (Completed)   Weight loss       Healthcare maintenance       Relevant Orders   POCT urinalysis dipstick (Completed)   Comprehensive metabolic panel (Completed)   Lipid Panel (Completed)   TSH (Completed)   Follow up          No orders of the defined types were placed in this encounter.   Follow-up: Return in about 6 months (around 05/23/2020).    Kallie Locks, FNP

## 2019-12-22 DIAGNOSIS — Z20828 Contact with and (suspected) exposure to other viral communicable diseases: Secondary | ICD-10-CM | POA: Diagnosis not present

## 2020-01-14 NOTE — Progress Notes (Signed)
PATIENT: Teresa Arnold DOB: 11/05/1975  REASON FOR VISIT: follow up HISTORY FROM: patient  HISTORY OF PRESENT ILLNESS: Today 01/15/20  Teresa Arnold is a 44 year old female with history of multiple sclerosis.  She remains on Mayzent 2 mg daily.  Initially when starting Mayzent, she developed daily headache, didn't want to start any medication for headache management.  MRI of the brain and cervical spine in June 2019, did not show any definite new lesions in the brain, possible new lesions from 2012, on the spinal cord.  In 2019, she suffered an exacerbation on rebif.  She takes trazodone as needed for sleep, maybe once a month.  She rarely has a headache now, maybe once a month.  Her overall condition is stable, her right leg is chronically weaker.  No falls, but she walks slowly and carefully to prevent this.  She has new glasses, progressive lens.  She is on disability, is at home with her 3 kids who are doing remote learning.  She saw her PCP a few months ago, B12, vitamin D were low, she is now on supplement for both.  She does some exercise at home, YouTube videos, and has a stationary bike.  Overall feels her condition is stable, no exacerbating symptoms.  HISTORY 07/17/2019 SS: Teresa Arnold is a 44 year old female with history of multiple sclerosis. She remains on Mayzent 2 mg tablet daily.  Her last MRI of the brain and cervical spine in June 2019, did not show any definite new lesions in the brain, possible new lesions from 2012 on the spinal cord.  She developed daily headaches when starting Mayzent.  She decided not to start daily medication, because the headaches improved.  Today, she says for the last month she has had a daily headache, it is not severe.  She will take Tylenol with relief.  She indicates her blood pressure has been under good control.  She says most bothersome with her chronic MS is her issues with balance and right leg weakness.  She has not had any falls.  She does  report some urinary urgency.  She denies any changes to the vision.  Her gait is still somewhat impaired from her injury in October 2019 where she fractured her left ankle, had to have screws placed.  She is on disability, she has 3 children.  She will take trazodone as needed at bedtime for sleep.  In the last year she has lost 40 pounds.  She presents today for follow-up unaccompanied.   REVIEW OF SYSTEMS: Out of a complete 14 system review of symptoms, the patient complains only of the following symptoms, and all other reviewed systems are negative.  Weakness, headache  ALLERGIES: Allergies  Allergen Reactions   Other Shortness Of Breath and Itching    Tampons   Gadolinium Derivatives Nausea And Vomiting    Pt only received half dose of 62ml before getting sick.    Sulfa Antibiotics Itching and Rash   Tysabri [Natalizumab] Rash    HOME MEDICATIONS: Outpatient Medications Prior to Visit  Medication Sig Dispense Refill   acetaminophen (TYLENOL) 325 MG tablet Take 650 mg by mouth every 6 (six) hours as needed for moderate pain.      pantoprazole (PROTONIX) 40 MG tablet TAKE 1 TABLET BY MOUTH EVERY DAY 30 tablet 10   Siponimod Fumarate (MAYZENT) 2 MG TABS Take 1 tablet by mouth daily. 90 tablet 2   traZODone (DESYREL) 50 MG tablet TAKE 1 TABLET (50 MG TOTAL) AT  BEDTIME BY MOUTH. 90 tablet 0   valACYclovir (VALTREX) 1000 MG tablet Take 1 tablet (1,000 mg total) by mouth daily. 90 tablet 2   vitamin B-12 (CYANOCOBALAMIN) 50 MCG tablet Take 1 tablet (50 mcg total) by mouth daily. 30 tablet 6   Vitamin D, Ergocalciferol, (DRISDOL) 1.25 MG (50000 UNIT) CAPS capsule Take 1 capsule (50,000 Units total) by mouth every 7 (seven) days. 5 capsule 6   No facility-administered medications prior to visit.    PAST MEDICAL HISTORY: Past Medical History:  Diagnosis Date   Bronchitis    Chronic insomnia 05/22/2017   GERD (gastroesophageal reflux disease)    Iron deficiency anemia  05/22/2017   Morbid obesity (HCC) 05/22/2017   MS (multiple sclerosis) (HCC)    Vitamin B12 deficiency 11/2019   Vitamin D deficiency 11/2019    PAST SURGICAL HISTORY: Past Surgical History:  Procedure Laterality Date   CESAREAN SECTION     x4    FAMILY HISTORY: Family History  Problem Relation Age of Onset   Hypertension Mother    Hypertension Other     SOCIAL HISTORY: Social History   Socioeconomic History   Marital status: Single    Spouse name: Not on file   Number of children: 4   Years of education: 14   Highest education level: Not on file  Occupational History   Not on file  Tobacco Use   Smoking status: Never Smoker   Smokeless tobacco: Never Used  Substance and Sexual Activity   Alcohol use: Not Currently    Alcohol/week: 0.0 standard drinks    Comment: occasionally   Drug use: No   Sexual activity: Yes    Birth control/protection: None  Other Topics Concern   Not on file  Social History Narrative   Patient is single and lives with her family.   Patient has four children.   Patient has a college education.   Patient is right-handed.   Patient does not drink any caffeine.   Social Determinants of Health   Financial Resource Strain:    Difficulty of Paying Living Expenses:   Food Insecurity:    Worried About Programme researcher, broadcasting/film/video in the Last Year:    Barista in the Last Year:   Transportation Needs:    Freight forwarder (Medical):    Lack of Transportation (Non-Medical):   Physical Activity:    Days of Exercise per Week:    Minutes of Exercise per Session:   Stress:    Feeling of Stress :   Social Connections:    Frequency of Communication with Friends and Family:    Frequency of Social Gatherings with Friends and Family:    Attends Religious Services:    Active Member of Clubs or Organizations:    Attends Banker Meetings:    Marital Status:   Intimate Partner Violence:    Fear of  Current or Ex-Partner:    Emotionally Abused:    Physically Abused:    Sexually Abused:    PHYSICAL EXAM  Vitals:   01/15/20 1014  BP: 116/72  Pulse: 62  Temp: (!) 97 F (36.1 C)  Weight: 188 lb 3.2 oz (85.4 kg)  Height: 5' 4.5" (1.638 m)   Body mass index is 31.81 kg/m.  Generalized: Well developed, in no acute distress   Neurological examination  Mentation: Alert oriented to time, place, history taking. Follows all commands speech and language fluent Cranial nerve II-XII: Pupils were equal round reactive to  light. Extraocular movements were full, visual field were full on confrontational test. Facial sensation and strength were normal. Head turning and shoulder shrug  were normal and symmetric. Motor: The motor testing reveals 5 over 5 strength of all 4 extremities, right 4/5 hip flexion, knee extension, ankle dorsiflexion Sensory: Sensory testing is intact to soft touch on all 4 extremities. No evidence of extinction is noted.  Coordination: Cerebellar testing reveals good finger-nose-finger and heel-to-shin bilaterally.  Gait and station: Slight limp on the right, tandem gait was deferred, Romberg slightly unsteady, tends to lean to the right Reflexes: Deep tendon reflexes are symmetric but somewhat brisk on the right side   DIAGNOSTIC DATA (LABS, IMAGING, TESTING) - I reviewed patient records, labs, notes, testing and imaging myself where available.  Lab Results  Component Value Date   WBC 2.1 (LL) 11/21/2019   HGB 10.7 (L) 11/21/2019   HCT 33.2 (L) 11/21/2019   MCV 81 11/21/2019   PLT 317 11/21/2019      Component Value Date/Time   NA 139 11/21/2019 1215   K 4.1 11/21/2019 1215   CL 105 11/21/2019 1215   CO2 23 11/21/2019 1215   GLUCOSE 77 11/21/2019 1215   GLUCOSE 86 05/21/2017 1142   BUN 17 11/21/2019 1215   CREATININE 0.69 11/21/2019 1215   CREATININE 0.67 05/21/2017 1142   CALCIUM 9.0 11/21/2019 1215   PROT 6.9 11/21/2019 1215   ALBUMIN 4.2  11/21/2019 1215   AST 15 11/21/2019 1215   ALT 11 11/21/2019 1215   ALKPHOS 66 11/21/2019 1215   BILITOT 0.2 11/21/2019 1215   GFRNONAA 108 11/21/2019 1215   GFRNONAA 111 05/21/2017 1142   GFRAA 125 11/21/2019 1215   GFRAA 128 05/21/2017 1142   Lab Results  Component Value Date   CHOL 189 11/21/2019   HDL 61 11/21/2019   LDLCALC 119 (H) 11/21/2019   TRIG 47 11/21/2019   CHOLHDL 3.1 11/21/2019   Lab Results  Component Value Date   HGBA1C 5.3 05/21/2017   Lab Results  Component Value Date   VITAMINB12 208 (L) 11/21/2019   Lab Results  Component Value Date   TSH 0.975 11/21/2019      ASSESSMENT AND PLAN 44 y.o. year old female  has a past medical history of Bronchitis, Chronic insomnia (05/22/2017), GERD (gastroesophageal reflux disease), Iron deficiency anemia (05/22/2017), Morbid obesity (HCC) (05/22/2017), MS (multiple sclerosis) (HCC), Vitamin B12 deficiency (11/2019), and Vitamin D deficiency (11/2019). here with:  1.  Multiple sclerosis, relapsing remitting 2.  Headache  The patient remains on Mayzent.  She no longer complains of headache as result.  She seems to overall be doing well.  Review of laboratory evaluation from PCP in March reveals WBC 2.1, absolute lymphocyte count very low at 0.1.  I will recheck this today, along with CMP and vitamin D.  Adjustment to Mayzent dosing may be necessary.  I will order repeat MRI of the brain and cervical spine, the scans will be done without contrast, the patient has an allergy to gadolinium.  She will follow-up in 6 months or sooner if needed.  I spent 30 minutes of face-to-face and non-face-to-face time with patient.  This included previsit chart review, lab review, study review, order entry, electronic health record documentation, patient education.  Margie Ege, AGNP-C, DNP 01/15/2020, 10:47 AM [facility] [address] [place], [place] [address] [phone]

## 2020-01-15 ENCOUNTER — Telehealth: Payer: Self-pay | Admitting: Neurology

## 2020-01-15 ENCOUNTER — Encounter: Payer: Self-pay | Admitting: Neurology

## 2020-01-15 ENCOUNTER — Other Ambulatory Visit: Payer: Self-pay

## 2020-01-15 ENCOUNTER — Ambulatory Visit (INDEPENDENT_AMBULATORY_CARE_PROVIDER_SITE_OTHER): Payer: Medicare Other | Admitting: Neurology

## 2020-01-15 VITALS — BP 116/72 | HR 62 | Temp 97.0°F | Ht 64.5 in | Wt 188.2 lb

## 2020-01-15 DIAGNOSIS — R519 Headache, unspecified: Secondary | ICD-10-CM

## 2020-01-15 DIAGNOSIS — G35 Multiple sclerosis: Secondary | ICD-10-CM | POA: Diagnosis not present

## 2020-01-15 DIAGNOSIS — E559 Vitamin D deficiency, unspecified: Secondary | ICD-10-CM | POA: Diagnosis not present

## 2020-01-15 NOTE — Patient Instructions (Signed)
Order MRI of the brain and cervical spine Check blood work today  See you back in 6 months

## 2020-01-15 NOTE — Telephone Encounter (Signed)
medicare/medicaid order sent to [facility]. No auth they will reach out to the patient to schedule.  °

## 2020-01-16 LAB — CBC WITH DIFFERENTIAL/PLATELET
Basophils Absolute: 0 10*3/uL (ref 0.0–0.2)
Basos: 1 %
EOS (ABSOLUTE): 0.1 10*3/uL (ref 0.0–0.4)
Eos: 2 %
Hematocrit: 34.9 % (ref 34.0–46.6)
Hemoglobin: 11.2 g/dL (ref 11.1–15.9)
Immature Grans (Abs): 0 10*3/uL (ref 0.0–0.1)
Immature Granulocytes: 1 %
Lymphocytes Absolute: 0.2 10*3/uL — ABNORMAL LOW (ref 0.7–3.1)
Lymphs: 7 %
MCH: 25.9 pg — ABNORMAL LOW (ref 26.6–33.0)
MCHC: 32.1 g/dL (ref 31.5–35.7)
MCV: 81 fL (ref 79–97)
Monocytes Absolute: 0.4 10*3/uL (ref 0.1–0.9)
Monocytes: 14 %
Neutrophils Absolute: 2 10*3/uL (ref 1.4–7.0)
Neutrophils: 75 %
Platelets: 287 10*3/uL (ref 150–450)
RBC: 4.33 x10E6/uL (ref 3.77–5.28)
RDW: 12.3 % (ref 11.7–15.4)
WBC: 2.7 10*3/uL — ABNORMAL LOW (ref 3.4–10.8)

## 2020-01-16 LAB — COMPREHENSIVE METABOLIC PANEL
ALT: 14 IU/L (ref 0–32)
AST: 18 IU/L (ref 0–40)
Albumin/Globulin Ratio: 1.7 (ref 1.2–2.2)
Albumin: 4.3 g/dL (ref 3.8–4.8)
Alkaline Phosphatase: 72 IU/L (ref 39–117)
BUN/Creatinine Ratio: 18 (ref 9–23)
BUN: 12 mg/dL (ref 6–24)
Bilirubin Total: 0.2 mg/dL (ref 0.0–1.2)
CO2: 23 mmol/L (ref 20–29)
Calcium: 9.2 mg/dL (ref 8.7–10.2)
Chloride: 105 mmol/L (ref 96–106)
Creatinine, Ser: 0.67 mg/dL (ref 0.57–1.00)
GFR calc Af Amer: 126 mL/min/{1.73_m2} (ref >59)
GFR calc non Af Amer: 110 mL/min/{1.73_m2} (ref >59)
Globulin, Total: 2.6 g/dL (ref 1.5–4.5)
Glucose: 79 mg/dL (ref 65–99)
Potassium: 4.5 mmol/L (ref 3.5–5.2)
Sodium: 141 mmol/L (ref 134–144)
Total Protein: 6.9 g/dL (ref 6.0–8.5)

## 2020-01-16 LAB — VITAMIN D 25 HYDROXY (VIT D DEFICIENCY, FRACTURES): Vit D, 25-Hydroxy: 39 ng/mL (ref 30.0–100.0)

## 2020-01-16 NOTE — Progress Notes (Signed)
I have read the note, and I agree with the clinical assessment and plan.  Sukari Grist K Muna Demers   

## 2020-01-20 ENCOUNTER — Telehealth: Payer: Self-pay | Admitting: *Deleted

## 2020-01-20 NOTE — Telephone Encounter (Signed)
-----   Message from Glean Salvo, NP sent at 01/19/2020  4:58 PM EDT ----- Patient is on Mayzent. Labs show WBC 2.7, absolute lymphocyte 0.2, this is okay, but has previously been lower than desired 1 month ago. Recheck this is 3 months. I have sent myself a reminder.

## 2020-01-20 NOTE — Telephone Encounter (Signed)
Called pt relayed that her WBC and Lymphocytes count levels are up from 2 months ago, still wants to monitor and repeat in 3 months.  Pt on mayzent.  She is doing ok. She verbalized understanding.

## 2020-02-18 ENCOUNTER — Telehealth: Payer: Self-pay | Admitting: *Deleted

## 2020-02-18 NOTE — Telephone Encounter (Signed)
Teresa Arnold: [id number] (exp. 02/18/20 to 08/16/20) patient scheduled at GI for 02/19/20.

## 2020-02-18 NOTE — Telephone Encounter (Signed)
Received from [place] that mayzent not on formulary. She was given 30 day temp refill.  02-13-20.  Needs formulary exception.

## 2020-02-19 ENCOUNTER — Ambulatory Visit
Admission: RE | Admit: 2020-02-19 | Discharge: 2020-02-19 | Disposition: A | Discharge status: Home / Self Care | Payer: Medicare HMO | Source: Ambulatory Visit | Attending: Neurology | Admitting: Neurology

## 2020-02-19 DIAGNOSIS — G35 Multiple sclerosis: Secondary | ICD-10-CM

## 2020-02-23 ENCOUNTER — Telehealth: Payer: Self-pay

## 2020-02-23 NOTE — Telephone Encounter (Addendum)
LM on the VM for the patient to call back. If pt call back please relay the msg in bold.    ----- Message from Glean Salvo, NP sent at 02/23/2020  7:59 AM EDT ----- MRI of the brain and cervical spine is overall stable. No new lesions.     IMPRESSION: This MRI of the cervical spine without contrast shows the following: 1.   Multiple T2 hyperintense foci within the spinal cord in a pattern consistent with chronic demyelinating plaque associated with multiple sclerosis.  None of the foci appear to be acute.  Compared to the MRI dated 02/22/2018, there are no new lesions. 2.   Mild degenerative changes at C3-C4 and C4-C6 that do not lead to nerve root compression.  These are stable compared to the 2019 MRI.  IMPRESSION: This MRI of the brain without contrast shows the following: 1.   Multiple T2/FLAIR hyperintense foci in the hemispheres, brainstem and cerebellum in a pattern and configuration consistent with chronic demyelinating plaque associated with multiple sclerosis.  None of the foci appears to be acute.  Compared to the MRI dated 06/05/2017, there are no definite interval changes. 2.   There are no acute findings.

## 2020-02-23 NOTE — Telephone Encounter (Signed)
Pt called back, results were given no questions.

## 2020-02-26 ENCOUNTER — Encounter: Payer: Self-pay | Admitting: *Deleted

## 2020-02-26 NOTE — Telephone Encounter (Signed)
Opened in error

## 2020-02-26 NOTE — Telephone Encounter (Signed)
I called mayzent [facility] [phone] spoke to [place].     They will start the PA for pt.  I gave her updated information insurance, demographics.  She will let us know when we need to do anything further.

## 2020-03-08 NOTE — Telephone Encounter (Signed)
Teresa Arnold Teresa Arnold with [employer] called stating the patient got a new insurance and needs a new PA for [employer] and can go through cover my meds. The Key code is [id number]. If you need to contact her for anything her number is [phone].

## 2020-03-09 NOTE — Telephone Encounter (Signed)
I called Teresa Arnold and LMVM for her that received her message.  Tried to do PA on the Key listed and it would not allow me to move forward.

## 2020-03-09 NOTE — Telephone Encounter (Signed)
Spoke to [place], for [employer].  She was able to unarchive the [facility] Key for me to complete.  Initiated CMM for Mayzent 2mg .  Pt was on REBIF/Rebidose 2014 then mayzent 2019.  Tysabri allergic reaction prior to 2014.

## 2020-03-10 NOTE — Telephone Encounter (Addendum)
LMVM for Marcelino Duster with [employer] that received approval for [employer] from [place] 02-10-20 thru 09-10-20.  Fax confirmation to [employer] received. [phone]

## 2020-03-10 NOTE — Telephone Encounter (Signed)
Received approval for [employer] 02-10-20 thru 09-10-20.ID [id number].

## 2020-03-16 ENCOUNTER — Other Ambulatory Visit: Payer: Self-pay

## 2020-03-16 ENCOUNTER — Telehealth: Payer: Self-pay

## 2020-03-16 MED ORDER — MAYZENT 2 MG PO TABS: 1.0000 | ORAL_TABLET | Freq: Every day | ORAL | 2 refills | Status: DC

## 2020-03-16 NOTE — Telephone Encounter (Signed)
I called pt about which pharmacy delivers her mayzent medication for MS. Pt stated it comes from [facility]. But she change insurance as of this month. She stated the previous refill came from [facility]. I stated [facility] sent [place] a request.PT stated she was told she could not get 90 day refill on medication and it will have to come monthly. I stated a call will be made to [facility] to make sure that's valid.

## 2020-03-16 NOTE — Telephone Encounter (Signed)
[facility].They stated the refill can be sent to [facility] because its apart of the same pharmacy. I stated refill was sent today.

## 2020-04-14 IMAGING — DX DG ANKLE COMPLETE 3+V*L*
3 series · 3 of 3 positions shown · non-contrast
Comparison: Left tibia and fibular radiographs-earlier same day

CLINICAL DATA: Patient fell taking daughter to school earlier
today.

EXAM:
LEFT ANKLE COMPLETE - 3+ VIEW

[ankle ap]
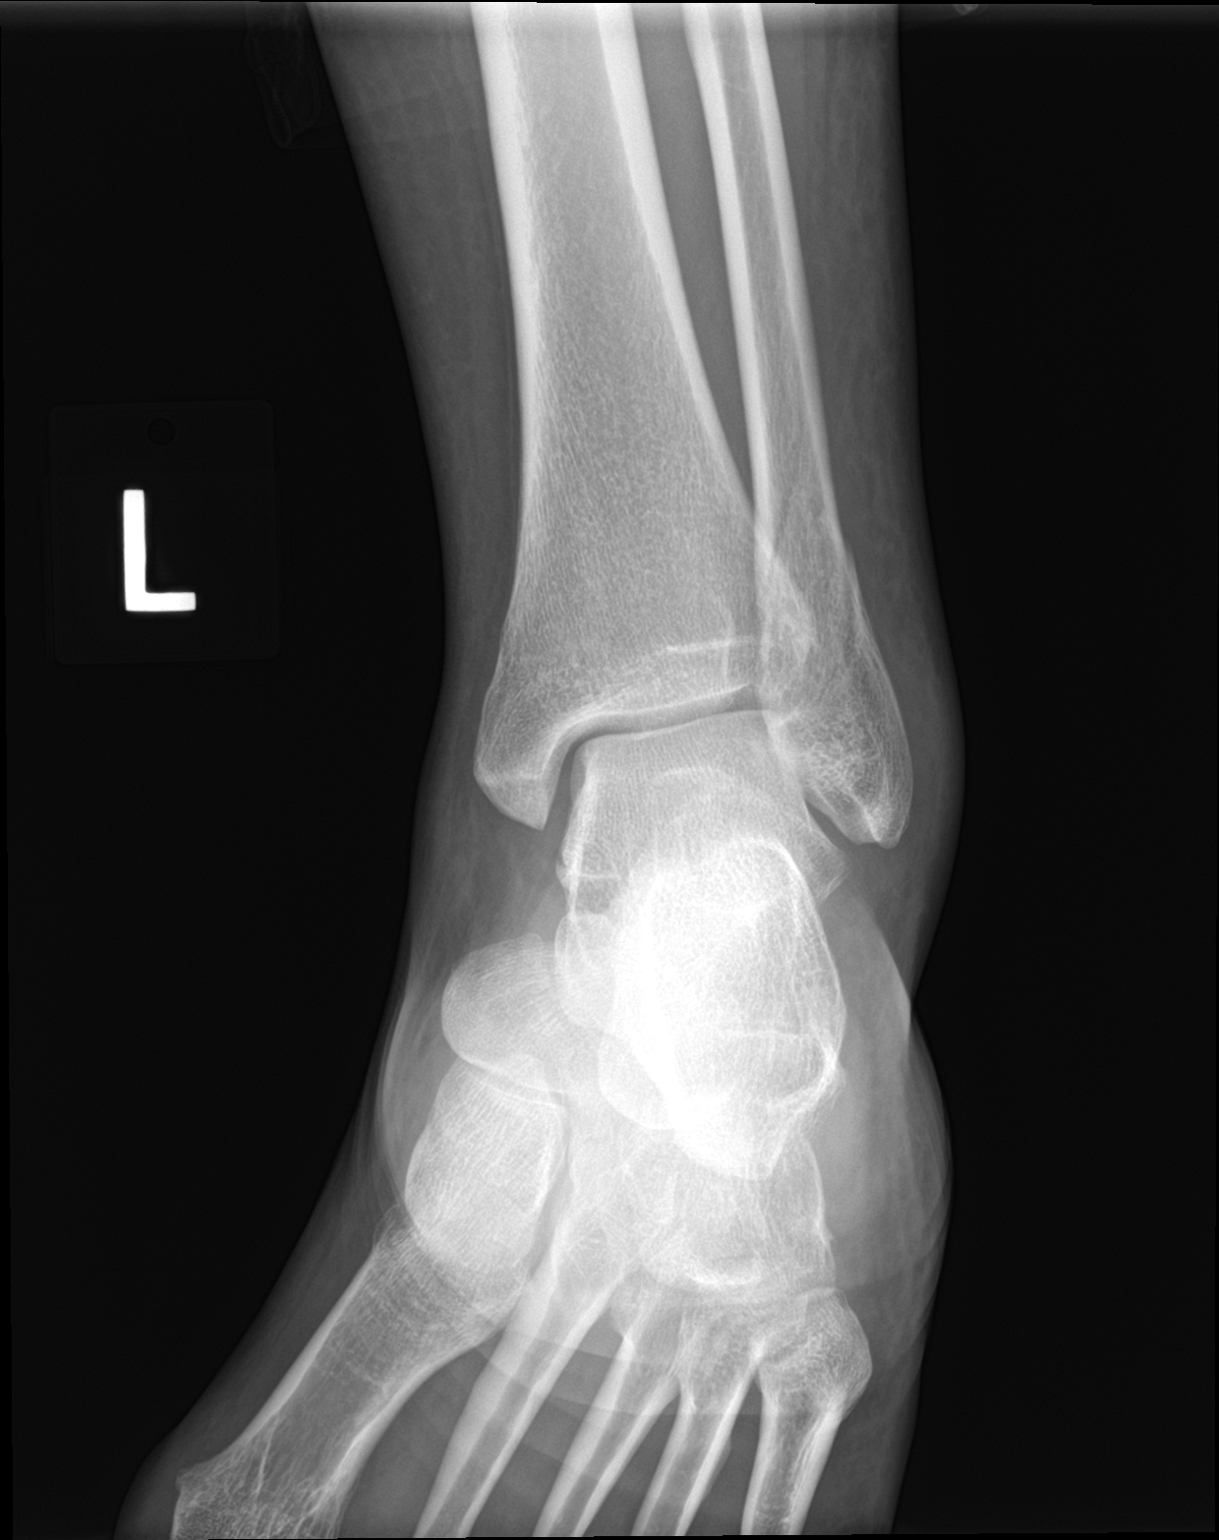

[ankle obl]
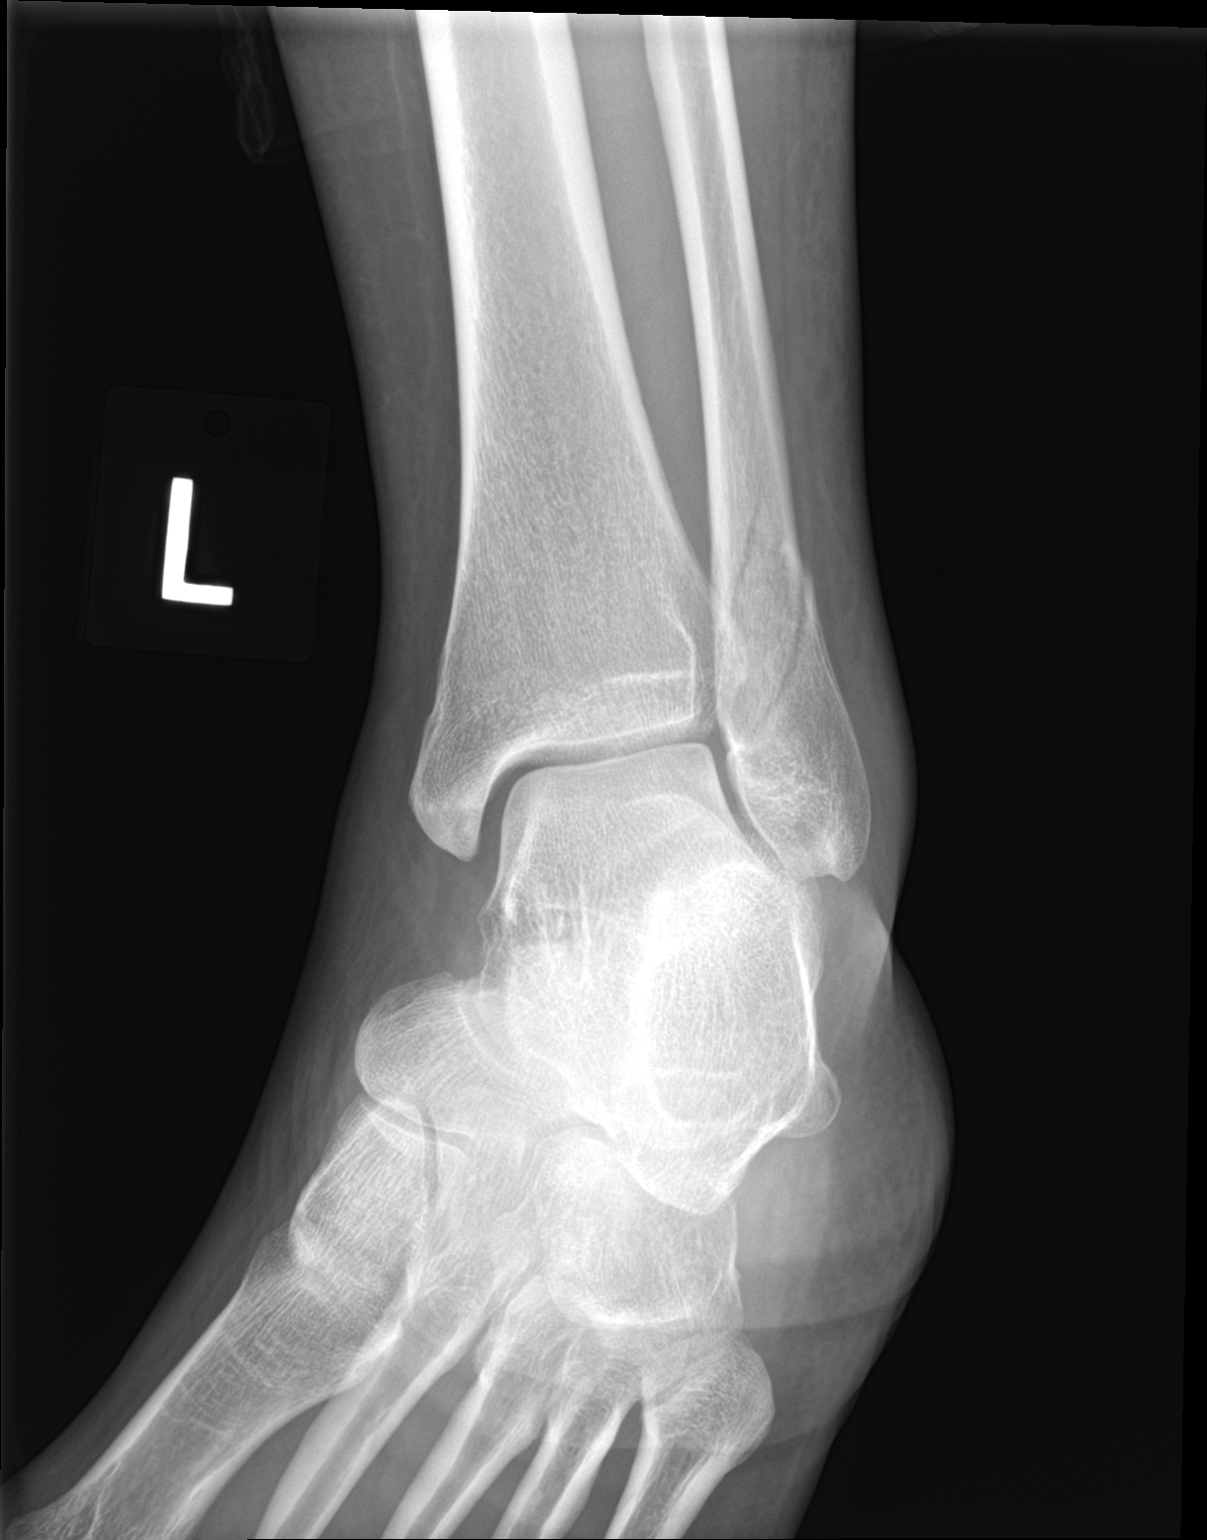

[ankle lat]
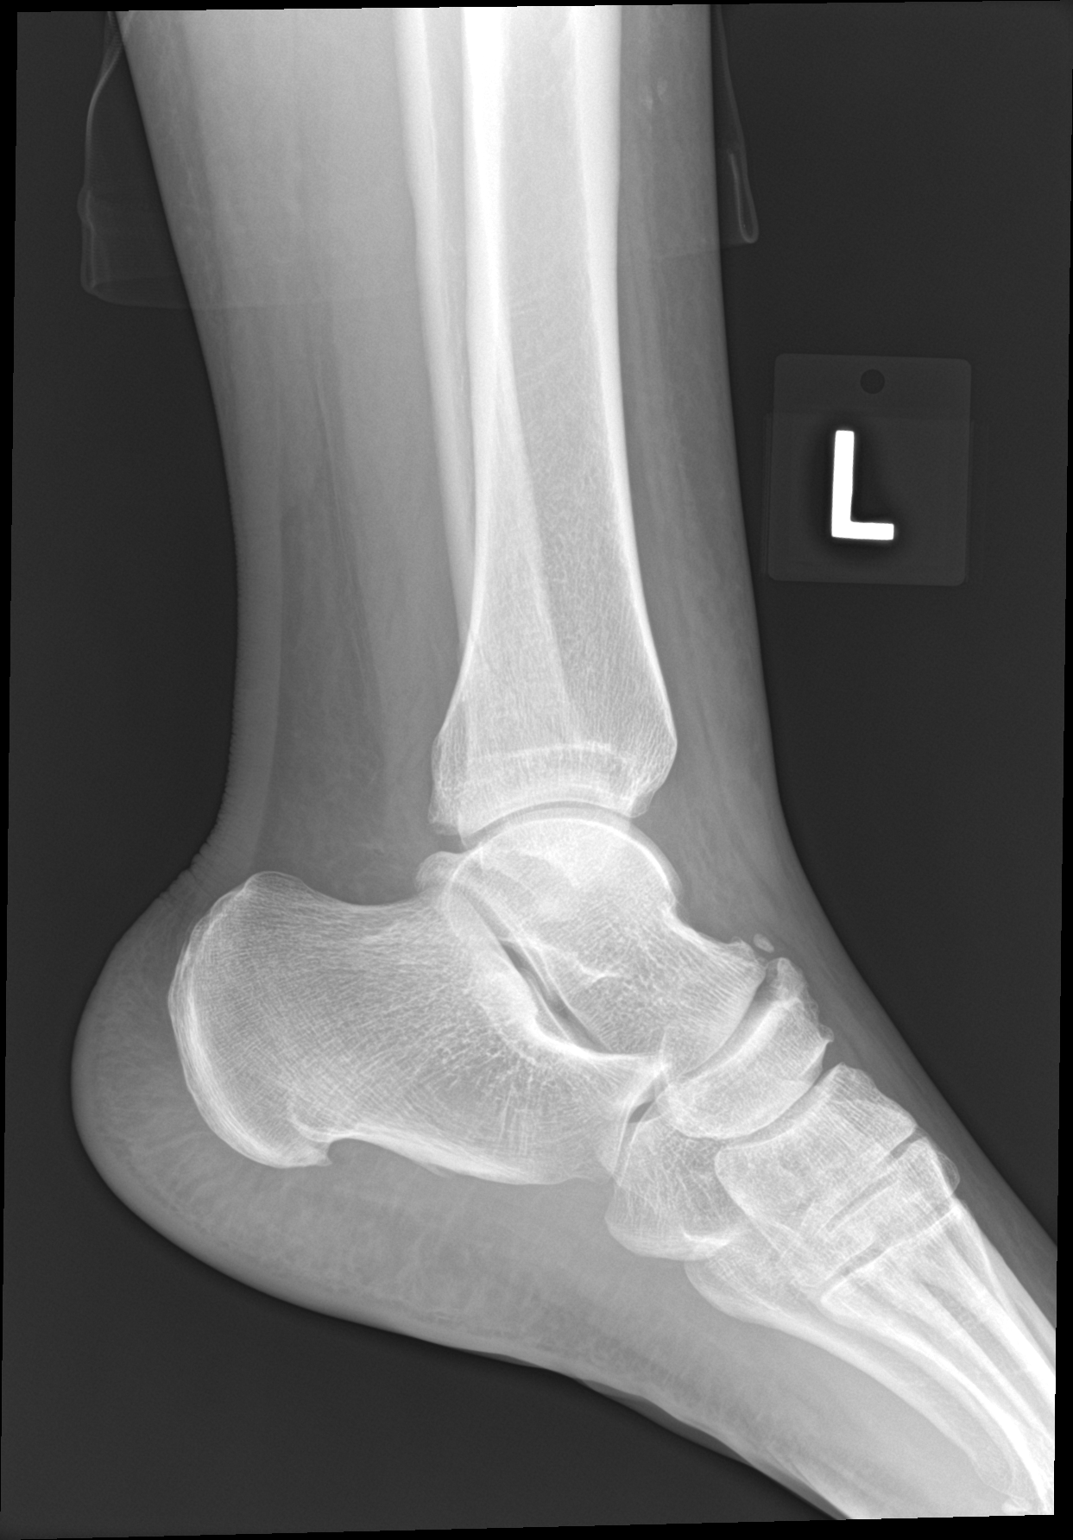

[3 of 3 positions shown; findings below may reference images not displayed]

FINDINGS: There is an acute, minimally displaced obliquely oriented fracture
of the distal fibula with extension to the distal tib-fib joint.
Ankle mortise is preserved.

Tiny ossicle adjacent to the talar beak likely represents sequela of
prior/remote avulsive injury. Mild degenerative change of several
midfoot articulations. Tiny plantar calcaneal spur.

Suspected soft tissue swelling about the anterolateral aspect of the
ankle. No radiopaque foreign body.
IMPRESSION: Acute, minimally displaced fracture of the distal fibula with
extension to the distal tib-fib joint.

## 2020-04-19 ENCOUNTER — Telehealth: Payer: Self-pay | Admitting: Neurology

## 2020-04-19 NOTE — Telephone Encounter (Signed)
Teresa [email or web address]@ [facility] has called stating today is day 1 pt will be without her Siponimod Fumarate (MAYZENT) 2 MG TABS.  Teresa Arnold has asked this be sent as a high priority based on pt being without this medication today. Teresa Arnold can be called at [phone] xt [phone]

## 2020-04-19 NOTE — Telephone Encounter (Signed)
I reached out to [facility] and spoke with Katrina she sts the [facility] rx was on hold and clarification was needed for rx.  I was transferred to the pharmacy department and spoke with pharmacist, Shanda Bumps. She sts the order was on hold due to needing the supervising physician information for Dr. Lesia Sago. I provided the information needed and was advised the med will processed and shipped out ASAP. I advised Shanda Bumps that the pt was w/o her meds.  I called the pt and advised I had spoke with the Pharmacy and they should be calling her to set shipment today. Pt was advised if she had not heard from the SP by this afternoon to call them and schedule delivery.  Pt verbalized understanding.

## 2020-04-20 ENCOUNTER — Telehealth: Payer: Self-pay | Admitting: Neurology

## 2020-04-20 DIAGNOSIS — G35 Multiple sclerosis: Secondary | ICD-10-CM

## 2020-04-20 DIAGNOSIS — Z5181 Encounter for therapeutic drug level monitoring: Secondary | ICD-10-CM

## 2020-04-20 NOTE — Telephone Encounter (Signed)
-----   Message from Glean Salvo, NP sent at 01/19/2020  4:58 PM EDT ----- Recheck CBC with diff on [facility]

## 2020-04-20 NOTE — Addendum Note (Signed)
Addended by: Guy Begin on: 04/20/2020 04:09 PM   Modules accepted: Orders

## 2020-04-20 NOTE — Telephone Encounter (Signed)
Called pt and relayed SS/NP wants to recheck CBC diff. She verbalized understanding.  Gave her days times for lab availability.

## 2020-05-24 ENCOUNTER — Telehealth: Payer: Self-pay | Admitting: Neurology

## 2020-05-24 ENCOUNTER — Ambulatory Visit: Payer: Medicare Other | Admitting: Family Medicine

## 2020-05-24 MED ORDER — PREDNISONE 50 MG PO TABS: ORAL_TABLET | ORAL | 0 refills | Status: DC

## 2020-05-24 NOTE — Telephone Encounter (Signed)
I called the patient, report Thursday, left eye pain, and with moving side to side.  Feels her eyes jumping inside.  Has blurry vision.  No other MS new symptoms.  Has previously had this in 2016, was treated with IV steroids.  Discussed with Dr. Anne Hahn, will go ahead and treat the patient has MS exacerbation, will use high-dose oral steroids 500 mg daily x 3 days.   Please get the patient set up for revisit in the 1-2 weeks.

## 2020-05-24 NOTE — Telephone Encounter (Signed)
I called pt she is having L behind eye pain since last Thursday Friday.  Slight red, no drainage. [place] [facility] [place] she stated few months ok, new glasses.  On [facility].  No other MS sx.  (seems more blurry then R eye) (constant, was progressive worsening).  Please advise.

## 2020-05-24 NOTE — Telephone Encounter (Signed)
Pt called stating that she is needing to speak to the RN about her vision that has recently been giving her issues. Pt states she is having a lot of pain in her L eye. Please advise.

## 2020-05-25 NOTE — Telephone Encounter (Signed)
Called pt and she refused appt offered for 06-01-20 in the afternoon 1315 and 1515 due to her picking up her kids from school.  I will keep her in cancellation list. (need morning time)

## 2020-05-25 NOTE — Telephone Encounter (Signed)
Checked schedule and no appt available at this time, will keep checking.

## 2020-05-28 ENCOUNTER — Encounter: Payer: Self-pay | Admitting: Family Medicine

## 2020-05-28 ENCOUNTER — Ambulatory Visit (INDEPENDENT_AMBULATORY_CARE_PROVIDER_SITE_OTHER): Payer: Medicare HMO | Admitting: Family Medicine

## 2020-05-28 ENCOUNTER — Other Ambulatory Visit: Payer: Self-pay

## 2020-05-28 VITALS — BP 146/86 | HR 60 | Temp 97.2°F | Ht 63.0 in | Wt 187.2 lb

## 2020-05-28 DIAGNOSIS — Z09 Encounter for follow-up examination after completed treatment for conditions other than malignant neoplasm: Secondary | ICD-10-CM

## 2020-05-28 DIAGNOSIS — I10 Essential (primary) hypertension: Secondary | ICD-10-CM

## 2020-05-28 DIAGNOSIS — Z0000 Encounter for general adult medical examination without abnormal findings: Secondary | ICD-10-CM | POA: Diagnosis not present

## 2020-05-28 DIAGNOSIS — Z7189 Other specified counseling: Secondary | ICD-10-CM | POA: Diagnosis not present

## 2020-05-28 DIAGNOSIS — R634 Abnormal weight loss: Secondary | ICD-10-CM

## 2020-05-28 DIAGNOSIS — G35 Multiple sclerosis: Secondary | ICD-10-CM | POA: Diagnosis not present

## 2020-05-28 DIAGNOSIS — G35D Multiple sclerosis, unspecified: Secondary | ICD-10-CM

## 2020-05-28 LAB — POCT URINALYSIS DIPSTICK
Bilirubin, UA: NEGATIVE
Glucose, UA: NEGATIVE
Ketones, UA: NEGATIVE
Leukocytes, UA: NEGATIVE
Nitrite, UA: NEGATIVE
Protein, UA: POSITIVE — AB
Spec Grav, UA: >=1.03 — AB (ref 1.010–1.025)
Urobilinogen, UA: 0.2 E.U./dL
pH, UA: 6 (ref 5.0–8.0)

## 2020-05-28 LAB — POCT GLYCOSYLATED HEMOGLOBIN (HGB A1C)
HbA1c POC (<> result, manual entry): 5.2 % (ref 4.0–5.6)
HbA1c, POC (controlled diabetic range): 5.2 % (ref 0.0–7.0)
HbA1c, POC (prediabetic range): 5.2 % — AB (ref 5.7–6.4)
Hemoglobin A1C: 5.2 % (ref 4.0–5.6)

## 2020-05-28 LAB — GLUCOSE, POCT (MANUAL RESULT ENTRY): POC Glucose: 118 mg/dl — AB (ref 70–99)

## 2020-05-28 NOTE — Progress Notes (Signed)
[facility]   Established Patient Office Visit  Subjective:  Patient ID: Teresa Arnold, female    DOB: July 06, 1976  Age: 44 y.o. MRN: [id number]  CC:  Chief Complaint  Patient presents with  . Follow-up    Pt states she has a question about a test that you ordered for her.    HPI Teresa Arnold is a 44 year old female who presents for Follow Up today.    Patient Active Problem List   Diagnosis Date Noted  . Vitamin D deficiency 11/23/2019  . Weight loss 11/23/2019  . Headache 07/17/2019  . Class 3 severe obesity due to excess calories with serious comorbidity and body mass index (BMI) of 40.0 to 44.9 in adult (HCC) 11/01/2018  . Frequency of urination 11/01/2018  . Dysuria 11/01/2018  . Possible exposure to STD 11/01/2018  . Urinary tract infection without hematuria 11/01/2018  . Chronic insomnia 05/22/2017  . Iron deficiency anemia 05/22/2017  . Class 1 obesity due to excess calories with serious comorbidity and body mass index (BMI) of 33.0 to 33.9 in adult 05/22/2017  . Acid reflux 12/04/2016  . 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 Multiple sclerosis (HCC) 02/13/2013   Current Status: Since her last office visit, she is doing well with no complaints. She continues to follow up with Neurology for MS. She denies fevers, chills, fatigue, recent infections, weight loss, and night sweats. She has not had any headaches, visual changes, dizziness, and falls. No chest pain, heart palpitations, cough and shortness of breath reported. Denies GI problems such as nausea, vomiting, diarrhea, and constipation. She has no reports of blood in stools, dysuria and hematuria. No depression or anxiety, and denies suicidal ideations, homicidal ideations, or auditory hallucinations. She is taking all medications as prescribed. She denies pain today.   Past Medical History:  Diagnosis Date  . Bronchitis   . Chronic insomnia 05/22/2017  . GERD  (gastroesophageal reflux disease)   . Iron deficiency anemia 05/22/2017  . Morbid obesity (HCC) 05/22/2017  . MS (multiple sclerosis) (HCC)   . Vitamin B12 deficiency 11/2019  . Vitamin D deficiency 11/2019    Past Surgical History:  Procedure Laterality Date  . CESAREAN SECTION     x4    Family History  Problem Relation Age of Onset  . Hypertension Mother   . Hypertension Other     Social History   Socioeconomic History  . Marital status: Single    Spouse name: Not on file  . Number of children: 4  . Years of education: 65  . Highest education level: Not on file  Occupational History  . Not on file  Tobacco Use  . Smoking status: Never Smoker  . Smokeless tobacco: Never Used  Vaping Use  . Vaping Use: Never used  Substance and Sexual Activity  . Alcohol use: Not Currently    Alcohol/week: 0.0 standard drinks    Comment: occasionally  . Drug use: No  . Sexual activity: Yes    Birth control/protection: None  Other Topics Concern  . Not on file  Social History Narrative   Patient is single and lives with her family.   Patient has four children.   Patient has a college education.   Patient is right-handed.   Patient does not drink any caffeine.   Social Determinants of Health   Financial Resource Strain:   . Difficulty of Paying Living Expenses: Not on file  Food Insecurity:   .  Worried About Programme researcher, broadcasting/film/video in the Last Year: Not on file  . Ran Out of Food in the Last Year: Not on file  Transportation Needs:   . Lack of Transportation (Medical): Not on file  . Lack of Transportation (Non-Medical): Not on file  Physical Activity:   . Days of Exercise per Week: Not on file  . Minutes of Exercise per Session: Not on file  Stress:   . Feeling of Stress : Not on file  Social Connections:   . Frequency of Communication with Friends and Family: Not on file  . Frequency of Social Gatherings with Friends and Family: Not on file  . Attends Religious Services:  Not on file  . Active Member of Clubs or Organizations: Not on file  . Attends Banker Meetings: Not on file  . Marital Status: Not on file  Intimate Partner Violence:   . Fear of Current or Ex-Partner: Not on file  . Emotionally Abused: Not on file  . Physically Abused: Not on file  . Sexually Abused: Not on file    Outpatient Medications Prior to Visit  Medication Sig Dispense Refill  . acetaminophen (TYLENOL) 325 MG tablet Take 650 mg by mouth every 6 (six) hours as needed for moderate pain.     . predniSONE (DELTASONE) 50 MG tablet Take 10 tablets daily x 3 days for MS exacerbation 30 tablet 0  . Siponimod Fumarate (MAYZENT) 2 MG TABS Take 1 tablet by mouth daily. 90 tablet 2  . pantoprazole (PROTONIX) 40 MG tablet TAKE 1 TABLET BY MOUTH EVERY DAY (Patient not taking: Reported on 05/28/2020) 30 tablet 10  . traZODone (DESYREL) 50 MG tablet TAKE 1 TABLET (50 MG TOTAL) AT BEDTIME BY MOUTH. (Patient not taking: Reported on 05/28/2020) 90 tablet 0  . valACYclovir (VALTREX) 1000 MG tablet Take 1 tablet (1,000 mg total) by mouth daily. (Patient not taking: Reported on 05/28/2020) 90 tablet 2  . vitamin B-12 (CYANOCOBALAMIN) 50 MCG tablet Take 1 tablet (50 mcg total) by mouth daily. (Patient not taking: Reported on 05/28/2020) 30 tablet 6  . Vitamin D, Ergocalciferol, (DRISDOL) 1.25 MG (50000 UNIT) CAPS capsule Take 1 capsule (50,000 Units total) by mouth every 7 (seven) days. (Patient not taking: Reported on 05/28/2020) 5 capsule 6   No facility-administered medications prior to visit.    Allergies  Allergen Reactions  . Other Shortness Of Breath and Itching    Tampons  . Gadolinium Derivatives Nausea And Vomiting    Pt only received half dose of 52ml before getting sick.   . Sulfa Antibiotics Itching and Rash  . Tysabri [Natalizumab] Rash    ROS Review of Systems  Constitutional: Negative.   HENT: Negative.   Eyes: Negative.   Respiratory: Negative.   Cardiovascular:  Negative.   Gastrointestinal: Positive for abdominal distention.  Endocrine: Negative.   Genitourinary: Negative.   Musculoskeletal: Positive for arthralgias (generalized joint pain).  Skin: Negative.   Allergic/Immunologic: Negative.   Neurological: Positive for dizziness (occasional ), weakness (occasional r/t her Multiple Sclerosis ) and headaches (occasional ).  Hematological: Negative.   Psychiatric/Behavioral: Negative.       Objective:    Physical Exam Vitals and nursing note reviewed.  Constitutional:      Appearance: Normal appearance.  HENT:     Head: Normocephalic and atraumatic.     Nose: Nose normal.     Mouth/Throat:     Mouth: Mucous membranes are moist.     Pharynx: Oropharynx  is clear.  Cardiovascular:     Rate and Rhythm: Normal rate and regular rhythm.     Pulses: Normal pulses.     Heart sounds: Normal heart sounds.  Pulmonary:     Effort: Pulmonary effort is normal.     Breath sounds: Normal breath sounds.  Abdominal:     General: Bowel sounds are normal. There is distension (obese).     Palpations: Abdomen is soft.  Musculoskeletal:        General: Normal range of motion.     Cervical back: Normal range of motion and neck supple.  Skin:    General: Skin is warm and dry.  Neurological:     General: No focal deficit present.     Mental Status: She is alert and oriented to person, place, and time.  Psychiatric:        Mood and Affect: Mood normal.        Behavior: Behavior normal.        Thought Content: Thought content normal.        Judgment: Judgment normal.    BP (!) 146/86 (BP Location: Left Arm, Patient Position: Sitting, Cuff Size: Large)   Pulse 60   Temp (!) 97.2 F (36.2 C)   Ht 5\' 3"  (1.6 m)   Wt 187 lb 3.2 oz (84.9 kg)   LMP 05/01/2020 (Exact Date)   SpO2 100%   BMI 33.16 kg/m  Wt Readings from Last 3 Encounters:  05/28/20 187 lb 3.2 oz (84.9 kg)  01/15/20 188 lb 3.2 oz (85.4 kg)  11/21/19 196 lb (88.9 kg)    Health  Maintenance Due  Topic Date Due  . COVID-19 Vaccine (1) Never done  . HIV Screening  Never done  . INFLUENZA VACCINE  04/11/2020    There are no preventive care reminders to display for this patient.  Lab Results  Component Value Date   TSH 0.975 11/21/2019   Lab Results  Component Value Date   WBC 2.7 (L) 01/15/2020   HGB 11.2 01/15/2020   HCT 34.9 01/15/2020   MCV 81 01/15/2020   PLT 287 01/15/2020   Lab Results  Component Value Date   NA 141 01/15/2020   K 4.5 01/15/2020   CO2 23 01/15/2020   GLUCOSE 79 01/15/2020   BUN 12 01/15/2020   CREATININE 0.67 01/15/2020   BILITOT 0.2 01/15/2020   ALKPHOS 72 01/15/2020   AST 18 01/15/2020   ALT 14 01/15/2020   PROT 6.9 01/15/2020   ALBUMIN 4.3 01/15/2020   CALCIUM 9.2 01/15/2020   ANIONGAP 8 11/13/2016   Lab Results  Component Value Date   CHOL 189 11/21/2019   Lab Results  Component Value Date   HDL 61 11/21/2019   Lab Results  Component Value Date   LDLCALC 119 (H) 11/21/2019   Lab Results  Component Value Date   TRIG 47 11/21/2019   Lab Results  Component Value Date   CHOLHDL 3.1 11/21/2019   Lab Results  Component Value Date   HGBA1C 5.2 05/28/2020   HGBA1C 5.2 05/28/2020   HGBA1C 5.2 (A) 05/28/2020   HGBA1C 5.2 05/28/2020   Assessment & Plan:   1. Multiple sclerosis (HCC) She will continue to follow up with Neurology.   2. Educated about 2019 novel coronavirus infection  3. Hypertension, unspecified type The current medical regimen is effective; blood pressure is stable at 146/86 today; continue present plan and medications as prescribed. She will continue to take medications as prescribed, to  decrease high sodium intake, excessive alcohol intake, increase potassium intake, smoking cessation, and increase physical activity of at least 30 minutes of cardio activity daily. She will continue to follow Heart Healthy or DASH diet.  4. Weight loss  5. Healthcare maintenance - Urinalysis Dipstick  - POC Glucose (CBG) - POC HgB A1c  6. Follow up She will follow up in 6 months for Annual Physical.  No orders of the defined types were placed in this encounter.   Orders Placed This Encounter  Procedures  . Urinalysis Dipstick  . POC Glucose (CBG)  . POC HgB A1c    Referral Orders  No referral(s) requested today    Raliegh Ip,  MSN, FNP-BC [facility] [address] [place], [place] [address] [phone]- fax  Problem List Items Addressed This Visit      Nervous and Auditory   Multiple sclerosis (HCC) - Primary     Other   Weight loss    Other Visit Diagnoses    Educated about 2019 novel coronavirus infection       Hypertension, unspecified type       Healthcare maintenance       Relevant Orders   Urinalysis Dipstick (Completed)   POC Glucose (CBG) (Completed)   POC HgB A1c (Completed)   Follow up          No orders of the defined types were placed in this encounter.   Follow-up: Return for Labs/OV.    Kallie Locks, FNP

## 2020-06-01 ENCOUNTER — Telehealth: Payer: Self-pay | Admitting: *Deleted

## 2020-06-01 NOTE — Telephone Encounter (Signed)
I called pt and made appt for her at 0945 06-08-20 with SS/NP for RV post steroids MS.

## 2020-06-08 ENCOUNTER — Encounter: Payer: Self-pay | Admitting: Neurology

## 2020-06-08 ENCOUNTER — Ambulatory Visit (INDEPENDENT_AMBULATORY_CARE_PROVIDER_SITE_OTHER): Payer: Medicare HMO | Admitting: Neurology

## 2020-06-08 ENCOUNTER — Telehealth: Payer: Self-pay | Admitting: Neurology

## 2020-06-08 VITALS — BP 127/86 | HR 65 | Ht 63.0 in | Wt 192.2 lb

## 2020-06-08 DIAGNOSIS — R519 Headache, unspecified: Secondary | ICD-10-CM | POA: Diagnosis not present

## 2020-06-08 DIAGNOSIS — G35 Multiple sclerosis: Secondary | ICD-10-CM | POA: Diagnosis not present

## 2020-06-08 MED ORDER — TOPIRAMATE 25 MG PO TABS: ORAL_TABLET | ORAL | 5 refills | Status: DC

## 2020-06-08 NOTE — Patient Instructions (Addendum)
Check blood work today  Let me talk to Dr. Anne Hahn about repeating MRI, starting medication for headache For now, stay on the [facility]  Keep November appointment for now

## 2020-06-08 NOTE — Progress Notes (Signed)
I have read the note, and I agree with the clinical assessment and plan.  Errick Salts K [place]   

## 2020-06-08 NOTE — Telephone Encounter (Signed)
[employer] medicare/medicaid order sent to [facility]. They will obtain the auth and reach out to the patient to schedule.  

## 2020-06-08 NOTE — Progress Notes (Signed)
PATIENT: Teresa Arnold DOB: Oct 03, 1975  REASON FOR VISIT: follow up HISTORY FROM: patient  HISTORY OF PRESENT ILLNESS: Today 06/08/20 Teresa Arnold is a 44 year old female history of multiple sclerosis on Mayzent.  In June 2021, MRI of the brain and cervical spine was overall stable.  In early September 2021, reported left eye pain with side movement, blurry vision, was treated with high-dose oral steroid for possible MS exacerbation, felt the right leg was heavy (this is baseline, but was worse). On Mayzent since 2019.  Today, indicates vision has returned back to normal, still has the heaviness in the leg which is slightly more than baseline.  No falls.  Has pending appointment with ophthalmology.  Since exacerbation, has reported daily headache, bilateral temporal area, throbbing, photophobia.  Relieved with Tylenol, comes back when Tylenol wears off, taking 6-7 tablets daily.  Has history of migraines, initially had headache with initiation of Mayzent, then it went away.  Recently saw PCP, overall health is good.  No signs of infection.  Is interested in getting the Covid vaccine.  Presents today for evaluation unaccompanied.  HISTORY 01/15/2020 SS: Teresa Arnold is a 44 year old female with history of multiple sclerosis.  She remains on Mayzent 2 mg daily.  Initially when starting Mayzent, she developed daily headache, didn't want to start any medication for headache management.  MRI of the brain and cervical spine in June 2019, did not show any definite new lesions in the brain, possible new lesions from 2012, on the spinal cord.  In 2019, she suffered an exacerbation on rebif.  She takes trazodone as needed for sleep, maybe once a month.  She rarely has a headache now, maybe once a month.  Her overall condition is stable, her right leg is chronically weaker.  No falls, but she walks slowly and carefully to prevent this.  She has new glasses, progressive lens.  She is on disability, is at home  with her 3 kids who are doing remote learning.  She saw her PCP a few months ago, B12, vitamin D were low, she is now on supplement for both.  She does some exercise at home, YouTube videos, and has a stationary bike.  Overall feels her condition is stable, no exacerbating symptoms.  REVIEW OF SYSTEMS: Out of a complete 14 system review of symptoms, the patient complains only of the following symptoms, and all other reviewed systems are negative.  Headache, weakness  ALLERGIES: Allergies  Allergen Reactions   Other Shortness Of Breath and Itching    Tampons   Gadolinium Derivatives Nausea And Vomiting    Pt only received half dose of 40ml before getting sick.    Sulfa Antibiotics Itching and Rash   Tysabri [Natalizumab] Rash    HOME MEDICATIONS: Outpatient Medications Prior to Visit  Medication Sig Dispense Refill   acetaminophen (TYLENOL) 325 MG tablet Take 650 mg by mouth every 6 (six) hours as needed for moderate pain.      pantoprazole (PROTONIX) 40 MG tablet TAKE 1 TABLET BY MOUTH EVERY DAY 30 tablet 10   Siponimod Fumarate (MAYZENT) 2 MG TABS Take 1 tablet by mouth daily. 90 tablet 2   valACYclovir (VALTREX) 1000 MG tablet Take 1 tablet (1,000 mg total) by mouth daily. 90 tablet 2   Vitamin D, Ergocalciferol, (DRISDOL) 1.25 MG (50000 UNIT) CAPS capsule Take 1 capsule (50,000 Units total) by mouth every 7 (seven) days. 5 capsule 6   predniSONE (DELTASONE) 50 MG tablet Take 10 tablets daily x  3 days for MS exacerbation 30 tablet 0   traZODone (DESYREL) 50 MG tablet TAKE 1 TABLET (50 MG TOTAL) AT BEDTIME BY MOUTH. (Patient not taking: Reported on 05/28/2020) 90 tablet 0   vitamin B-12 (CYANOCOBALAMIN) 50 MCG tablet Take 1 tablet (50 mcg total) by mouth daily. (Patient not taking: Reported on 05/28/2020) 30 tablet 6   No facility-administered medications prior to visit.    PAST MEDICAL HISTORY: Past Medical History:  Diagnosis Date   Bronchitis    Chronic insomnia  05/22/2017   GERD (gastroesophageal reflux disease)    Iron deficiency anemia 05/22/2017   Morbid obesity (HCC) 05/22/2017   MS (multiple sclerosis) (HCC)    Vitamin B12 deficiency 11/2019   Vitamin D deficiency 11/2019    PAST SURGICAL HISTORY: Past Surgical History:  Procedure Laterality Date   CESAREAN SECTION     x4    FAMILY HISTORY: Family History  Problem Relation Age of Onset   Hypertension Mother    Hypertension Other     SOCIAL HISTORY: Social History   Socioeconomic History   Marital status: Single    Spouse name: Not on file   Number of children: 4   Years of education: 14   Highest education level: Not on file  Occupational History   Not on file  Tobacco Use   Smoking status: Never Smoker   Smokeless tobacco: Never Used  Building services engineer Use: Never used  Substance and Sexual Activity   Alcohol use: Not Currently    Alcohol/week: 0.0 standard drinks    Comment: occasionally   Drug use: No   Sexual activity: Yes    Birth control/protection: None  Other Topics Concern   Not on file  Social History Narrative   Patient is single and lives with her family.   Patient has four children.   Patient has a college education.   Patient is right-handed.   Patient does not drink any caffeine.   Social Determinants of Health   Financial Resource Strain:    Difficulty of Paying Living Expenses: Not on file  Food Insecurity:    Worried About Programme researcher, broadcasting/film/video in the Last Year: Not on file   [employer] of Food in the Last Year: Not on file  Transportation Needs:    Lack of Transportation (Medical): Not on file   Lack of Transportation (Non-Medical): Not on file  Physical Activity:    Days of Exercise per Week: Not on file   Minutes of Exercise per Session: Not on file  Stress:    Feeling of Stress : Not on file  Social Connections:    Frequency of Communication with Friends and Family: Not on file   Frequency of Social  Gatherings with Friends and Family: Not on file   Attends Religious Services: Not on file   Active Member of Clubs or Organizations: Not on file   Attends Banker Meetings: Not on file   Marital Status: Not on file  Intimate Partner Violence:    Fear of Current or Ex-Partner: Not on file   Emotionally Abused: Not on file   Physically Abused: Not on file   Sexually Abused: Not on file   PHYSICAL EXAM  Vitals:   06/08/20 0939  BP: 127/86  Pulse: 65  Weight: 192 lb 3.2 oz (87.2 kg)  Height: 5\' 3"  (1.6 m)   Body mass index is 34.05 kg/m.  Generalized: Well developed, in no acute distress  Neurological examination  Mentation: Alert oriented to time, place, history taking. Follows all commands speech and language fluent Cranial nerve II-XII: Pupils were equal round reactive to light. Extraocular movements were full, visual field were full on confrontational test. Facial sensation and strength were normal. Head turning and shoulder shrug  were normal and symmetric. Motor: Good strength throughout, with exception 3/5 right hip flexion, 4/5 right knee extension/flexion Sensory: Sensory testing is intact to soft touch on all 4 extremities. No evidence of extinction is noted.  Coordination: Cerebellar testing reveals good finger-nose-finger and heel-to-shin bilaterally, but is difficult to lift the right leg. Gait and station: Gait is slightly wide-based, slight limp on the right. Reflexes: Deep tendon reflexes are symmetric and normal bilaterally.   DIAGNOSTIC DATA (LABS, IMAGING, TESTING) - I reviewed patient records, labs, notes, testing and imaging myself where available.  Lab Results  Component Value Date   WBC 2.7 (L) 01/15/2020   HGB 11.2 01/15/2020   HCT 34.9 01/15/2020   MCV 81 01/15/2020   PLT 287 01/15/2020      Component Value Date/Time   NA 141 01/15/2020 1046   K 4.5 01/15/2020 1046   CL 105 01/15/2020 1046   CO2 23 01/15/2020 1046   GLUCOSE  79 01/15/2020 1046   GLUCOSE 86 05/21/2017 1142   BUN 12 01/15/2020 1046   CREATININE 0.67 01/15/2020 1046   CREATININE 0.67 05/21/2017 1142   CALCIUM 9.2 01/15/2020 1046   PROT 6.9 01/15/2020 1046   ALBUMIN 4.3 01/15/2020 1046   AST 18 01/15/2020 1046   ALT 14 01/15/2020 1046   ALKPHOS 72 01/15/2020 1046   BILITOT 0.2 01/15/2020 1046   GFRNONAA 110 01/15/2020 1046   GFRNONAA 111 05/21/2017 1142   GFRAA 126 01/15/2020 1046   GFRAA 128 05/21/2017 1142   Lab Results  Component Value Date   CHOL 189 11/21/2019   HDL 61 11/21/2019   LDLCALC 119 (H) 11/21/2019   TRIG 47 11/21/2019   CHOLHDL 3.1 11/21/2019   Lab Results  Component Value Date   HGBA1C 5.2 05/28/2020   HGBA1C 5.2 05/28/2020   HGBA1C 5.2 (A) 05/28/2020   HGBA1C 5.2 05/28/2020   Lab Results  Component Value Date   VITAMINB12 208 (L) 11/21/2019   Lab Results  Component Value Date   TSH 0.975 11/21/2019    ASSESSMENT AND PLAN 44 y.o. year old female  has a past medical history of Bronchitis, Chronic insomnia (05/22/2017), GERD (gastroesophageal reflux disease), Iron deficiency anemia (05/22/2017), Morbid obesity (HCC) (05/22/2017), MS (multiple sclerosis) (HCC), Vitamin B12 deficiency (11/2019), and Vitamin D deficiency (11/2019). here with:  1.  Relapsing remitting multiple sclerosis, possible MS exacerbation early September 2021 evidenced by left eye pain, blurry vision, increased right leg heaviness 2. Headache   -Responded well to 3-day oral 500 mg prednisone, back to baseline, with exception of daily headache, greater sensation of heaviness to the right leg -Order MRI of the brain with and without contrast given recent exacerbation, and now headache -Check CBC, CMP today on Mayzent -Start Topamax working up to 75 mg at bedtime for headache prevention -She will pursue Covid vaccine -Ordered physical therapy for right leg weakness  -Keep follow-up appointment in November  I spent 30 minutes of face-to-face  and non-face-to-face time with patient.  This included previsit chart review, lab review, study review, order entry, electronic health record documentation, patient education.  Margie Ege, AGNP-C, DNP 06/08/2020, 10:17 AM [facility] [address] [place], [place] [address] [phone]

## 2020-06-09 LAB — CBC WITH DIFFERENTIAL/PLATELET
Basophils Absolute: 0 10*3/uL (ref 0.0–0.2)
Basos: 1 %
EOS (ABSOLUTE): 0 10*3/uL (ref 0.0–0.4)
Eos: 1 %
Hematocrit: 33.4 % — ABNORMAL LOW (ref 34.0–46.6)
Hemoglobin: 10.7 g/dL — ABNORMAL LOW (ref 11.1–15.9)
Immature Grans (Abs): 0.2 10*3/uL — ABNORMAL HIGH (ref 0.0–0.1)
Immature Granulocytes: 5 %
Lymphocytes Absolute: 0.2 10*3/uL — ABNORMAL LOW (ref 0.7–3.1)
Lymphs: 5 %
MCH: 26.5 pg — ABNORMAL LOW (ref 26.6–33.0)
MCHC: 32 g/dL (ref 31.5–35.7)
MCV: 83 fL (ref 79–97)
Monocytes Absolute: 0.6 10*3/uL (ref 0.1–0.9)
Monocytes: 16 %
Neutrophils Absolute: 2.9 10*3/uL (ref 1.4–7.0)
Neutrophils: 72 %
Platelets: 357 10*3/uL (ref 150–450)
RBC: 4.04 x10E6/uL (ref 3.77–5.28)
RDW: 12.6 % (ref 11.7–15.4)
WBC: 4 10*3/uL (ref 3.4–10.8)

## 2020-06-09 LAB — COMPREHENSIVE METABOLIC PANEL
ALT: 36 IU/L — ABNORMAL HIGH (ref 0–32)
AST: 21 IU/L (ref 0–40)
Albumin/Globulin Ratio: 1.5 (ref 1.2–2.2)
Albumin: 3.8 g/dL (ref 3.8–4.8)
Alkaline Phosphatase: 61 IU/L (ref 44–121)
BUN/Creatinine Ratio: 22 (ref 9–23)
BUN: 14 mg/dL (ref 6–24)
Bilirubin Total: 0.3 mg/dL (ref 0.0–1.2)
CO2: 26 mmol/L (ref 20–29)
Calcium: 9 mg/dL (ref 8.7–10.2)
Chloride: 104 mmol/L (ref 96–106)
Creatinine, Ser: 0.65 mg/dL (ref 0.57–1.00)
GFR calc Af Amer: 127 mL/min/{1.73_m2} (ref >59)
GFR calc non Af Amer: 110 mL/min/{1.73_m2} (ref >59)
Globulin, Total: 2.6 g/dL (ref 1.5–4.5)
Glucose: 83 mg/dL (ref 65–99)
Potassium: 4 mmol/L (ref 3.5–5.2)
Sodium: 142 mmol/L (ref 134–144)
Total Protein: 6.4 g/dL (ref 6.0–8.5)

## 2020-06-09 NOTE — Telephone Encounter (Signed)
Order changed.  Thank you.

## 2020-06-09 NOTE — Addendum Note (Signed)
Addended by: Glean Salvo on: 06/09/2020 09:58 AM   Modules accepted: Orders

## 2020-06-09 NOTE — Telephone Encounter (Signed)
Alexus with [facility] called stating the patient is allergic to contrast .. when you get a chance can you switch the order to without contrast thank you!!

## 2020-06-09 NOTE — Telephone Encounter (Signed)
Notes thank you 

## 2020-06-11 DIAGNOSIS — Z23 Encounter for immunization: Secondary | ICD-10-CM | POA: Diagnosis not present

## 2020-06-15 NOTE — Telephone Encounter (Signed)
Noted, thank you. She is scheduled at GI for 06/23/20.

## 2020-06-15 NOTE — Telephone Encounter (Signed)
Her insurance [employer] medicare is not approving the MRI because she just had a MRI in June.  There is an option to do a peer to peer. The phone number is [phone] option 4. The case number is [id number]. It does have to be schedule by 06/18/20.

## 2020-06-15 NOTE — Telephone Encounter (Signed)
I called for peer to peer, apparently was not needed, MRI was approved but with contrast, I then started a new case for MRI brain without, and it was approved, peer to peer not needed.   Order good for 06/15/20-12/12/20 [id number]

## 2020-06-23 ENCOUNTER — Other Ambulatory Visit: Payer: Self-pay

## 2020-06-23 ENCOUNTER — Ambulatory Visit
Admission: RE | Admit: 2020-06-23 | Discharge: 2020-06-23 | Disposition: A | Discharge status: Home / Self Care | Payer: Medicare HMO | Source: Ambulatory Visit | Attending: Neurology | Admitting: Neurology

## 2020-06-23 DIAGNOSIS — G35 Multiple sclerosis: Secondary | ICD-10-CM

## 2020-06-24 ENCOUNTER — Telehealth: Payer: Self-pay | Admitting: Neurology

## 2020-06-24 NOTE — Telephone Encounter (Signed)
-----   Message from Guy Begin, RN sent at 06/24/2020  3:50 PM EDT ----- Maralyn Sago is out can you result note please  thanks  ----- Message ----- From: Asa Lente, MD Sent: 06/24/2020   9:03 AM EDT To: Glean Salvo, NP

## 2020-06-24 NOTE — Telephone Encounter (Signed)
I called the patient.  MRI of the brain is unchanged from prior study done in June.  The patient got good improvement in the vision changes with high-dose oral steroids.  The patient remains on Mayzent.   MRI brain 06/23/20:  IMPRESSION: This MRI of the brain without contrast shows the following: 1.  There are multiple T2/FLAIR hyperintense foci predominantly in the periventricular white matter and also in the brainstem and cerebellum.  The pattern and configuration is consistent with chronic demyelinating plaque associated with multiple sclerosis.  None of the foci appear to be acute.  Compared to the MRI from 02/19/2020, there are no new lesions 2.   The pituitary gland is thinned with and an enlarged sella turcica consistent with an "empty sella".  This could be an incidental finding or due to elevated intracranial pressures.  It is unchanged compared to MRIs from earlier this year and 2018. 3.   There were no acute findings.

## 2020-07-02 DIAGNOSIS — Z23 Encounter for immunization: Secondary | ICD-10-CM | POA: Diagnosis not present

## 2020-07-19 ENCOUNTER — Encounter: Payer: Self-pay | Admitting: Neurology

## 2020-07-19 ENCOUNTER — Ambulatory Visit (INDEPENDENT_AMBULATORY_CARE_PROVIDER_SITE_OTHER): Payer: Medicare HMO | Admitting: Neurology

## 2020-07-19 VITALS — BP 118/72 | Ht 63.0 in | Wt 199.6 lb

## 2020-07-19 DIAGNOSIS — R519 Headache, unspecified: Secondary | ICD-10-CM

## 2020-07-19 DIAGNOSIS — G35 Multiple sclerosis: Secondary | ICD-10-CM | POA: Diagnosis not present

## 2020-07-19 MED ORDER — NORTRIPTYLINE HCL 10 MG PO CAPS: ORAL_CAPSULE | ORAL | 5 refills | Status: DC

## 2020-07-19 NOTE — Progress Notes (Signed)
PATIENT: Teresa Arnold DOB: Sep 07, 1976  REASON FOR VISIT: follow up HISTORY FROM: patient  HISTORY OF PRESENT ILLNESS: Today 07/19/20 Teresa Arnold is a 44 year old female with a history of multiple sclerosis on Mayzent.  In early September 2021, reported left eye pain with side movement, blurry vision, treated with high-dose oral steroid for possible MS exacerbation, felt the right leg was heavy (is baseline but was worse).  Has been on Mayzent since 2019.  Since exacerbation, complained of daily headache.  Sent for MRI of the brain, in October 2021, overall stable was previous in June.  Placed on Topamax 75 mg at bedtime for headache, stopped it due to side effects, too much to keep up with.  Headaches have improved, continues to be daily, are located to the right frontal area, photophobia, phonophobia, will take 2 Tylenol, lay down for about an hour, needs quiet, then goes away.  From MS standpoint, feels overall stable, back to normal.  Was never contacted to set up PT.  No falls, but is careful to prevent.  Here today for follow-up unaccompanied.  HISTORY 06/08/2020 SS: Teresa Arnold is a 44 year old female history of multiple sclerosis on Mayzent.  In June 2021, MRI of the brain and cervical spine was overall stable.  In early September 2021, reported left eye pain with side movement, blurry vision, was treated with high-dose oral steroid for possible MS exacerbation, felt the right leg was heavy (this is baseline, but was worse). On Mayzent since 2019.  Today, indicates vision has returned back to normal, still has the heaviness in the leg which is slightly more than baseline.  No falls.  Has pending appointment with ophthalmology.  Since exacerbation, has reported daily headache, bilateral temporal area, throbbing, photophobia.  Relieved with Tylenol, comes back when Tylenol wears off, taking 6-7 tablets daily.  Has history of migraines, initially had headache with initiation of Mayzent,  then it went away.  Recently saw PCP, overall health is good.  No signs of infection.  Is interested in getting the Covid vaccine.  Presents today for evaluation unaccompanied.   REVIEW OF SYSTEMS: Out of a complete 14 system review of symptoms, the patient complains only of the following symptoms, and all other reviewed systems are negative. Headache  ALLERGIES: Allergies  Allergen Reactions  . Other Shortness Of Breath and Itching    Tampons  . Gadolinium Derivatives Nausea And Vomiting    Pt only received half dose of 18ml before getting sick.   . Sulfa Antibiotics Itching and Rash  . Tysabri [Natalizumab] Rash    HOME MEDICATIONS: Outpatient Medications Prior to Visit  Medication Sig Dispense Refill  . acetaminophen (TYLENOL) 325 MG tablet Take 650 mg by mouth every 6 (six) hours as needed for moderate pain.     . pantoprazole (PROTONIX) 40 MG tablet TAKE 1 TABLET BY MOUTH EVERY DAY 30 tablet 10  . Siponimod Fumarate (MAYZENT) 2 MG TABS Take 1 tablet by mouth daily. 90 tablet 2  . valACYclovir (VALTREX) 1000 MG tablet Take 1 tablet (1,000 mg total) by mouth daily. 90 tablet 2  . Vitamin D, Ergocalciferol, (DRISDOL) 1.25 MG (50000 UNIT) CAPS capsule Take 1 capsule (50,000 Units total) by mouth every 7 (seven) days. 5 capsule 6  . topiramate (TOPAMAX) 25 MG tablet Take 1 tablet at bedtime x 3 nights, then take 2 tablets x 3 nights, then take 3 tablets at bedtime 120 tablet 5   No facility-administered medications prior to visit.  PAST MEDICAL HISTORY: Past Medical History:  Diagnosis Date  . Bronchitis   . Chronic insomnia 05/22/2017  . GERD (gastroesophageal reflux disease)   . Iron deficiency anemia 05/22/2017  . Morbid obesity (HCC) 05/22/2017  . MS (multiple sclerosis) (HCC)   . Vitamin B12 deficiency 11/2019  . Vitamin D deficiency 11/2019    PAST SURGICAL HISTORY: Past Surgical History:  Procedure Laterality Date  . CESAREAN SECTION     x4    FAMILY  HISTORY: Family History  Problem Relation Age of Onset  . Hypertension Mother   . Hypertension Other     SOCIAL HISTORY: Social History   Socioeconomic History  . Marital status: Single    Spouse name: Not on file  . Number of children: 4  . Years of education: 16  . Highest education level: Not on file  Occupational History  . Not on file  Tobacco Use  . Smoking status: Never Smoker  . Smokeless tobacco: Never Used  Vaping Use  . Vaping Use: Never used  Substance and Sexual Activity  . Alcohol use: Not Currently    Alcohol/week: 0.0 standard drinks    Comment: occasionally  . Drug use: No  . Sexual activity: Yes    Birth control/protection: None  Other Topics Concern  . Not on file  Social History Narrative   Patient is single and lives with her family.   Patient has four children.   Patient has a college education.   Patient is right-handed.   Patient does not drink any caffeine.   Social Determinants of Health   Financial Resource Strain:   . Difficulty of Paying Living Expenses: Not on file  Food Insecurity:   . Worried About Programme researcher, broadcasting/film/video in the Last Year: Not on file  . Ran Out of Food in the Last Year: Not on file  Transportation Needs:   . Lack of Transportation (Medical): Not on file  . Lack of Transportation (Non-Medical): Not on file  Physical Activity:   . Days of Exercise per Week: Not on file  . Minutes of Exercise per Session: Not on file  Stress:   . Feeling of Stress : Not on file  Social Connections:   . Frequency of Communication with Friends and Family: Not on file  . Frequency of Social Gatherings with Friends and Family: Not on file  . Attends Religious Services: Not on file  . Active Member of Clubs or Organizations: Not on file  . Attends Banker Meetings: Not on file  . Marital Status: Not on file  Intimate Partner Violence:   . Fear of Current or Ex-Partner: Not on file  . Emotionally Abused: Not on file  .  Physically Abused: Not on file  . Sexually Abused: Not on file      PHYSICAL EXAM  Vitals:   07/19/20 1007  BP: 118/72  Weight: 199 lb 9.6 oz (90.5 kg)  Height: 5\' 3"  (1.6 m)   Body mass index is 35.36 kg/m.  Generalized: Well developed, in no acute distress   Neurological examination  Mentation: Alert oriented to time, place, history taking. Follows all commands speech and language fluent Cranial nerve II-XII: Pupils were equal round reactive to light. Extraocular movements were full, visual field were full on confrontational test. Facial sensation and strength were normal. Head turning and shoulder shrug  were normal and symmetric. Motor: Good strength, exception 3/5 right hip flexion, 4/5 right knee extension/flexion Sensory: Sensory testing is intact  to soft touch on all 4 extremities. No evidence of extinction is noted.  Coordination: Cerebellar testing reveals good finger-nose-finger and heel-to-shin bilaterally, but difficulty lifting the right leg Gait and station: Gait is wide-based, limp on the right, drag right foot Reflexes: Deep tendon reflexes are symmetric but somewhat increased throughout  DIAGNOSTIC DATA (LABS, IMAGING, TESTING) - I reviewed patient records, labs, notes, testing and imaging myself where available.  Lab Results  Component Value Date   WBC 4.0 06/08/2020   HGB 10.7 (L) 06/08/2020   HCT 33.4 (L) 06/08/2020   MCV 83 06/08/2020   PLT 357 06/08/2020      Component Value Date/Time   NA 142 06/08/2020 1018   K 4.0 06/08/2020 1018   CL 104 06/08/2020 1018   CO2 26 06/08/2020 1018   GLUCOSE 83 06/08/2020 1018   GLUCOSE 86 05/21/2017 1142   BUN 14 06/08/2020 1018   CREATININE 0.65 06/08/2020 1018   CREATININE 0.67 05/21/2017 1142   CALCIUM 9.0 06/08/2020 1018   PROT 6.4 06/08/2020 1018   ALBUMIN 3.8 06/08/2020 1018   AST 21 06/08/2020 1018   ALT 36 (H) 06/08/2020 1018   ALKPHOS 61 06/08/2020 1018   BILITOT 0.3 06/08/2020 1018   GFRNONAA  110 06/08/2020 1018   GFRNONAA 111 05/21/2017 1142   GFRAA 127 06/08/2020 1018   GFRAA 128 05/21/2017 1142   Lab Results  Component Value Date   CHOL 189 11/21/2019   HDL 61 11/21/2019   LDLCALC 119 (H) 11/21/2019   TRIG 47 11/21/2019   CHOLHDL 3.1 11/21/2019   Lab Results  Component Value Date   HGBA1C 5.2 05/28/2020   HGBA1C 5.2 05/28/2020   HGBA1C 5.2 (A) 05/28/2020   HGBA1C 5.2 05/28/2020   Lab Results  Component Value Date   VITAMINB12 208 (L) 11/21/2019   Lab Results  Component Value Date   TSH 0.975 11/21/2019    ASSESSMENT AND PLAN 44 y.o. year old female  has a past medical history of Bronchitis, Chronic insomnia (05/22/2017), GERD (gastroesophageal reflux disease), Iron deficiency anemia (05/22/2017), Morbid obesity (HCC) (05/22/2017), MS (multiple sclerosis) (HCC), Vitamin B12 deficiency (11/2019), and Vitamin D deficiency (11/2019). here with:  1.  Relapsing remitting multiple sclerosis, possible MS exacerbation early September 2021 evidenced by left eye pain, blurry vision, increased right leg heaviness  2. Headache   -Responded well to 3-day oral 500 mg prednisone treatment, back to baseline, continues with daily headache, but has improved, continues with heaviness sensation to the right leg that is baseline  -MRI of the brain without contrast was overall stable  -Add on nortriptyline working up to 20 mg at bedtime for headache prevention, does have migraine features, could not tolerate Topamax, history of migraines  -Referral to ophthalmology, on Mayzent, also MRI mentions empty sella, could be incidental finding or due to elevated intracranial pressure, has been unchanged since 2018, make sure no papilledema  -Will send for PT again, neuro rehab, she will contact them also   -Will follow-up in 4 months or sooner if needed, will check blood work then  I spent 30 minutes of face-to-face and non-face-to-face time with patient.  This included previsit chart  review, lab review, study review, order entry, electronic health record documentation, patient education.  Margie Ege, AGNP-C, DNP 07/19/2020, 10:39 AM [facility] [address], [address] [place], [place] [address] [phone]

## 2020-07-19 NOTE — Patient Instructions (Addendum)
Start nortriptyline 10 mg at bedtime x 1 week, then take 20 mg at bedtime, stay at this dosing for headache prevention  Referral to physical therapy for gait and balance training  See you back in 4 months check blood work  Referral placed to eye doctor   Nortriptyline capsules What is this medicine? NORTRIPTYLINE (nor TRIP ti leen) is used to treat depression. This medicine may be used for other purposes; ask your health care provider or pharmacist if you have questions. COMMON BRAND NAME(S): Aventyl, Pamelor What should I tell my health care provider before I take this medicine? They need to know if you have any of these conditions:  bipolar disorder  Brugada syndrome  difficulty passing urine  glaucoma  heart disease  if you drink alcohol  liver disease  schizophrenia  seizures  suicidal thoughts, plans or attempt; a previous suicide attempt by you or a family member  thyroid disease  an unusual or allergic reaction to nortriptyline, other tricyclic antidepressants, other medicines, foods, dyes, or preservatives  pregnant or trying to get pregnant  breast-feeding How should I use this medicine? Take this medicine by mouth with a glass of water. Follow the directions on the prescription label. Take your doses at regular intervals. Do not take it more often than directed. Do not stop taking this medicine suddenly except upon the advice of your doctor. Stopping this medicine too quickly may cause serious side effects or your condition may worsen. A special MedGuide will be given to you by the pharmacist with each prescription and refill. Be sure to read this information carefully each time. Talk to your pediatrician regarding the use of this medicine in children. Special care may be needed. Overdosage: If you think you have taken too much of this medicine contact a poison control center or emergency room at once. NOTE: This medicine is only for you. Do not share this  medicine with others. What if I miss a dose? If you miss a dose, take it as soon as you can. If it is almost time for your next dose, take only that dose. Do not take double or extra doses. What may interact with this medicine? Do not take this medicine with any of the following medications:  cisapride  dronedarone  linezolid  MAOIs like Carbex, Eldepryl, Marplan, Nardil, and Parnate  methylene blue (injected into a vein)  pimozide  thioridazine This medicine may also interact with the following medications:  alcohol  antihistamines for allergy, cough, and cold  atropine  certain medicines for bladder problems like oxybutynin, tolterodine  certain medicines for depression like amitriptyline, fluoxetine, sertraline  certain medicines for Parkinson's disease like benztropine, trihexyphenidyl  certain medicines for stomach problems like dicyclomine, hyoscyamine  certain medicines for travel sickness like scopolamine  chlorpropamide  cimetidine  ipratropium  other medicines that prolong the QT interval (an abnormal heart rhythm) like dofetilide  other medicines that can cause serotonin syndrome like St. John's Wort, fentanyl, lithium, tramadol, tryptophan, buspirone, and some medicines for headaches like sumatriptan or rizatriptan  quinidine  reserpine  thyroid medicine This list may not describe all possible interactions. Give your health care provider a list of all the medicines, herbs, non-prescription drugs, or dietary supplements you use. Also tell them if you smoke, drink alcohol, or use illegal drugs. Some items may interact with your medicine. What should I watch for while using this medicine? Tell your doctor if your symptoms do not get better or if they get worse. Visit  your doctor or health care professional for regular checks on your progress. Because it may take several weeks to see the full effects of this medicine, it is important to continue your  treatment as prescribed by your doctor. Patients and their families should watch out for new or worsening thoughts of suicide or depression. Also watch out for sudden changes in feelings such as feeling anxious, agitated, panicky, irritable, hostile, aggressive, impulsive, severely restless, overly excited and hyperactive, or not being able to sleep. If this happens, especially at the beginning of treatment or after a change in dose, call your health care professional. Bonita Quin may get drowsy or dizzy. Do not drive, use machinery, or do anything that needs mental alertness until you know how this medicine affects you. Do not stand or sit up quickly, especially if you are an older patient. This reduces the risk of dizzy or fainting spells. Alcohol may interfere with the effect of this medicine. Avoid alcoholic drinks. Do not treat yourself for coughs, colds, or allergies without asking your doctor or health care professional for advice. Some ingredients can increase possible side effects. Your mouth may get dry. Chewing sugarless gum or sucking hard candy, and drinking plenty of water may help. Contact your doctor if the problem does not go away or is severe. This medicine may cause dry eyes and blurred vision. If you wear contact lenses you may feel some discomfort. Lubricating drops may help. See your eye doctor if the problem does not go away or is severe. This medicine can cause constipation. Try to have a bowel movement at least every 2 to 3 days. If you do not have a bowel movement for 3 days, call your doctor or health care professional. This medicine can make you more sensitive to the sun. Keep out of the sun. If you cannot avoid being in the sun, wear protective clothing and use sunscreen. Do not use sun lamps or tanning beds/booths. What side effects may I notice from receiving this medicine? Side effects that you should report to your doctor or health care professional as soon as possible:  allergic  reactions like skin rash, itching or hives, swelling of the face, lips, or tongue  anxious  breathing problems  changes in vision  confusion  elevated mood, decreased need for sleep, racing thoughts, impulsive behavior  eye pain  fast, irregular heartbeat  feeling faint or lightheaded, falls  feeling agitated, angry, or irritable  fever with increased sweating  hallucination, loss of contact with reality  seizures  stiff muscles  suicidal thoughts or other mood changes  tingling, pain, or numbness in the feet or hands  trouble passing urine or change in the amount of urine  trouble sleeping  unusually weak or tired  vomiting  yellowing of the eyes or skin Side effects that usually do not require medical attention (report to your doctor or health care professional if they continue or are bothersome):  change in sex drive or performance  change in appetite or weight  constipation  dizziness  dry mouth  nausea  tired  tremors  upset stomach This list may not describe all possible side effects. Call your doctor for medical advice about side effects. You may report side effects to FDA at 1-800-FDA-1088. Where should I keep my medicine? Keep out of the reach of children. Store at room temperature between 15 and 30 degrees C (59 and 86 degrees F). Keep container tightly closed. Throw away any unused medicine after the expiration  date. NOTE: This sheet is a summary. It may not cover all possible information. If you have questions about this medicine, talk to your doctor, pharmacist, or health care provider.  2020 Elsevier/Gold Standard (2018-08-20 13:24:58)

## 2020-07-19 NOTE — Progress Notes (Signed)
I have read the note, and I agree with the clinical assessment and plan.  Joellen Tullos K Kayly Kriegel   

## 2020-08-02 ENCOUNTER — Ambulatory Visit: Payer: Medicare HMO | Attending: Neurology | Admitting: Physical Therapy

## 2020-08-02 ENCOUNTER — Other Ambulatory Visit: Payer: Self-pay

## 2020-08-02 ENCOUNTER — Encounter: Payer: Self-pay | Admitting: Physical Therapy

## 2020-08-02 DIAGNOSIS — R2689 Other abnormalities of gait and mobility: Secondary | ICD-10-CM | POA: Diagnosis not present

## 2020-08-02 DIAGNOSIS — M6281 Muscle weakness (generalized): Secondary | ICD-10-CM | POA: Insufficient documentation

## 2020-08-02 DIAGNOSIS — R29818 Other symptoms and signs involving the nervous system: Secondary | ICD-10-CM | POA: Diagnosis not present

## 2020-08-02 DIAGNOSIS — R2681 Unsteadiness on feet: Secondary | ICD-10-CM | POA: Insufficient documentation

## 2020-08-02 DIAGNOSIS — R262 Difficulty in walking, not elsewhere classified: Secondary | ICD-10-CM | POA: Diagnosis not present

## 2020-08-02 NOTE — Therapy (Signed)
[facility] [address] [place], [place], [address] Phone: [phone]   Fax:  [phone]  Physical Therapy Evaluation  Patient Details  Name: Teresa Arnold MRN: [id number] Date of Birth: 10/10/1975 Referring Provider (PT): Glean Salvo, NP   Encounter Date: 08/02/2020   PT End of Session - 08/02/20 2234    Visit Number 1    Number of Visits 17    Date for PT Re-Evaluation 10/31/20   60 day POC   Authorization Type [employer]    PT Start Time 0935    PT Stop Time 1014    PT Time Calculation (min) 39 min    Equipment Utilized During Treatment Gait belt    Activity Tolerance Patient tolerated treatment well    Behavior During Therapy [facility] for tasks assessed/performed           Past Medical History:  Diagnosis Date  . Bronchitis   . Chronic insomnia 05/22/2017  . GERD (gastroesophageal reflux disease)   . Iron deficiency anemia 05/22/2017  . Morbid obesity (HCC) 05/22/2017  . MS (multiple sclerosis) (HCC)   . Vitamin B12 deficiency 11/2019  . Vitamin D deficiency 11/2019    Past Surgical History:  Procedure Laterality Date  . CESAREAN SECTION     x4    There were no vitals filed for this visit.    Subjective Assessment - 08/02/20 0938    Subjective Relapsing remitting multiple sclerosis, possible MS exacerbation early September 2021 evidenced by left eye pain, blurry vision, increased right leg heaviness. States that R leg has still  been feeling heavier - no longer having eye pain or blurry vision. When she gets tired while walking, notices that her R leg will drag a little bit. No recent falls. Can't exercise like she used to.    Pertinent History PMH: MS (diagnosed in 2012), GERD    Limitations Walking;Standing    How long can you walk comfortably? probably 10-20 minutes.    Patient Stated Goals wants to be able to lift her R leg up better.    Currently in Pain? No/denies               [facility] PT Assessment - 08/02/20 3500      Assessment   Medical Diagnosis MS    Referring Provider (PT) Glean Salvo, NP    Onset Date/Surgical Date 07/19/20   date of referral, diagnosed in 2012   Hand Dominance Right    Prior Therapy previous PT [facility] [address] for L ankle fracture in 2019      Precautions   Precautions Fall      Balance Screen   Has the patient fallen in the past 6 months No    Has the patient had a decrease in activity level because of a fear of falling?  Yes    Is the patient reluctant to leave their home because of a fear of falling?  No      Home [employer] information [employer] residence    Living Arrangements Children;Other (Comment)   3 children; 10, 11, 13   Available Help at Discharge Family    Type of Home House    Home Access Level entry    Home Layout One level   1 step to get into living room   Home Equipment Cane - single point   3 prong SPC    Additional Comments uses 3 prong SPC when more fatigued  Prior Function   Level of Independence Independent    Vocation On disability    Leisure wants to get back to exercising       Sensation   Light Touch Appears Intact      Coordination   Gross Motor Movements are Fluid and Coordinated No    Heel Shin Test [facility] with LLE, more slowed movement with RLE      ROM / Strength   AROM / PROM / Strength Strength      Strength   Strength Assessment Site Hip;Knee;Ankle    Right/Left Hip Right;Left    Right Hip Flexion 3-/5    Right Hip Extension 3-/5    Right Hip ABduction 3/5    Left Hip Flexion 4-/5    Left Hip Extension 3-/5    Left Hip ABduction 4-/5    Right/Left Knee Right;Left    Right Knee Flexion 3+/5    Right Knee Extension 4+/5    Left Knee Flexion 5/5    Left Knee Extension 5/5    Right/Left Ankle Right;Left    Right Ankle Dorsiflexion 4+/5    Left Ankle Dorsiflexion 5/5      Transfers   Transfers Sit to Stand;Stand to Sit    Sit to Stand 5:  Supervision;Without upper extremity assist;From chair/3-in-1    Five time sit to stand comments  25.06 seconds, performing with hands on thighs    Stand to Sit 5: Supervision;Without upper extremity assist;To chair/3-in-1      Ambulation/Gait   Ambulation/Gait Yes    Ambulation/Gait Assistance 5: Supervision    Assistive device None    Gait Pattern Step-through pattern;Decreased stance time - right;Lateral hip instability;Decreased weight shift to right    Ambulation Surface Indoor;Level    Gait velocity 11.17 seconds = 2.93 ft/sec      Functional Gait  Assessment   Gait assessed  Yes    Gait Level Surface Walks 20 ft in less than 7 sec but greater than 5.5 sec, uses assistive device, slower speed, mild gait deviations, or deviates 6-10 in outside of the 12 in walkway width.   6.94   Change in Gait Speed Able to smoothly change walking speed without loss of balance or gait deviation. Deviate no more than 6 in outside of the 12 in walkway width.    Gait with Horizontal Head Turns Performs head turns with moderate changes in gait velocity, slows down, deviates 10-15 in outside 12 in walkway width but recovers, can continue to walk.    Gait with Vertical Head Turns Performs task with severe disruption of gait (eg, staggers 15 in outside 12 in walkway width, loses balance, stops, reaches for wall).    Gait and Pivot Turn Turns slowly, requires verbal cueing, or requires several small steps to catch balance following turn and stop    Step Over Obstacle Is able to step over one shoe box (4.5 in total height) but must slow down and adjust steps to clear box safely. May require verbal cueing.    Gait with Narrow Base of Support Ambulates less than 4 steps heel to toe or cannot perform without assistance.    Gait with Eyes Closed Cannot walk 20 ft without assistance, severe gait deviations or imbalance, deviates greater than 15 in outside 12 in walkway width or will not attempt task.    Ambulating  Backwards Walks 20 ft, slow speed, abnormal gait pattern, evidence for imbalance, deviates 10-15 in outside 12 in walkway width.   decr  stance time RLE   Steps Alternating feet, must use rail.    Total Score 11    FGA comment: 11/30                      Objective measurements completed on examination: See above findings.               PT Education - 08/02/20 2234    Education Details clinical findings, fall risk, POC    Person(s) Educated Patient    Methods Explanation    Comprehension Verbalized understanding            PT Short Term Goals - 08/03/20 0904      PT SHORT TERM GOAL #1   Title Pt will be independent with initial HEP in order to build upon functional gains made in therapy. ALL STGS DUE 08/31/20    Time 4    Period Weeks    Status New    Target Date 08/31/20      PT SHORT TERM GOAL #2   Title Pt will improve FGA score to at least a 14/30 in order to demo decr fall risk.    Baseline 11/30    Time 4    Period Weeks    Status New      PT SHORT TERM GOAL #3   Title Pt will undergo vs. with LTG written as appropriate.    Time 4    Period Weeks    Status New      PT SHORT TERM GOAL #4   Title Pt will decr 5x sit <> stand time with no UE support to 20 seconds or less in order to demo improved functional BLE strength.    Baseline 25.06 seconds    Time 4    Period Weeks    Status New             PT Long Term Goals - 08/03/20 6720      PT LONG TERM GOAL #1   Title Pt will be independent with final HEP in order to build upon functional gains made in therapy. ALL LTGS DUE 09/18/20    Time 8    Period Weeks    Status New    Target Date 09/28/20      PT LONG TERM GOAL #2   Title Pt will improve gait speed to at least 3.3 ft/sec for improve gait efficiency.    Baseline 2.93 ft/sec    Time 8    Period Weeks    Status New      PT LONG TERM GOAL #3   Title vs. goal to be written as appropriate to improve walking  endurance in the community.    Time 8    Period Weeks    Status New      PT LONG TERM GOAL #4   Title Pt will improve FGA score to at least a 17/30 in order to demo decr fall risk.    Baseline 11/30    Time 8    Period Weeks    Status New      PT LONG TERM GOAL #5   Title Pt will ambulate at least 300' outdoors with supervision with no AD vs. LRAD in order to demo improved community mobility.    Time 8    Period Weeks    Status New  Plan - 08/03/20 0910    Clinical Impression Statement Patient is a 44 year old female referred to Neuro OPPT for MS. Pt with a possible MS exacerbation early September 2021 evidenced by left eye pain, blurry vision, increased right leg heaviness. Pt reports left eye pain/blurry vision is gone, but is still having incr R leg heaviness. Pt's PMH is significant for: relapsing remitting MS, GERD. The following deficits were present during the exam:  decr RLE coordination, gait abnormalities, balance impairments, decr RLE>LLE strength, decr endurance. Pt reporting that sometimes that she feels dizzy, however none noted during today's session.  Pt ambulating in session today with no AD, but pt reports uses a 3 prong SPC at times for community mobility. Based on 5x sit <> stand and FGA, pt is at a high risk for falls. Pt would benefit from skilled PT to address these impairments and functional limitations to maximize functional mobility independence    Personal Factors and Comorbidities Comorbidity 2;Time since onset of injury/illness/exacerbation;Past/Current Experience    Comorbidities PMH: MS, GERD    Examination-Activity Limitations Locomotion Level;Stand;Stairs;Transfers    Examination-Participation Restrictions Community Activity   exercising   Stability/Clinical Decision Making Stable/Uncomplicated    Clinical Decision Making Low    Rehab Potential Good    PT Frequency 2x / week    PT Duration 8 weeks    PT Treatment/Interventions  ADLs/Self Care Home Management;DME Instruction;Gait training;Stair training;Functional mobility training;Therapeutic activities;Neuromuscular re-education;Balance training;Therapeutic exercise;Patient/family education;Orthotic Fit/Training;Passive range of motion;Vestibular;Energy conservation    PT Next Visit Plan initial HEP for R>LLE strengthening/standing balance. perform vs. with LTG to be written as appropriate. Scifit/nustep.    Consulted and Agree with Plan of Care Patient           Patient will benefit from skilled therapeutic intervention in order to improve the following deficits and impairments:  Abnormal gait, Decreased activity tolerance, Decreased balance, Decreased coordination, Decreased endurance, Decreased strength, Difficulty walking, Dizziness  Visit Diagnosis: Unsteadiness on feet  Difficulty in walking, not elsewhere classified  Muscle weakness (generalized)  Other symptoms and signs involving the nervous system  Other abnormalities of gait and mobility     Problem List Patient Active Problem List   Diagnosis Date Noted  . Vitamin D deficiency 11/23/2019  . Weight loss 11/23/2019  . Headache 07/17/2019  . Class 3 severe obesity due to excess calories with serious comorbidity and body mass index (BMI) of 40.0 to 44.9 in adult (HCC) 11/01/2018  . Frequency of urination 11/01/2018  . Dysuria 11/01/2018  . Possible exposure to STD 11/01/2018  . Urinary tract infection without hematuria 11/01/2018  . Chronic insomnia 05/22/2017  . Iron deficiency anemia 05/22/2017  . Class 1 obesity due to excess calories with serious comorbidity and body mass index (BMI) of 33.0 to 33.9 in adult 05/22/2017  . Acid reflux 12/04/2016  . 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 Multiple sclerosis (HCC) 02/13/2013    Drake Leach, PT, DPT 08/03/2020, 9:25 AM  [facility] [address] [place], [place],  [address] Phone: [phone]   Fax:  [phone]  Name: JECENIA LEAMER MRN: [id number] Date of Birth: [birth year]

## 2020-08-12 ENCOUNTER — Ambulatory Visit: Payer: Medicare HMO | Admitting: Physical Therapy

## 2020-08-13 ENCOUNTER — Ambulatory Visit: Payer: Medicare HMO | Admitting: Physical Therapy

## 2020-08-17 ENCOUNTER — Ambulatory Visit: Payer: Medicare HMO | Attending: Neurology | Admitting: Physical Therapy

## 2020-08-17 ENCOUNTER — Other Ambulatory Visit: Payer: Self-pay | Admitting: Neurology

## 2020-08-17 ENCOUNTER — Encounter: Payer: Self-pay | Admitting: Physical Therapy

## 2020-08-17 ENCOUNTER — Other Ambulatory Visit: Payer: Self-pay

## 2020-08-17 DIAGNOSIS — M6281 Muscle weakness (generalized): Secondary | ICD-10-CM | POA: Diagnosis not present

## 2020-08-17 DIAGNOSIS — R262 Difficulty in walking, not elsewhere classified: Secondary | ICD-10-CM | POA: Insufficient documentation

## 2020-08-17 DIAGNOSIS — R2681 Unsteadiness on feet: Secondary | ICD-10-CM

## 2020-08-17 DIAGNOSIS — R29818 Other symptoms and signs involving the nervous system: Secondary | ICD-10-CM

## 2020-08-17 NOTE — Patient Instructions (Signed)
Access Code: [email or web address] URL: [email or web address] Date: 08/17/2020 Prepared by: Sherlie Ban  Exercises Supine Bridge - 1 x daily - 5 x weekly - 2 sets - 6-8 reps Supine March - 2 x daily - 5 x weekly - 2 sets - 5 reps Bent Knee Fallouts - 1-2 x daily - 5 x weekly - 2 sets - 10 reps Side Stepping with Counter Support - 2 x daily - 5 x weekly - 4 sets Standing Balance in Corner with Eyes Closed - 2 x daily - 5 x weekly - 3 sets - 20 hold Standing with Head Rotation - 2 x daily - 5 x weekly - 2-3 sets - 5 reps

## 2020-08-17 NOTE — Therapy (Signed)
[facility] [address] [place], [place], [address] Phone: [phone]   Fax:  [phone]  Physical Therapy Treatment  Patient Details  Name: Teresa Arnold MRN: [id number] Date of Birth: [birth year] Referring Provider (PT): Glean Salvo, NP   Encounter Date: 08/17/2020   PT End of Session - 08/17/20 1340    Visit Number 2    Number of Visits 17    Date for PT Re-Evaluation 10/31/20   60 day POC   Authorization Type [employer]    PT Start Time 1154    PT Stop Time 1234    PT Time Calculation (min) 40 min    Equipment Utilized During Treatment Gait belt    Activity Tolerance Patient tolerated treatment well    Behavior During Therapy [facility] for tasks assessed/performed           Past Medical History:  Diagnosis Date  . Bronchitis   . Chronic insomnia 05/22/2017  . GERD (gastroesophageal reflux disease)   . Iron deficiency anemia 05/22/2017  . Morbid obesity (HCC) 05/22/2017  . MS (multiple sclerosis) (HCC)   . Vitamin B12 deficiency 11/2019  . Vitamin D deficiency 11/2019    Past Surgical History:  Procedure Laterality Date  . CESAREAN SECTION     x4    There were no vitals filed for this visit.   Subjective Assessment - 08/17/20 1156    Subjective No changes since she was last here. R leg still feeling heavy.    Pertinent History PMH: MS (diagnosed in 2012), GERD    Limitations Walking;Standing    How long can you walk comfortably? probably 10-20 minutes.    Patient Stated Goals wants to be able to lift her R leg up better.    Currently in Pain? No/denies                        Access Code: [email or web address] URL: [email or web address] Date: 08/17/2020 Prepared by: Sherlie Ban  Initiated HEP - see MedBridge for more details. Pt needing cues for proper technique as well as cues to keep a soft bend in the knee for standing balance to prevent B genu recurvatum    Exercises Supine Bridge - 1 x daily - 5 x weekly - 2 sets - 6-8 reps Supine March - 2 x daily - 5 x weekly - 2 sets - 5 reps - with red tband  Bent Knee Fallouts - 1-2 x daily - 5 x weekly - 2 sets - 10 reps - with red tband, cues for slowed and controlled  Side Stepping with Counter Support - 2 x daily - 5 x weekly - 4 sets - cues for foot clearance and proper technique  Standing Balance in Corner with Eyes Closed - 2 x daily - 5 x weekly - 3 sets - 20 hold - intermittent touch to chair for balance as needed.  Standing with Head Rotation - 2 x daily - 5 x weekly - 2-3 sets - 5 reps, with feet more narrow 2 x5 reps head turns, 2 x 5 reps head nods              PT Education - 08/17/20 1339    Education Details initial HEP    Person(s) Educated Patient    Methods Explanation;Demonstration;Verbal cues;Handout    Comprehension Verbalized understanding;Returned demonstration;Verbal cues required            PT Short Term Goals - 08/03/20  7322      PT SHORT TERM GOAL #1   Title Pt will be independent with initial HEP in order to build upon functional gains made in therapy. ALL STGS DUE 08/31/20    Time 4    Period Weeks    Status New    Target Date 08/31/20      PT SHORT TERM GOAL #2   Title Pt will improve FGA score to at least a 14/30 in order to demo decr fall risk.    Baseline 11/30    Time 4    Period Weeks    Status New      PT SHORT TERM GOAL #3   Title Pt will undergo vs. with LTG written as appropriate.    Time 4    Period Weeks    Status New      PT SHORT TERM GOAL #4   Title Pt will decr 5x sit <> stand time with no UE support to 20 seconds or less in order to demo improved functional BLE strength.    Baseline 25.06 seconds    Time 4    Period Weeks    Status New             PT Long Term Goals - 08/03/20 0254      PT LONG TERM GOAL #1   Title Pt will be independent with final HEP in order to build upon functional gains made in  therapy. ALL LTGS DUE 09/18/20    Time 8    Period Weeks    Status New    Target Date 09/28/20      PT LONG TERM GOAL #2   Title Pt will improve gait speed to at least 3.3 ft/sec for improve gait efficiency.    Baseline 2.93 ft/sec    Time 8    Period Weeks    Status New      PT LONG TERM GOAL #3   Title vs. goal to be written as appropriate to improve walking endurance in the community.    Time 8    Period Weeks    Status New      PT LONG TERM GOAL #4   Title Pt will improve FGA score to at least a 17/30 in order to demo decr fall risk.    Baseline 11/30    Time 8    Period Weeks    Status New      PT LONG TERM GOAL #5   Title Pt will ambulate at least 300' outdoors with supervision with no AD vs. LRAD in order to demo improved community mobility.    Time 8    Period Weeks    Status New                 Plan - 08/17/20 1352    Clinical Impression Statement Today's skilled session focused on initiating HEP for BLE strengthening and standing balance. Pt challenged by wide BOS and eyes closed, needing intermittent touch to chair for balance, indicating incr reliance of vision for balance. Cues throughout session to keep a "soft" knee in order to prevent genu recurvatum. Will continue to progress towards LTGs.    Personal Factors and Comorbidities Comorbidity 2;Time since onset of injury/illness/exacerbation;Past/Current Experience    Comorbidities PMH: MS, GERD    Examination-Activity Limitations Locomotion Level;Stand;Stairs;Transfers    Examination-Participation Restrictions Community Activity   exercising   Stability/Clinical Decision Making Stable/Uncomplicated    Rehab Potential  Good    PT Frequency 2x / week    PT Duration 8 weeks    PT Treatment/Interventions ADLs/Self Care Home Management;DME Instruction;Gait training;Stair training;Functional mobility training;Therapeutic activities;Neuromuscular re-education;Balance training;Therapeutic  exercise;Patient/family education;Orthotic Fit/Training;Passive range of motion;Vestibular;Energy conservation    PT Next Visit Plan how is HEP? keep working on balance/ RLE>LLE strengthening. perform vs. with LTG to be written as appropriate. Scifit/nustep.    Consulted and Agree with Plan of Care Patient           Patient will benefit from skilled therapeutic intervention in order to improve the following deficits and impairments:  Abnormal gait, Decreased activity tolerance, Decreased balance, Decreased coordination, Decreased endurance, Decreased strength, Difficulty walking, Dizziness  Visit Diagnosis: Unsteadiness on feet  Difficulty in walking, not elsewhere classified  Muscle weakness (generalized)  Other symptoms and signs involving the nervous system     Problem List Patient Active Problem List   Diagnosis Date Noted  . Vitamin D deficiency 11/23/2019  . Weight loss 11/23/2019  . Headache 07/17/2019  . Class 3 severe obesity due to excess calories with serious comorbidity and body mass index (BMI) of 40.0 to 44.9 in adult (HCC) 11/01/2018  . Frequency of urination 11/01/2018  . Dysuria 11/01/2018  . Possible exposure to STD 11/01/2018  . Urinary tract infection without hematuria 11/01/2018  . Chronic insomnia 05/22/2017  . Iron deficiency anemia 05/22/2017  . Class 1 obesity due to excess calories with serious comorbidity and body mass index (BMI) of 33.0 to 33.9 in adult 05/22/2017  . Acid reflux 12/04/2016  . 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 Multiple sclerosis (HCC) 02/13/2013    Drake Leach, PT, DPT  08/17/2020, 1:54 PM  [facility] [address] [place], [place], [address] Phone: [phone]   Fax:  [phone]  Name: LANYA BUCKS MRN: [id number] Date of Birth: 1976/05/22

## 2020-08-20 ENCOUNTER — Ambulatory Visit: Payer: Medicare HMO | Admitting: Physical Therapy

## 2020-08-24 ENCOUNTER — Ambulatory Visit: Payer: Medicare HMO | Admitting: Physical Therapy

## 2020-08-27 ENCOUNTER — Ambulatory Visit: Payer: Medicare HMO | Admitting: Physical Therapy

## 2020-08-31 ENCOUNTER — Ambulatory Visit: Payer: Medicare HMO | Admitting: Physical Therapy

## 2020-09-01 ENCOUNTER — Ambulatory Visit: Payer: Medicare HMO | Admitting: Physical Therapy

## 2020-09-07 ENCOUNTER — Ambulatory Visit: Payer: Medicare HMO | Admitting: Physical Therapy

## 2020-09-09 ENCOUNTER — Ambulatory Visit: Payer: Medicare HMO | Admitting: Physical Therapy

## 2020-09-14 ENCOUNTER — Ambulatory Visit: Payer: Medicare HMO | Admitting: Physical Therapy

## 2020-09-16 ENCOUNTER — Telehealth: Payer: Self-pay | Admitting: *Deleted

## 2020-09-16 ENCOUNTER — Telehealth: Payer: Self-pay | Admitting: Neurology

## 2020-09-16 NOTE — Telephone Encounter (Signed)
LMVm for pt that per SS/NP not sure if MS exacerbation.  Could be related to stress/anxiety.  Recommended relax, rest and if things continue let us know next week.  Take nortriptyline as ordered.  She is to call back if questions.

## 2020-09-16 NOTE — Telephone Encounter (Signed)
I called pt.  She is asking if her sx would be MS exacerbation if started yesterday and are better today?  She had argument with someone yesterday then later yesterday noted photosensitivity, dizziness,-balance off,rooming spinning, no nausea, could not walk, taking tylenol for bad headache. She states that she has been off her nortriptyline for 2 wks, since her headaches stopped.  I told her that will send message to SS/NP. Instructed that  nortriptyline is migraine prevention medication and is to take even though headaches were gone.I relayed to restart this as prescribed.  She states her sx of dizziness, photosensiitivity, balance is 50 % better. Please advise.

## 2020-09-16 NOTE — Telephone Encounter (Signed)
Not clear this is exacerbation. Could have been anxiety to stressful situation. I would monitor for now, rest. Try to relax. Let us know if things aren't better next week.

## 2020-09-16 NOTE — Telephone Encounter (Signed)
Pt is asking for a call, she feels like since last night she is in an exacerbation.  Pt would like a call to discuss.

## 2020-09-16 NOTE — Telephone Encounter (Addendum)
Received call from [facility] support about getting PA for [facility] KEY [id number]. Initiated on [facility] for mayzent 2mg  tabsl. Tysabri,-2014, rebif 2014 to 2019, mayzent started 03/2018 due to progressive disease.  [employer] CM Part D (tecfidera and gilenya preferred). Determination pending.

## 2020-09-17 ENCOUNTER — Ambulatory Visit: Payer: Medicare HMO | Admitting: Physical Therapy

## 2020-09-20 NOTE — Telephone Encounter (Signed)
Received approval for MAYZENT from [facility] ID # [id number].  09-11-2020 thru 09-10-21. [id number].

## 2020-09-21 ENCOUNTER — Ambulatory Visit: Payer: Medicare HMO | Admitting: Physical Therapy

## 2020-09-24 ENCOUNTER — Ambulatory Visit: Payer: Medicare HMO | Admitting: Physical Therapy

## 2020-09-28 ENCOUNTER — Ambulatory Visit: Payer: Medicare HMO | Admitting: Physical Therapy

## 2020-10-01 ENCOUNTER — Ambulatory Visit: Payer: Medicare HMO | Admitting: Physical Therapy

## 2020-11-15 ENCOUNTER — Other Ambulatory Visit: Payer: Self-pay | Admitting: *Deleted

## 2020-11-15 MED ORDER — MAYZENT 2 MG PO TABS: 2.0000 mg | ORAL_TABLET | Freq: Every day | ORAL | 1 refills | Status: DC

## 2020-11-17 NOTE — Progress Notes (Signed)
PATIENT: Teresa Arnold DOB: October 10, 1975  REASON FOR VISIT: follow up HISTORY FROM: patient  HISTORY OF PRESENT ILLNESS: Today 11/18/20 Teresa Arnold is a 45 year old female with history of multiple sclerosis on Mayzent.  In September 2021, had left eye pain, blurry vision, treated with high-dose oral steroid for possible MS exacerbation, the right leg was heavy which is baseline but was worse.  Has been on Mayzent since 2019.  MRI of the brain in October 2021 was overall stable.  Has had headache on and off since starting Mayzent.  Could not tolerate Topamax.  She essentially recovered completely from the presumed exacerbation.  In September 2021, WBC was 4.0, absolute lymphocyte count was 0.2.   Did few weeks of PT, didn't notice a difference.   Headaches are gone away, taking nortriptyline 20 mg at bedtime. No side effects.   Feels MS is stable, no new symptoms. Getting around okay, no falls, chronic trouble with right leg, moves slow, feels heavy.  Never saw eye doctor, at times blurry vision comes and goes. May be from glasses. Goes to [employer] at the mall.   Is on prescription Vitamin D. In warmer months, walks about the school when waiting for kids.   Update 07/19/2020 SS:Teresa Arnold is a 45 year old female with a history of multiple sclerosis on Mayzent.  In early September 2021, reported left eye pain with side movement, blurry vision, treated with high-dose oral steroid for possible MS exacerbation, felt the right leg was heavy (is baseline but was worse).  Has been on Mayzent since 2019.  Since exacerbation, complained of daily headache.  Sent for MRI of the brain, in October 2021, overall stable was previous in June.  Placed on Topamax 75 mg at bedtime for headache, stopped it due to side effects, too much to keep up with.  Headaches have improved, continues to be daily, are located to the right frontal area, photophobia, phonophobia, will take 2 Tylenol, lay down for  about an hour, needs quiet, then goes away.  From MS standpoint, feels overall stable, back to normal.  Was never contacted to set up PT.  No falls, but is careful to prevent.  Here today for follow-up unaccompanied.  HISTORY 06/08/2020 SS: Teresa Arnold is a 45 year old female history of multiple sclerosis on Mayzent.  In June 2021, MRI of the brain and cervical spine was overall stable.  In early September 2021, reported left eye pain with side movement, blurry vision, was treated with high-dose oral steroid for possible MS exacerbation, felt the right leg was heavy (this is baseline, but was worse). On Mayzent since 2019.  Today, indicates vision has returned back to normal, still has the heaviness in the leg which is slightly more than baseline.  No falls.  Has pending appointment with ophthalmology.  Since exacerbation, has reported daily headache, bilateral temporal area, throbbing, photophobia.  Relieved with Tylenol, comes back when Tylenol wears off, taking 6-7 tablets daily.  Has history of migraines, initially had headache with initiation of Mayzent, then it went away.  Recently saw PCP, overall health is good.  No signs of infection.  Is interested in getting the Covid vaccine.  Presents today for evaluation unaccompanied.   REVIEW OF SYSTEMS: Out of a complete 14 system review of symptoms, the patient complains only of the following symptoms, and all other reviewed systems are negative.  Headache  ALLERGIES: Allergies  Allergen Reactions  . Other Shortness Of Breath and Itching    Tampons  .  Gadolinium Derivatives Nausea And Vomiting    Pt only received half dose of 79ml before getting sick.   . Sulfa Antibiotics Itching and Rash  . Tysabri [Natalizumab] Rash    HOME MEDICATIONS: Outpatient Medications Prior to Visit  Medication Sig Dispense Refill  . acetaminophen (TYLENOL) 325 MG tablet Take 650 mg by mouth every 6 (six) hours as needed for moderate pain.     . pantoprazole  (PROTONIX) 40 MG tablet TAKE 1 TABLET BY MOUTH EVERY DAY 30 tablet 10  . valACYclovir (VALTREX) 1000 MG tablet Take 1 tablet (1,000 mg total) by mouth daily. 90 tablet 2  . nortriptyline (PAMELOR) 10 MG capsule TAKE 1 AT BEDTIME X 1 WEEK, THEN TAKE 2 TABLETS AT BEDTIME 180 capsule 2  . Siponimod Fumarate (MAYZENT) 2 MG TABS Take 2 mg by mouth daily. 30 tablet 1  . Vitamin D, Ergocalciferol, (DRISDOL) 1.25 MG (50000 UNIT) CAPS capsule Take 1 capsule (50,000 Units total) by mouth every 7 (seven) days. 5 capsule 6  . Siponimod Fumarate (MAYZENT) 2 MG TABS Take 1 tablet by mouth daily. 90 tablet 2   No facility-administered medications prior to visit.    PAST MEDICAL HISTORY: Past Medical History:  Diagnosis Date  . Bronchitis   . Chronic insomnia 05/22/2017  . GERD (gastroesophageal reflux disease)   . Iron deficiency anemia 05/22/2017  . Morbid obesity (HCC) 05/22/2017  . MS (multiple sclerosis) (HCC)   . Vitamin B12 deficiency 11/2019  . Vitamin D deficiency 11/2019    PAST SURGICAL HISTORY: Past Surgical History:  Procedure Laterality Date  . CESAREAN SECTION     x4    FAMILY HISTORY: Family History  Problem Relation Age of Onset  . Hypertension Mother   . Hypertension Other     SOCIAL HISTORY: Social History   Socioeconomic History  . Marital status: Single    Spouse name: Not on file  . Number of children: 4  . Years of education: 13  . Highest education level: Not on file  Occupational History  . Not on file  Tobacco Use  . Smoking status: Never Smoker  . Smokeless tobacco: Never Used  Vaping Use  . Vaping Use: Never used  Substance and Sexual Activity  . Alcohol use: Not Currently    Alcohol/week: 0.0 standard drinks    Comment: occasionally  . Drug use: No  . Sexual activity: Yes    Birth control/protection: None  Other Topics Concern  . Not on file  Social History Narrative   Patient is single and lives with her family.   Patient has four children.    Patient has a college education.   Patient is right-handed.   Patient does not drink any caffeine.   Social Determinants of Health   Financial Resource Strain: Not on file  Food Insecurity: Not on file  Transportation Needs: Not on file  Physical Activity: Not on file  Stress: Not on file  Social Connections: Not on file  Intimate Partner Violence: Not on file      PHYSICAL EXAM  Vitals:   11/18/20 1048  BP: 114/75  Pulse: 72  Weight: 203 lb (92.1 kg)  Height: 5\' 3"  (1.6 m)   Body mass index is 35.96 kg/m.  Generalized: Well developed, in no acute distress  Neurological examination  Mentation: Alert oriented to time, place, history taking. Follows all commands speech and language fluent Cranial nerve II-XII: Pupils were equal round reactive to light. Extraocular movements were full, visual field  were full on confrontational test. Facial sensation and strength were normal. Head turning and shoulder shrug  were normal and symmetric. Motor: Good strength, exception 3/5 right hip flexion, 4/5 right knee extension/flexion Sensory: Sensory testing is intact to soft touch on all 4 extremities. No evidence of extinction is noted.  Coordination: Cerebellar testing reveals good finger-nose-finger and heel-to-shin bilaterally, but difficulty lifting the right leg Gait and station: Gait is wide-based, limp on the right, drag right foot Reflexes: Deep tendon reflexes are symmetric but somewhat increased throughout  DIAGNOSTIC DATA (LABS, IMAGING, TESTING) - I reviewed patient records, labs, notes, testing and imaging myself where available.  Lab Results  Component Value Date   WBC 4.0 06/08/2020   HGB 10.7 (L) 06/08/2020   HCT 33.4 (L) 06/08/2020   MCV 83 06/08/2020   PLT 357 06/08/2020      Component Value Date/Time   NA 142 06/08/2020 1018   K 4.0 06/08/2020 1018   CL 104 06/08/2020 1018   CO2 26 06/08/2020 1018   GLUCOSE 83 06/08/2020 1018   GLUCOSE 86 05/21/2017  1142   BUN 14 06/08/2020 1018   CREATININE 0.65 06/08/2020 1018   CREATININE 0.67 05/21/2017 1142   CALCIUM 9.0 06/08/2020 1018   PROT 6.4 06/08/2020 1018   ALBUMIN 3.8 06/08/2020 1018   AST 21 06/08/2020 1018   ALT 36 (H) 06/08/2020 1018   ALKPHOS 61 06/08/2020 1018   BILITOT 0.3 06/08/2020 1018   GFRNONAA 110 06/08/2020 1018   GFRNONAA 111 05/21/2017 1142   GFRAA 127 06/08/2020 1018   GFRAA 128 05/21/2017 1142   Lab Results  Component Value Date   CHOL 189 11/21/2019   HDL 61 11/21/2019   LDLCALC 119 (H) 11/21/2019   TRIG 47 11/21/2019   CHOLHDL 3.1 11/21/2019   Lab Results  Component Value Date   HGBA1C 5.2 05/28/2020   HGBA1C 5.2 05/28/2020   HGBA1C 5.2 (A) 05/28/2020   HGBA1C 5.2 05/28/2020   Lab Results  Component Value Date   VITAMINB12 208 (L) 11/21/2019   Lab Results  Component Value Date   TSH 0.975 11/21/2019    ASSESSMENT AND PLAN 45 y.o. year old female  has a past medical history of Bronchitis, Chronic insomnia (05/22/2017), GERD (gastroesophageal reflux disease), Iron deficiency anemia (05/22/2017), Morbid obesity (HCC) (05/22/2017), MS (multiple sclerosis) (HCC), Vitamin B12 deficiency (11/2019), and Vitamin D deficiency (11/2019). here with:  1.  Relapsing remitting multiple sclerosis, possible MS exacerbation early September 2021 evidenced by left eye pain, blurry vision, increased right leg heaviness  2. Headache   -Responded well to 3-day oral 500 mg prednisone treatment, back to baseline in September 2021, chronic sensation of right leg heaviness  -MRI of the brain without contrast was overall stable in October 2021, cannot have contrast  -Headaches have resolved, will continue nortriptyline 20 mg at bedtime, helps with sleep  -Referral to ophthalmology x 2, on Mayzent, also MRI mentions empty sella, could be incidental finding or due to elevated intracranial pressure, has been unchanged since 2018, make sure no papilledema, intermittent blurry  vision  -Check routine labs today, previous absolute lymphocyte count was 0.2  -Will follow-up in 6 months or sooner if needed with Dr. Anne Hahn   I spent 30 minutes of face-to-face and non-face-to-face time with patient.  This included previsit chart review, lab review, study review, order entry, electronic health record documentation, patient education.  Margie Ege, AGNP-C, DNP 11/18/2020, 11:12 AM [facility] [address] [place],  [place] [address] [phone]

## 2020-11-18 ENCOUNTER — Encounter: Payer: Self-pay | Admitting: Neurology

## 2020-11-18 ENCOUNTER — Other Ambulatory Visit: Payer: Self-pay

## 2020-11-18 ENCOUNTER — Ambulatory Visit (INDEPENDENT_AMBULATORY_CARE_PROVIDER_SITE_OTHER): Payer: Medicare HMO | Admitting: Neurology

## 2020-11-18 VITALS — BP 114/75 | HR 72 | Ht 63.0 in | Wt 203.0 lb

## 2020-11-18 DIAGNOSIS — G35 Multiple sclerosis: Secondary | ICD-10-CM

## 2020-11-18 DIAGNOSIS — R519 Headache, unspecified: Secondary | ICD-10-CM | POA: Diagnosis not present

## 2020-11-18 MED ORDER — NORTRIPTYLINE HCL 10 MG PO CAPS: ORAL_CAPSULE | ORAL | 2 refills | Status: DC

## 2020-11-18 MED ORDER — MAYZENT 2 MG PO TABS: 2.0000 mg | ORAL_TABLET | Freq: Every day | ORAL | 3 refills | Status: DC

## 2020-11-18 NOTE — Patient Instructions (Signed)
For now continue current medications Referral to eye doctor  Check labs See you back in 6 months

## 2020-11-19 LAB — CBC WITH DIFFERENTIAL/PLATELET
Basophils Absolute: 0 10*3/uL (ref 0.0–0.2)
Basos: 1 %
EOS (ABSOLUTE): 0 10*3/uL (ref 0.0–0.4)
Eos: 0 %
Hematocrit: 37.4 % (ref 34.0–46.6)
Hemoglobin: 11.9 g/dL (ref 11.1–15.9)
Immature Grans (Abs): 0 10*3/uL (ref 0.0–0.1)
Immature Granulocytes: 1 %
Lymphocytes Absolute: 0.2 10*3/uL — ABNORMAL LOW (ref 0.7–3.1)
Lymphs: 5 %
MCH: 26.2 pg — ABNORMAL LOW (ref 26.6–33.0)
MCHC: 31.8 g/dL (ref 31.5–35.7)
MCV: 82 fL (ref 79–97)
Monocytes Absolute: 0.5 10*3/uL (ref 0.1–0.9)
Monocytes: 18 %
Neutrophils Absolute: 2.3 10*3/uL (ref 1.4–7.0)
Neutrophils: 75 %
Platelets: 324 10*3/uL (ref 150–450)
RBC: 4.54 x10E6/uL (ref 3.77–5.28)
RDW: 12.7 % (ref 11.7–15.4)
WBC: 3 10*3/uL — ABNORMAL LOW (ref 3.4–10.8)

## 2020-11-19 LAB — COMPREHENSIVE METABOLIC PANEL
ALT: 12 IU/L (ref 0–32)
AST: 16 IU/L (ref 0–40)
Albumin/Globulin Ratio: 1.6 (ref 1.2–2.2)
Albumin: 4.5 g/dL (ref 3.8–4.8)
Alkaline Phosphatase: 68 IU/L (ref 44–121)
BUN/Creatinine Ratio: 14 (ref 9–23)
BUN: 12 mg/dL (ref 6–24)
Bilirubin Total: 0.3 mg/dL (ref 0.0–1.2)
CO2: 22 mmol/L (ref 20–29)
Calcium: 9.2 mg/dL (ref 8.7–10.2)
Chloride: 102 mmol/L (ref 96–106)
Creatinine, Ser: 0.84 mg/dL (ref 0.57–1.00)
Globulin, Total: 2.8 g/dL (ref 1.5–4.5)
Glucose: 78 mg/dL (ref 65–99)
Potassium: 4.6 mmol/L (ref 3.5–5.2)
Sodium: 139 mmol/L (ref 134–144)
Total Protein: 7.3 g/dL (ref 6.0–8.5)
eGFR: 89 mL/min/{1.73_m2} (ref >59)

## 2020-11-21 NOTE — Progress Notes (Signed)
I have read the note, and I agree with the clinical assessment and plan.  Avaleigh Decuir K Cotey Rakes   

## 2020-11-23 ENCOUNTER — Other Ambulatory Visit: Payer: Self-pay | Admitting: Neurology

## 2020-11-24 ENCOUNTER — Telehealth: Payer: Self-pay

## 2020-11-24 NOTE — Telephone Encounter (Signed)
-----   Message from Glean Salvo, NP sent at 11/22/2020  4:22 PM EDT ----- Please call the patient.  Labs show stable low absolute lymphocyte count 0.2 while on Mayzent, this is okay for now, would not want any lower.  CMP is normal.

## 2020-11-24 NOTE — Telephone Encounter (Signed)
Pt verified by name and DOB,  normal results given per provider, pt voiced understanding all question answered. °

## 2020-11-26 ENCOUNTER — Other Ambulatory Visit: Payer: Self-pay

## 2020-11-26 ENCOUNTER — Encounter: Payer: Self-pay | Admitting: Family Medicine

## 2020-11-26 ENCOUNTER — Other Ambulatory Visit: Payer: Medicare HMO | Admitting: Family Medicine

## 2020-11-26 VITALS — BP 112/68 | HR 67 | Ht 63.0 in | Wt 207.0 lb

## 2020-11-26 DIAGNOSIS — Z09 Encounter for follow-up examination after completed treatment for conditions other than malignant neoplasm: Secondary | ICD-10-CM

## 2020-11-26 DIAGNOSIS — R634 Abnormal weight loss: Secondary | ICD-10-CM

## 2020-11-26 DIAGNOSIS — I10 Essential (primary) hypertension: Secondary | ICD-10-CM

## 2020-11-26 DIAGNOSIS — G35 Multiple sclerosis: Secondary | ICD-10-CM

## 2020-11-26 DIAGNOSIS — G35D Multiple sclerosis, unspecified: Secondary | ICD-10-CM

## 2020-11-26 DIAGNOSIS — R29898 Other symptoms and signs involving the musculoskeletal system: Secondary | ICD-10-CM

## 2020-11-26 DIAGNOSIS — Z0000 Encounter for general adult medical examination without abnormal findings: Secondary | ICD-10-CM

## 2020-11-26 NOTE — Progress Notes (Signed)
[facility]  Established Patient Office Visit  Subjective:  Patient ID: Teresa Arnold, female    DOB: 01-07-1976  Age: 45 y.o. MRN: [id number]  CC:  Chief Complaint  Patient presents with  . Annual Exam    HPI Teresa Arnold is a 45 year old female who presents for Follow Up today.   Patient Active Problem List   Diagnosis Date Noted  . Vitamin D deficiency 11/23/2019  . Weight loss 11/23/2019  . Headache 07/17/2019  . Class 3 severe obesity due to excess calories with serious comorbidity and body mass index (BMI) of 40.0 to 44.9 in adult (HCC) 11/01/2018  . Frequency of urination 11/01/2018  . Dysuria 11/01/2018  . Possible exposure to STD 11/01/2018  . Urinary tract infection without hematuria 11/01/2018  . Chronic insomnia 05/22/2017  . Iron deficiency anemia 05/22/2017  . Class 1 obesity due to excess calories with serious comorbidity and body mass index (BMI) of 33.0 to 33.9 in adult 05/22/2017  . Acid reflux 12/04/2016  . 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 Multiple sclerosis (HCC) 02/13/2013   Current Status: Since her last office visit, she is doing well with no complaints. She denies fevers, chills, fatigue, recent infections, weight loss, and night sweats. She has not had any headaches, visual changes, dizziness, and falls. No chest pain, heart palpitations, cough and shortness of breath reported. Denies GI problems such as nausea, vomiting, diarrhea, and constipation. She has no reports of blood in stools, dysuria and hematuria. No depression or anxiety, and denies suicidal ideations, homicidal ideations, or auditory hallucinations. She is taking all medications as prescribed. She denies pain today.   Past Medical History:  Diagnosis Date  . Bronchitis   . Chronic insomnia 05/22/2017  . GERD (gastroesophageal reflux disease)   . Iron deficiency anemia 05/22/2017  . Morbid obesity (HCC) 05/22/2017  . MS (multiple  sclerosis) (HCC)   . Vitamin B12 deficiency 11/2019  . Vitamin D deficiency 11/2019    Past Surgical History:  Procedure Laterality Date  . CESAREAN SECTION     x4    Family History  Problem Relation Age of Onset  . Hypertension Mother   . Hypertension Other     Social History   Socioeconomic History  . Marital status: Single    Spouse name: Not on file  . Number of children: 4  . Years of education: 37  . Highest education level: Not on file  Occupational History  . Not on file  Tobacco Use  . Smoking status: Never Smoker  . Smokeless tobacco: Never Used  Vaping Use  . Vaping Use: Never used  Substance and Sexual Activity  . Alcohol use: Not Currently    Alcohol/week: 0.0 standard drinks    Comment: occasionally  . Drug use: No  . Sexual activity: Yes    Birth control/protection: None  Other Topics Concern  . Not on file  Social History Narrative   Patient is single and lives with her family.   Patient has four children.   Patient has a college education.   Patient is right-handed.   Patient does not drink any caffeine.   Social Determinants of Health   Financial Resource Strain: Not on file  Food Insecurity: Not on file  Transportation Needs: Not on file  Physical Activity: Not on file  Stress: Not on file  Social Connections: Not on file  Intimate Partner Violence: Not on file  Outpatient Medications Prior to Visit  Medication Sig Dispense Refill  . acetaminophen (TYLENOL) 325 MG tablet Take 650 mg by mouth every 6 (six) hours as needed for moderate pain.     Marland Kitchen MAYZENT 2 MG TABS TAKE 1 TABLET (2MG ) BY  MOUTH ONCE DAILY (DISCARD 3 MONTHS AFTER OPENING  BOTTLE) 90 tablet 3  . nortriptyline (PAMELOR) 10 MG capsule Take 2 at bedtime 180 capsule 2  . pantoprazole (PROTONIX) 40 MG tablet TAKE 1 TABLET BY MOUTH EVERY DAY 30 tablet 10  . valACYclovir (VALTREX) 1000 MG tablet Take 1 tablet (1,000 mg total) by mouth daily. 90 tablet 2  . Vitamin D,  Ergocalciferol, (DRISDOL) 1.25 MG (50000 UNIT) CAPS capsule Take 1 capsule (50,000 Units total) by mouth every 7 (seven) days. 5 capsule 6   No facility-administered medications prior to visit.    Allergies  Allergen Reactions  . Other Shortness Of Breath and Itching    Tampons  . Gadolinium Derivatives Nausea And Vomiting    Pt only received half dose of 65ml before getting sick.   . Sulfa Antibiotics Itching and Rash  . Tysabri [Natalizumab] Rash    ROS Review of Systems  Constitutional: Negative.   HENT: Negative.   Eyes: Negative.   Respiratory: Negative.   Cardiovascular: Negative.   Gastrointestinal: Negative.   Endocrine: Negative.   Genitourinary: Negative.   Musculoskeletal: Negative.   Skin: Negative.   Allergic/Immunologic: Negative.   Neurological: Positive for dizziness (occasional ), weakness (right extremity weakness) and headaches (occasional).  Hematological: Negative.   Psychiatric/Behavioral: Negative.       Objective:    Physical Exam Vitals and nursing note reviewed.  Constitutional:      Appearance: Normal appearance. She is obese.  HENT:     Head: Normocephalic and atraumatic.     Nose: Nose normal.     Mouth/Throat:     Mouth: Mucous membranes are moist.     Pharynx: Oropharynx is clear.  Cardiovascular:     Rate and Rhythm: Normal rate and regular rhythm.     Pulses: Normal pulses.     Heart sounds: Normal heart sounds.  Pulmonary:     Effort: Pulmonary effort is normal.     Breath sounds: Normal breath sounds.  Abdominal:     General: Bowel sounds are normal. There is distension.     Palpations: Abdomen is soft.  Musculoskeletal:        General: Normal range of motion.     Cervical back: Normal range of motion and neck supple.  Skin:    General: Skin is warm and dry.  Neurological:     Mental Status: She is alert and oriented to person, place, and time.     Motor: Weakness (right extremity weakness) present.     Gait: Gait  abnormal (r/t MS).  Psychiatric:        Mood and Affect: Mood normal.        Behavior: Behavior normal.        Thought Content: Thought content normal.        Judgment: Judgment normal.    BP 112/68   Pulse 67   Ht 5\' 3"  (1.6 m)   Wt 207 lb (93.9 kg)   LMP 11/22/2020   SpO2 100%   BMI 36.67 kg/m  Wt Readings from Last 3 Encounters:  11/26/20 207 lb (93.9 kg)  11/18/20 203 lb (92.1 kg)  07/19/20 199 lb 9.6 oz (90.5 kg)     Health  Maintenance Due  Topic Date Due  . PAP SMEAR-Modifier  11/19/2020    There are no preventive care reminders to display for this patient.  Lab Results  Component Value Date   TSH 0.975 11/21/2019   Lab Results  Component Value Date   WBC 3.0 (L) 11/18/2020   HGB 11.9 11/18/2020   HCT 37.4 11/18/2020   MCV 82 11/18/2020   PLT 324 11/18/2020   Lab Results  Component Value Date   NA 139 11/18/2020   K 4.6 11/18/2020   CO2 22 11/18/2020   GLUCOSE 78 11/18/2020   BUN 12 11/18/2020   CREATININE 0.84 11/18/2020   BILITOT 0.3 11/18/2020   ALKPHOS 68 11/18/2020   AST 16 11/18/2020   ALT 12 11/18/2020   PROT 7.3 11/18/2020   ALBUMIN 4.5 11/18/2020   CALCIUM 9.2 11/18/2020   ANIONGAP 8 11/13/2016   Lab Results  Component Value Date   CHOL 189 11/21/2019   Lab Results  Component Value Date   HDL 61 11/21/2019   Lab Results  Component Value Date   LDLCALC 119 (H) 11/21/2019   Lab Results  Component Value Date   TRIG 47 11/21/2019   Lab Results  Component Value Date   CHOLHDL 3.1 11/21/2019   Lab Results  Component Value Date   HGBA1C 5.2 05/28/2020   HGBA1C 5.2 05/28/2020   HGBA1C 5.2 (A) 05/28/2020   HGBA1C 5.2 05/28/2020    Assessment & Plan:   1. Multiple sclerosis (HCC) Stable. She will continue to follow up with Neurology as needed.   2. Hypertension, unspecified type The current medical regimen is effective; blood pressure is stable at 112/68 today; continue present plan and medications as prescribed. She  will continue to take medications as prescribed, to decrease high sodium intake, excessive alcohol intake, increase potassium intake, smoking cessation, and increase physical activity of at least 30 minutes of cardio activity daily. She will continue to follow Heart Healthy or DASH diet.  3. Weight loss  4. Weakness of both lower extremities  5. Healthcare maintenance - TSH - Vitamin B12 - Vitamin D, 25-hydroxy - Lipid Panel  6. Follow up She will follow up in 6 months.   No orders of the defined types were placed in this encounter.   Orders Placed This Encounter  Procedures  . TSH  . Vitamin B12  . Vitamin D, 25-hydroxy  . Lipid Panel    Referral Orders  No referral(s) requested today    Raliegh Ip, MSN, ANE, FNP-BC [facility] [address] [place], [place] [address] [phone]   Problem List Items Addressed This Visit      Nervous and Auditory   Multiple sclerosis (HCC) - Primary     Other   Weight loss    Other Visit Diagnoses    Hypertension, unspecified type       Weakness of both lower extremities       Healthcare maintenance       Relevant Orders   TSH   Vitamin B12   Vitamin D, 25-hydroxy   Lipid Panel   Follow up          No orders of the defined types were placed in this encounter.   Follow-up: No follow-ups on file.    Kallie Locks, FNP

## 2020-11-27 ENCOUNTER — Other Ambulatory Visit: Payer: Self-pay | Admitting: Family Medicine

## 2020-11-27 ENCOUNTER — Encounter: Payer: Self-pay | Admitting: Family Medicine

## 2020-11-27 DIAGNOSIS — E559 Vitamin D deficiency, unspecified: Secondary | ICD-10-CM

## 2020-11-27 LAB — VITAMIN D 25 HYDROXY (VIT D DEFICIENCY, FRACTURES): Vit D, 25-Hydroxy: 19.2 ng/mL — ABNORMAL LOW (ref 30.0–100.0)

## 2020-11-27 LAB — TSH: TSH: 0.965 u[IU]/mL (ref 0.450–4.500)

## 2020-11-27 LAB — LIPID PANEL
Chol/HDL Ratio: 2.4 ratio (ref 0.0–4.4)
Cholesterol, Total: 159 mg/dL (ref 100–199)
HDL: 65 mg/dL (ref >39)
LDL Chol Calc (NIH): 84 mg/dL (ref 0–99)
Triglycerides: 44 mg/dL (ref 0–149)
VLDL Cholesterol Cal: 10 mg/dL (ref 5–40)

## 2020-11-27 LAB — VITAMIN B12: Vitamin B-12: 387 pg/mL (ref 232–1245)

## 2020-11-27 MED ORDER — VITAMIN D (ERGOCALCIFEROL) 1.25 MG (50000 UNIT) PO CAPS: 50000.0000 [IU] | ORAL_CAPSULE | ORAL | 3 refills | Status: DC

## 2020-12-13 ENCOUNTER — Encounter: Payer: Self-pay | Admitting: Neurology

## 2021-01-12 ENCOUNTER — Other Ambulatory Visit: Payer: Self-pay | Admitting: Dermatology

## 2021-01-12 ENCOUNTER — Other Ambulatory Visit: Payer: Self-pay | Admitting: Nurse Practitioner

## 2021-01-12 ENCOUNTER — Other Ambulatory Visit: Payer: Self-pay

## 2021-02-10 DIAGNOSIS — H468 Other optic neuritis: Secondary | ICD-10-CM | POA: Diagnosis not present

## 2021-02-10 DIAGNOSIS — H4720 Unspecified optic atrophy: Secondary | ICD-10-CM | POA: Diagnosis not present

## 2021-02-10 DIAGNOSIS — H2513 Age-related nuclear cataract, bilateral: Secondary | ICD-10-CM | POA: Diagnosis not present

## 2021-02-14 DIAGNOSIS — Z0100 Encounter for examination of eyes and vision without abnormal findings: Secondary | ICD-10-CM | POA: Diagnosis not present

## 2021-02-14 DIAGNOSIS — H524 Presbyopia: Secondary | ICD-10-CM | POA: Diagnosis not present

## 2021-03-03 ENCOUNTER — Encounter: Payer: Medicare HMO | Admitting: Nurse Practitioner

## 2021-03-19 DIAGNOSIS — U071 COVID-19: Secondary | ICD-10-CM | POA: Diagnosis not present

## 2021-03-19 DIAGNOSIS — N926 Irregular menstruation, unspecified: Secondary | ICD-10-CM | POA: Diagnosis not present

## 2021-03-19 DIAGNOSIS — Z20822 Contact with and (suspected) exposure to covid-19: Secondary | ICD-10-CM | POA: Diagnosis not present

## 2021-04-01 DIAGNOSIS — Z79899 Other long term (current) drug therapy: Secondary | ICD-10-CM | POA: Diagnosis not present

## 2021-04-01 DIAGNOSIS — G35 Multiple sclerosis: Secondary | ICD-10-CM | POA: Diagnosis not present

## 2021-04-01 DIAGNOSIS — Z823 Family history of stroke: Secondary | ICD-10-CM | POA: Diagnosis not present

## 2021-04-01 DIAGNOSIS — E669 Obesity, unspecified: Secondary | ICD-10-CM | POA: Diagnosis not present

## 2021-04-01 DIAGNOSIS — G43909 Migraine, unspecified, not intractable, without status migrainosus: Secondary | ICD-10-CM | POA: Diagnosis not present

## 2021-04-01 DIAGNOSIS — Z20822 Contact with and (suspected) exposure to covid-19: Secondary | ICD-10-CM | POA: Diagnosis not present

## 2021-04-01 DIAGNOSIS — Z8249 Family history of ischemic heart disease and other diseases of the circulatory system: Secondary | ICD-10-CM | POA: Diagnosis not present

## 2021-04-01 DIAGNOSIS — K219 Gastro-esophageal reflux disease without esophagitis: Secondary | ICD-10-CM | POA: Diagnosis not present

## 2021-04-01 DIAGNOSIS — B029 Zoster without complications: Secondary | ICD-10-CM | POA: Diagnosis not present

## 2021-04-01 DIAGNOSIS — D8481 Immunodeficiency due to conditions classified elsewhere: Secondary | ICD-10-CM | POA: Diagnosis not present

## 2021-05-02 ENCOUNTER — Other Ambulatory Visit: Payer: Self-pay | Admitting: Nurse Practitioner

## 2021-05-02 ENCOUNTER — Telehealth: Payer: Self-pay

## 2021-05-02 MED ORDER — VALACYCLOVIR HCL 1 G PO TABS: 1000.0000 mg | ORAL_TABLET | Freq: Every day | ORAL | 2 refills | Status: DC

## 2021-05-02 NOTE — Telephone Encounter (Signed)
Valacyclovir med refill

## 2021-05-30 ENCOUNTER — Encounter: Payer: Self-pay | Admitting: Nurse Practitioner

## 2021-05-30 ENCOUNTER — Ambulatory Visit (INDEPENDENT_AMBULATORY_CARE_PROVIDER_SITE_OTHER): Payer: Medicare HMO | Admitting: Nurse Practitioner

## 2021-05-30 ENCOUNTER — Ambulatory Visit: Payer: Medicare HMO | Admitting: Family Medicine

## 2021-05-30 ENCOUNTER — Other Ambulatory Visit: Payer: Self-pay

## 2021-05-30 VITALS — BP 122/66 | HR 61 | Temp 97.3°F | Ht 65.0 in | Wt 210.8 lb

## 2021-05-30 DIAGNOSIS — Z0000 Encounter for general adult medical examination without abnormal findings: Secondary | ICD-10-CM

## 2021-05-30 DIAGNOSIS — Z1231 Encounter for screening mammogram for malignant neoplasm of breast: Secondary | ICD-10-CM | POA: Diagnosis not present

## 2021-05-30 DIAGNOSIS — E669 Obesity, unspecified: Secondary | ICD-10-CM | POA: Diagnosis not present

## 2021-05-30 DIAGNOSIS — G35 Multiple sclerosis: Secondary | ICD-10-CM | POA: Diagnosis not present

## 2021-05-30 LAB — POCT URINALYSIS DIP (CLINITEK)
Bilirubin, UA: NEGATIVE
Blood, UA: NEGATIVE
Glucose, UA: NEGATIVE mg/dL
Ketones, POC UA: NEGATIVE mg/dL
Leukocytes, UA: NEGATIVE
Nitrite, UA: NEGATIVE
POC PROTEIN,UA: NEGATIVE
Spec Grav, UA: 1.015 (ref 1.010–1.025)
Urobilinogen, UA: 0.2 E.U./dL
pH, UA: 5.5 (ref 5.0–8.0)

## 2021-05-30 NOTE — Progress Notes (Signed)
[place], [place]  [address] Phone:  [phone]   Fax:  [phone]   Established Patient Office Visit  Subjective:  Patient ID: Teresa Arnold, female    DOB: 1976-04-14  Age: 45 y.o. MRN: [id number]  CC:  Chief Complaint  Patient presents with   Follow-up    Pt is here today for her follow up visit. Pt has no concerns or issues to discuss today.    HPI Teresa Arnold presents for follow up. A former patient of NP Stroud. She  has a past medical history of Bronchitis, Chronic insomnia (05/22/2017), GERD (gastroesophageal reflux disease), Iron deficiency anemia (05/22/2017), Morbid obesity ([place]) (05/22/2017), MS (multiple sclerosis) ([place]), Vitamin B12 deficiency (11/2019), and Vitamin D deficiency (11/2019).   She presents for evaluation of obesity. She has noted a weight gain of approximately 7 pounds over the last 6 months. She reports that she lost > 60 pounds a few years ago. She feels ideal weight is <200 pounds. History of eating disorders: none. Previous treatments for obesity include: nutritionist consultation, self-directed dieting, very low calorie diet, and increased fasting up to 97 hours. Associated medical conditions: none. Associated medications: none. Cardiovascular risk factors besides obesity: obesity (BMI >= 30 kg/m2) and sedentary lifestyle. She is unable to exercise due to her MS and weakness on her right side.   Past Medical History:  Diagnosis Date   Bronchitis    Chronic insomnia 05/22/2017   GERD (gastroesophageal reflux disease)    Iron deficiency anemia 05/22/2017   Morbid obesity ([place]) 05/22/2017   MS (multiple sclerosis) ([place])    Vitamin B12 deficiency 11/2019   Vitamin D deficiency 11/2019    Past Surgical History:  Procedure Laterality Date   CESAREAN SECTION     x4    Family History  Problem Relation Age of Onset   Hypertension Mother    Hypertension Other     Social History    Socioeconomic History   Marital status: Single    Spouse name: Not on file   Number of children: 4   Years of education: 14   Highest education level: Not on file  Occupational History   Not on file  Tobacco Use   Smoking status: Never   Smokeless tobacco: Never  Vaping Use   Vaping Use: Never used  Substance and Sexual Activity   Alcohol use: Not Currently    Alcohol/week: 0.0 standard drinks    Comment: occasionally   Drug use: No   Sexual activity: Yes    Birth control/protection: None  Other Topics Concern   Not on file  Social History Narrative   Patient is single and lives with her family.   Patient has four children.   Patient has a college education.   Patient is right-handed.   Patient does not drink any caffeine.   Social Determinants of Health   Financial Resource Strain: Not on file  Food Insecurity: Not on file  Transportation Needs: Not on file  Physical Activity: Not on file  Stress: Not on file  Social Connections: Not on file  Intimate Partner Violence: Not on file    Outpatient Medications Prior to Visit  Medication Sig Dispense Refill   acetaminophen (TYLENOL) 325 MG tablet Take 650 mg by mouth every 6 (six) hours as needed for moderate pain.      MAYZENT 2 MG TABS TAKE 1 TABLET ($RemoveB'2MG'TipHvFrt$ ) BY  MOUTH ONCE DAILY (DISCARD 3 MONTHS AFTER  OPENING  BOTTLE) 90 tablet 3   pantoprazole (PROTONIX) 40 MG tablet TAKE 1 TABLET BY MOUTH EVERY DAY 30 tablet 10   valACYclovir (VALTREX) 1000 MG tablet Take 1 tablet (1,000 mg total) by mouth daily. 90 tablet 2   nortriptyline (PAMELOR) 10 MG capsule Take 2 at bedtime 180 capsule 2   Vitamin D, Ergocalciferol, (DRISDOL) 1.25 MG (50000 UNIT) CAPS capsule Take 1 capsule (50,000 Units total) by mouth every 7 (seven) days. 5 capsule 3   No facility-administered medications prior to visit.    Allergies  Allergen Reactions   Other Shortness Of Breath and Itching    Tampons   Gadolinium Derivatives Nausea And Vomiting     Pt only received half dose of 34ml before getting sick.    Sulfa Antibiotics Itching and Rash   Tysabri [Natalizumab] Rash    ROS Review of Systems    Objective:    Physical Exam Constitutional:      Appearance: She is obese.  HENT:     Head: Normocephalic and atraumatic.  Cardiovascular:     Rate and Rhythm: Normal rate and regular rhythm.     Pulses: Normal pulses.     Heart sounds: Normal heart sounds.  Pulmonary:     Effort: Pulmonary effort is normal.     Breath sounds: Normal breath sounds.  Abdominal:     General: Bowel sounds are normal.     Palpations: Abdomen is soft.  Musculoskeletal:        General: Normal range of motion.     Cervical back: Normal range of motion.     Comments: Right sided weakness  Skin:    General: Skin is warm and dry.     Capillary Refill: Capillary refill takes less than 2 seconds.  Neurological:     General: No focal deficit present.     Mental Status: She is alert and oriented to person, place, and time.  Psychiatric:        Mood and Affect: Mood normal.        Behavior: Behavior normal.        Thought Content: Thought content normal.        Judgment: Judgment normal.    BP 122/66   Pulse 61   Temp (!) 97.3 F (36.3 C)   Ht $R'5\' 5"'JM$  (1.651 m)   Wt 210 lb 12.8 oz (95.6 kg)   SpO2 96%   BMI 35.08 kg/m  Wt Readings from Last 3 Encounters:  05/30/21 210 lb 12.8 oz (95.6 kg)  11/26/20 207 lb (93.9 kg)  11/18/20 203 lb (92.1 kg)     Health Maintenance Due  Topic Date Due   COVID-19 Vaccine (3 - Pfizer risk series) 07/30/2020   PAP SMEAR-Modifier  11/19/2020   INFLUENZA VACCINE  04/11/2021    There are no preventive care reminders to display for this patient.  Lab Results  Component Value Date   TSH 0.965 11/26/2020   Lab Results  Component Value Date   WBC 3.0 (L) 11/18/2020   HGB 11.9 11/18/2020   HCT 37.4 11/18/2020   MCV 82 11/18/2020   PLT 324 11/18/2020   Lab Results  Component Value Date   NA 139  11/18/2020   K 4.6 11/18/2020   CO2 22 11/18/2020   GLUCOSE 78 11/18/2020   BUN 12 11/18/2020   CREATININE 0.84 11/18/2020   BILITOT 0.3 11/18/2020   ALKPHOS 68 11/18/2020   AST 16 11/18/2020   ALT 12 11/18/2020  PROT 7.3 11/18/2020   ALBUMIN 4.5 11/18/2020   CALCIUM 9.2 11/18/2020   ANIONGAP 8 11/13/2016   EGFR 89 11/18/2020   Lab Results  Component Value Date   CHOL 159 11/26/2020   Lab Results  Component Value Date   HDL 65 11/26/2020   Lab Results  Component Value Date   LDLCALC 84 11/26/2020   Lab Results  Component Value Date   TRIG 44 11/26/2020   Lab Results  Component Value Date   CHOLHDL 2.4 11/26/2020   Lab Results  Component Value Date   HGBA1C 5.2 05/28/2020   HGBA1C 5.2 05/28/2020   HGBA1C 5.2 (A) 05/28/2020   HGBA1C 5.2 05/28/2020      Assessment & Plan:   Problem List Items Addressed This Visit   None Visit Diagnoses     Healthcare maintenance    -  Primary   Relevant Orders   POCT URINALYSIS DIP (CLINITEK) (Completed)   MS (multiple sclerosis) ([place])    Persistent but stable on [place] Continue with current regimen and follow up with neurology     Encounter for screening mammogram for malignant neoplasm of breast       Relevant Orders   MM 3D SCREEN BREAST BILATERAL   Obesity (BMI 35.0-39.9 without comorbidity)     Persistent Undesired weight gain back over 200 Longterm fasting ineffective Nutrition consult for evaluation   Relevant Orders   Amb ref to Medical Nutrition Therapy-MNT       No orders of the defined types were placed in this encounter.   Follow-up: Return in about 6 months (around 11/27/2021) for [place] [[id number]].    Vevelyn Francois, NP

## 2021-05-30 NOTE — Patient Instructions (Addendum)
Health Maintenance, Female Adopting a healthy lifestyle and getting preventive care are important in promoting health and wellness. Ask your health care provider about: The right schedule for you to have regular tests and exams. Things you can do on your own to prevent diseases and keep yourself healthy. What should I know about diet, weight, and exercise? Eat a healthy diet  Eat a diet that includes plenty of vegetables, fruits, low-fat dairy products, and lean protein. Do not eat a lot of foods that are high in solid fats, added sugars, or sodium. Maintain a healthy weight Body mass index (BMI) is used to identify weight problems. It estimates body fat based on height and weight. Your health care provider can help determine your BMI and help you achieve or maintain a healthy weight. Get regular exercise Get regular exercise. This is one of the most important things you can do for your health. Most adults should: Exercise for at least 150 minutes each week. The exercise should increase your heart rate and make you sweat (moderate-intensity exercise). Do strengthening exercises at least twice a week. This is in addition to the moderate-intensity exercise. Spend less time sitting. Even light physical activity can be beneficial. Watch cholesterol and blood lipids Have your blood tested for lipids and cholesterol at 43 years of age, then have this test every 5 years. Have your cholesterol levels checked more often if: Your lipid or cholesterol levels are high. You are older than 43 years of age. You are at high risk for heart disease. What should I know about cancer screening? Depending on your health history and family history, you may need to have cancer screening at various ages. This may include screening for: Breast cancer. Cervical cancer. Colorectal cancer. Skin cancer. Lung cancer. What should I know about heart disease, diabetes, and high blood pressure? Blood pressure and heart  disease High blood pressure causes heart disease and increases the risk of stroke. This is more likely to develop in people who have high blood pressure readings, are of African descent, or are overweight. Have your blood pressure checked: Every 3-5 years if you are 18-39 years of age. Every year if you are 40 years old or older. Diabetes Have regular diabetes screenings. This checks your fasting blood sugar level. Have the screening done: Once every three years after age 40 if you are at a normal weight and have a low risk for diabetes. More often and at a younger age if you are overweight or have a high risk for diabetes. What should I know about preventing infection? Hepatitis B If you have a higher risk for hepatitis B, you should be screened for this virus. Talk with your health care provider to find out if you are at risk for hepatitis B infection. Hepatitis C Testing is recommended for: Everyone born from 1945 through 1965. Anyone with known risk factors for hepatitis C. Sexually transmitted infections (STIs) Get screened for STIs, including gonorrhea and chlamydia, if: You are sexually active and are younger than 43 years of age. You are older than 43 years of age and your health care provider tells you that you are at risk for this type of infection. Your sexual activity has changed since you were last screened, and you are at increased risk for chlamydia or gonorrhea. Ask your health care provider if you are at risk. Ask your health care provider about whether you are at high risk for HIV. Your health care provider may recommend a prescription medicine   to help prevent HIV infection. If you choose to take medicine to prevent HIV, you should first get tested for HIV. You should then be tested every 3 months for as long as you are taking the medicine. Pregnancy If you are about to stop having your period (premenopausal) and you may become pregnant, seek counseling before you get  pregnant. Take 400 to 800 micrograms (mcg) of folic acid every day if you become pregnant. Ask for birth control (contraception) if you want to prevent pregnancy. Osteoporosis and menopause Osteoporosis is a disease in which the bones lose minerals and strength with aging. This can result in bone fractures. If you are 39 years old or older, or if you are at risk for osteoporosis and fractures, ask your health care provider if you should: Be screened for bone loss. Take a calcium or vitamin D supplement to lower your risk of fractures. Be given hormone replacement therapy (HRT) to treat symptoms of menopause. Follow these instructions at home: Lifestyle Do not use any products that contain nicotine or tobacco, such as cigarettes, e-cigarettes, and chewing tobacco. If you need help quitting, ask your health care provider. Do not use street drugs. Do not share needles. Ask your health care provider for help if you need support or information about quitting drugs. Alcohol use Do not drink alcohol if: Your health care provider tells you not to drink. You are pregnant, may be pregnant, or are planning to become pregnant. If you drink alcohol: Limit how much you use to 0-1 drink a day. Limit intake if you are breastfeeding. Be aware of how much alcohol is in your drink. In the [place] one drink equals one 12 oz bottle of beer (355 mL), one 5 oz glass of wine (148 mL), or one 1 oz glass of hard liquor (44 mL). General instructions Schedule regular health, dental, and eye exams. Stay current with your vaccines. Tell your health care provider if: You often feel depressed. You have ever been abused or do not feel safe at home. Summary Adopting a healthy lifestyle and getting preventive care are important in promoting health and wellness. Follow your health care provider's instructions about healthy diet, exercising, and getting tested or screened for diseases. Follow your health care provider's  instructions on monitoring your cholesterol and blood pressure. This information is not intended to replace advice given to you by your health care provider. Make sure you discuss any questions you have with your health care provider. Document Revised: 11/05/2020 Document Reviewed: 08/21/2018 Elsevier Patient Education  2022 Else[employer] Inc.   Breast Self-Awareness Breast self-awareness is knowing how your breasts look and feel. Doing breast self-awareness is important. It allows you to catch a breast problem early while it is still small and can be treated. All women should do breast self-awareness, including women who have had breast implants. Tell your doctor if you notice a change in your breasts. What you need: A mirror. A well-lit room. How to do a breast self-exam A breast self-exam is one way to learn what is normal for your breasts and to check for changes. To do a breast self-exam: Look for changes  Take off all the clothes above your waist. Stand in front of a mirror in a room with good lighting. Put your hands on your hips. Push your hands down. Look at your breasts and nipples in the mirror to see if one breast or nipple looks different from the other. Check to see if: The shape of one  is different. The size of one breast is different. There are wrinkles, dips, and bumps in one breast and not the other. Look at each breast for changes in the skin, such as: Redness. Scaly areas. Look for changes in your nipples, such as: Liquid around the nipples. Bleeding. Dimpling. Redness. A change in where the nipples are. Feel for changes  Lie on your back on the floor. Feel each breast. To do this, follow these steps: Pick a breast to feel. Put the arm closest to that breast above your head. Use your other arm to feel the nipple area of your breast. Feel the area with the pads of your three middle fingers by making small circles with your fingers. For the first circle, press  lightly. For the second circle, press harder. For the third circle, press even harder. Keep making circles with your fingers at the different pressures as you move down your breast. Stop when you feel your ribs. Move your fingers a little toward the center of your body. Start making circles with your fingers again, this time going up until you reach your collarbone. Keep making up-and-down circles until you reach your armpit. Remember to keep using the three pressures. Feel the other breast in the same way. Sit or stand in the tub or shower. With soapy water on your skin, feel each breast the same way you did in step 2 when you were lying on the floor. Write down what you find Writing down what you find can help you remember what to tell your doctor. Write down: What is normal for each breast. Any changes you find in each breast, including: The kind of changes you find. Whether you have pain. Size and location of any lumps. When you last had your menstrual period. General tips Check your breasts every month. If you are breastfeeding, the best time to check your breasts is after you feed your baby or after you use a breast pump. If you get menstrual periods, the best time to check your breasts is 5-7 days after your menstrual period is over. With time, you will become comfortable with the self-exam, and you will begin to know if there are changes in your breasts. Contact a doctor if you: See a change in the shape or size of your breasts or nipples. See a change in the skin of your breast or nipples, such as red or scaly skin. Have fluid coming from your nipples that is not normal. Find a lump or thick area that was not there before. Have pain in your breasts. Have any concerns about your breast health. Summary Breast self-awareness includes looking for changes in your breasts, as well as feeling for changes within your breasts. Breast self-awareness should be done in front of a mirror in  a well-lit room. You should check your breasts every month. If you get menstrual periods, the best time to check your breasts is 5-7 days after your menstrual period is over. Let your doctor know of any changes you see in your breasts, including changes in size, changes on the skin, pain or tenderness, or fluid from your nipples that is not normal. This information is not intended to replace advice given to you by your health care provider. Make sure you discuss any questions you have with your health care provider. Document Revised: 04/16/2018 Document Reviewed: 04/16/2018 Elsevier Patient Education  2022 Elsevier Inc.  

## 2021-06-08 ENCOUNTER — Ambulatory Visit (INDEPENDENT_AMBULATORY_CARE_PROVIDER_SITE_OTHER): Payer: Medicare HMO | Admitting: Neurology

## 2021-06-08 ENCOUNTER — Encounter: Payer: Self-pay | Admitting: Neurology

## 2021-06-08 VITALS — BP 117/76 | HR 62 | Ht 64.0 in | Wt 214.0 lb

## 2021-06-08 DIAGNOSIS — G35 Multiple sclerosis: Secondary | ICD-10-CM | POA: Diagnosis not present

## 2021-06-08 DIAGNOSIS — Z5181 Encounter for therapeutic drug level monitoring: Secondary | ICD-10-CM | POA: Diagnosis not present

## 2021-06-08 DIAGNOSIS — R519 Headache, unspecified: Secondary | ICD-10-CM | POA: Diagnosis not present

## 2021-06-08 MED ORDER — VENLAFAXINE HCL ER 37.5 MG PO CP24: ORAL_CAPSULE | ORAL | 3 refills | Status: DC

## 2021-06-08 NOTE — Progress Notes (Signed)
Reason for visit: Multiple sclerosis, headache  Teresa Arnold is an 45 y.o. female  History of present illness:  Teresa Arnold is a 45 year old right-handed black female with a history of multiple sclerosis and obesity.  The patient has chronic daily headaches that are better in the morning, worse as the day goes on unassociated with photophobia, phonophobia, or nausea or vomiting.  The headaches are mainly bifrontal in nature.  She takes Tylenol for the headache.  She was on nortriptyline with good improvement of the headache but she stopped the medication as it was causing some weight gain.  The patient does not work, she stays at home.  She is on Mayzent for her multiple sclerosis with good benefit, she has not noted any new symptoms since last seen.  She reports no vision changes, numbness or weakness that is new, or any changes in balance or bowel or bladder control.  She returns to the office today for further evaluation.  She is followed through ophthalmology.  Past Medical History:  Diagnosis Date   Bronchitis    Chronic insomnia 05/22/2017   GERD (gastroesophageal reflux disease)    Iron deficiency anemia 05/22/2017   Morbid obesity (HCC) 05/22/2017   MS (multiple sclerosis) (HCC)    Vitamin B12 deficiency 11/2019   Vitamin D deficiency 11/2019    Past Surgical History:  Procedure Laterality Date   CESAREAN SECTION     x4    Family History  Problem Relation Age of Onset   Hypertension Mother    Hypertension Other     Social history:  reports that she has never smoked. She has never used smokeless tobacco. She reports that she does not currently use alcohol. She reports that she does not use drugs.    Allergies  Allergen Reactions   Other Shortness Of Breath and Itching    Tampons   Gadolinium Derivatives Nausea And Vomiting    Pt only received half dose of 59ml before getting sick.    Sulfa Antibiotics Itching and Rash   Tysabri [Natalizumab] Rash     Medications:  Prior to Admission medications   Medication Sig Start Date End Date Taking? Authorizing Provider  acetaminophen (TYLENOL) 325 MG tablet Take 650 mg by mouth every 6 (six) hours as needed for moderate pain.    Yes [provider]  MAYZENT 2 MG TABS TAKE 1 TABLET (2MG ) BY  MOUTH ONCE DAILY (DISCARD 3 MONTHS AFTER OPENING  BOTTLE) 11/23/20  Yes 11/25/20, NP  pantoprazole (PROTONIX) 40 MG tablet TAKE 1 TABLET BY MOUTH EVERY DAY 10/15/17  Yes 12/13/17, FNP  valACYclovir (VALTREX) 1000 MG tablet Take 1 tablet (1,000 mg total) by mouth daily. 05/02/21  Yes 05/04/21, NP    ROS:  Out of a complete 14 system review of symptoms, the patient complains only of the following symptoms, and all other reviewed systems are negative.  Mild balance problems Headache  Blood pressure 117/76, pulse 62, height 5\' 4"  (1.626 m), weight 214 lb (97.1 kg).  Physical Exam  General: The patient is alert and cooperative at the time of the examination.  The patient is markedly obese.  Skin: No significant peripheral edema is noted.   Neurologic Exam  Mental status: The patient is alert and oriented x 3 at the time of the examination. The patient has apparent normal recent and remote memory, with an apparently normal attention span and concentration ability.   Cranial nerves: Facial symmetry is  present. Speech is normal, no aphasia or dysarthria is noted. Extraocular movements are full. Visual fields are full.  Pupils are equal, round, and reactive to light.  Discs are flat bilaterally.  Motor: The patient has good strength in all 4 extremities, with exception of 4 -/5 strength with hip flexion on the right.  Sensory examination: Soft touch sensation is symmetric on the face, arms, and legs.  Coordination: The patient has good finger-nose-finger and heel-to-shin bilaterally.  Gait and station: The patient has a slightly wide-based gait, the patient walk  independently.  Tandem gait is unsteady.  Romberg is negative.  Reflexes: Deep tendon reflexes are symmetric.   MRI brain 06/23/20:  IMPRESSION: This MRI of the brain without contrast shows the following: 1.  There are multiple T2/FLAIR hyperintense foci predominantly in the periventricular white matter and also in the brainstem and cerebellum.  The pattern and configuration is consistent with chronic demyelinating plaque associated with multiple sclerosis.  None of the foci appear to be acute.  Compared to the MRI from 02/19/2020, there are no new lesions 2.   The pituitary gland is thinned with and an enlarged sella turcica consistent with an "empty sella".  This could be an incidental finding or due to elevated intracranial pressures.  It is unchanged compared to MRIs from earlier this year and 2018. 3.   There were no acute findings.   * MRI scan images were reviewed online. I agree with the written report.    Assessment/Plan:  1.  Multiple sclerosis  2.  Chronic daily headache/tension headache  The patient will be given a trial on low-dose Effexor to see if this is helpful for her headache.  The patient will continue the Mayzent, blood work will be done today.  She will follow-up here in 6 months, in the future she can be followed through Dr. Epimenio Foot.  Marlan Palau MD 06/08/2021 10:40 AM  [facility] [address] [place], [place] [address]  Phone [phone] Fax [phone]

## 2021-06-09 LAB — CBC WITH DIFFERENTIAL/PLATELET
Basophils Absolute: 0 10*3/uL (ref 0.0–0.2)
Basos: 1 %
EOS (ABSOLUTE): 0.1 10*3/uL (ref 0.0–0.4)
Eos: 2 %
Hematocrit: 32.9 % — ABNORMAL LOW (ref 34.0–46.6)
Hemoglobin: 10.4 g/dL — ABNORMAL LOW (ref 11.1–15.9)
Immature Grans (Abs): 0 10*3/uL (ref 0.0–0.1)
Immature Granulocytes: 1 %
Lymphocytes Absolute: 0.2 10*3/uL — ABNORMAL LOW (ref 0.7–3.1)
Lymphs: 5 %
MCH: 25.6 pg — ABNORMAL LOW (ref 26.6–33.0)
MCHC: 31.6 g/dL (ref 31.5–35.7)
MCV: 81 fL (ref 79–97)
Monocytes Absolute: 0.7 10*3/uL (ref 0.1–0.9)
Monocytes: 17 %
Neutrophils Absolute: 3 10*3/uL (ref 1.4–7.0)
Neutrophils: 74 %
Platelets: 302 10*3/uL (ref 150–450)
RBC: 4.07 x10E6/uL (ref 3.77–5.28)
RDW: 12.6 % (ref 11.7–15.4)
WBC: 4 10*3/uL (ref 3.4–10.8)

## 2021-06-09 LAB — COMPREHENSIVE METABOLIC PANEL
ALT: 15 IU/L (ref 0–32)
AST: 18 IU/L (ref 0–40)
Albumin/Globulin Ratio: 1.5 (ref 1.2–2.2)
Albumin: 4 g/dL (ref 3.8–4.8)
Alkaline Phosphatase: 69 IU/L (ref 44–121)
BUN/Creatinine Ratio: 32 — ABNORMAL HIGH (ref 9–23)
BUN: 22 mg/dL (ref 6–24)
Bilirubin Total: <0.2 mg/dL (ref 0.0–1.2)
CO2: 23 mmol/L (ref 20–29)
Calcium: 8.7 mg/dL (ref 8.7–10.2)
Chloride: 105 mmol/L (ref 96–106)
Creatinine, Ser: 0.68 mg/dL (ref 0.57–1.00)
Globulin, Total: 2.6 g/dL (ref 1.5–4.5)
Glucose: 84 mg/dL (ref 70–99)
Potassium: 4.8 mmol/L (ref 3.5–5.2)
Sodium: 140 mmol/L (ref 134–144)
Total Protein: 6.6 g/dL (ref 6.0–8.5)
eGFR: 111 mL/min/{1.73_m2} (ref >59)

## 2021-06-21 ENCOUNTER — Other Ambulatory Visit: Payer: Self-pay | Admitting: Neurology

## 2021-06-22 ENCOUNTER — Other Ambulatory Visit: Payer: Self-pay | Admitting: Neurology

## 2021-06-22 MED ORDER — VENLAFAXINE HCL ER 75 MG PO CP24: 75.0000 mg | ORAL_CAPSULE | Freq: Every day | ORAL | 1 refills | Status: DC

## 2021-06-30 ENCOUNTER — Ambulatory Visit: Payer: Medicare HMO | Admitting: Skilled Nursing Facility1

## 2021-07-08 ENCOUNTER — Telehealth: Payer: Medicare HMO | Admitting: Emergency Medicine

## 2021-07-08 DIAGNOSIS — R300 Dysuria: Secondary | ICD-10-CM

## 2021-07-08 DIAGNOSIS — R350 Frequency of micturition: Secondary | ICD-10-CM | POA: Diagnosis not present

## 2021-07-09 MED ORDER — NITROFURANTOIN MONOHYD MACRO 100 MG PO CAPS: 100.0000 mg | ORAL_CAPSULE | Freq: Two times a day (BID) | ORAL | 0 refills | Status: AC

## 2021-07-09 NOTE — Progress Notes (Signed)

## 2021-07-09 NOTE — Progress Notes (Signed)
I have spent 5 minutes in review of e-visit questionnaire, review and updating patient chart, medical decision making and response to patient.   Shannon Balthazar, PA-C    

## 2021-07-11 ENCOUNTER — Ambulatory Visit: Payer: Medicare HMO | Admitting: Nurse Practitioner

## 2021-09-13 ENCOUNTER — Telehealth: Payer: Self-pay

## 2021-09-13 NOTE — Telephone Encounter (Signed)
This request has been approved.  Please note any additional information provided by [employer] at the bottom of your screen.This approval authorizes your coverage from 09/11/2021 - 09/10/2022,

## 2021-09-13 NOTE — Telephone Encounter (Signed)
PA for Mayzent has been sent.   (Key: [id number])  Your information has been submitted to [employer]. [employer] will review the request and will issue a decision, typically within 1-3 days from your submission. You can check the updated outcome later by reopening this request.  If [employer] has not responded in 1-3 days or if you have any questions about your ePA request, please contact [employer] at [phone]. If you think there may be a problem with your PA request, use our live chat feature at the bottom right.

## 2021-09-26 ENCOUNTER — Telehealth: Payer: Medicare HMO | Admitting: Physician Assistant

## 2021-09-26 DIAGNOSIS — N390 Urinary tract infection, site not specified: Secondary | ICD-10-CM | POA: Diagnosis not present

## 2021-09-27 MED ORDER — CEPHALEXIN 500 MG PO CAPS: 500.0000 mg | ORAL_CAPSULE | Freq: Two times a day (BID) | ORAL | 0 refills | Status: DC

## 2021-09-27 NOTE — Progress Notes (Signed)

## 2021-11-03 ENCOUNTER — Ambulatory Visit: Payer: Medicare HMO

## 2021-11-08 DIAGNOSIS — Z20822 Contact with and (suspected) exposure to covid-19: Secondary | ICD-10-CM | POA: Diagnosis not present

## 2021-11-08 DIAGNOSIS — Z823 Family history of stroke: Secondary | ICD-10-CM | POA: Diagnosis not present

## 2021-11-08 DIAGNOSIS — G35 Multiple sclerosis: Secondary | ICD-10-CM | POA: Diagnosis not present

## 2021-11-08 DIAGNOSIS — F419 Anxiety disorder, unspecified: Secondary | ICD-10-CM | POA: Diagnosis not present

## 2021-11-08 DIAGNOSIS — Z8249 Family history of ischemic heart disease and other diseases of the circulatory system: Secondary | ICD-10-CM | POA: Diagnosis not present

## 2021-11-08 DIAGNOSIS — K219 Gastro-esophageal reflux disease without esophagitis: Secondary | ICD-10-CM | POA: Diagnosis not present

## 2021-11-08 DIAGNOSIS — R32 Unspecified urinary incontinence: Secondary | ICD-10-CM | POA: Diagnosis not present

## 2021-11-08 DIAGNOSIS — Z882 Allergy status to sulfonamides status: Secondary | ICD-10-CM | POA: Diagnosis not present

## 2021-11-08 DIAGNOSIS — R69 Illness, unspecified: Secondary | ICD-10-CM | POA: Diagnosis not present

## 2021-11-08 DIAGNOSIS — B029 Zoster without complications: Secondary | ICD-10-CM | POA: Diagnosis not present

## 2021-11-08 DIAGNOSIS — Z91041 Radiographic dye allergy status: Secondary | ICD-10-CM | POA: Diagnosis not present

## 2021-11-28 ENCOUNTER — Encounter: Payer: Self-pay | Admitting: Nurse Practitioner

## 2021-11-28 ENCOUNTER — Other Ambulatory Visit: Payer: Self-pay

## 2021-11-28 ENCOUNTER — Ambulatory Visit
Admission: RE | Admit: 2021-11-28 | Discharge: 2021-11-28 | Disposition: A | Discharge status: Home / Self Care | Payer: Medicare HMO | Source: Ambulatory Visit | Attending: Nurse Practitioner | Admitting: Nurse Practitioner

## 2021-11-28 ENCOUNTER — Ambulatory Visit (INDEPENDENT_AMBULATORY_CARE_PROVIDER_SITE_OTHER): Payer: Medicare HMO | Admitting: Nurse Practitioner

## 2021-11-28 VITALS — BP 114/77 | HR 65 | Temp 97.5°F | Ht 65.0 in | Wt 213.8 lb

## 2021-11-28 DIAGNOSIS — R7303 Prediabetes: Secondary | ICD-10-CM

## 2021-11-28 DIAGNOSIS — Z01419 Encounter for gynecological examination (general) (routine) without abnormal findings: Secondary | ICD-10-CM | POA: Diagnosis not present

## 2021-11-28 DIAGNOSIS — G35 Multiple sclerosis: Secondary | ICD-10-CM

## 2021-11-28 DIAGNOSIS — E669 Obesity, unspecified: Secondary | ICD-10-CM | POA: Diagnosis not present

## 2021-11-28 DIAGNOSIS — Z1231 Encounter for screening mammogram for malignant neoplasm of breast: Secondary | ICD-10-CM | POA: Diagnosis not present

## 2021-11-28 NOTE — Patient Instructions (Signed)

## 2021-11-28 NOTE — Progress Notes (Signed)
? ?[facility] [place] ?[place], [place]  [address] ?Phone:  [phone]   Fax:  [phone] ? ? ?Established Patient Office Visit ? ?Subjective:  ?Patient ID: Teresa Arnold, female    DOB: [birth year]  Age: 46 y.o. MRN: [id number] ? ?CC:  ?Chief Complaint  ?Patient presents with  ? Follow-up  ?  Patient is here today for her 6 month follow up and would like to be referred to an ob/gyn for her annual physical and pap smear. Patient is scheduled to do her mammogram today.  ? ? ?HPI ?Teresa Arnold presents for follow up. She  has a past medical history of Bronchitis, Chronic insomnia (05/22/2017), GERD (gastroesophageal reflux disease), Iron deficiency anemia (05/22/2017), Morbid obesity ([place]) (05/22/2017), MS (multiple sclerosis) ([place]), Vitamin B12 deficiency (11/2019), and Vitamin D deficiency (11/2019).  ? ?She is in today for follow up. Denies headache, dizziness, visual changes, shortness of breath, dyspnea on exertion, chest pain, nausea, vomiting or any edema.  ? ?She is requesting GYN referral for Pap.  ?Past Medical History:  ?Diagnosis Date  ? Bronchitis   ? Chronic insomnia 05/22/2017  ? GERD (gastroesophageal reflux disease)   ? Iron deficiency anemia 05/22/2017  ? Morbid obesity ([place]) 05/22/2017  ? MS (multiple sclerosis) ([place])   ? Vitamin B12 deficiency 11/2019  ? Vitamin D deficiency 11/2019  ? ? ?Past Surgical History:  ?Procedure Laterality Date  ? CESAREAN SECTION    ? x4  ? ? ?Family History  ?Problem Relation Age of Onset  ? Hypertension Mother   ? Hypertension Other   ? ? ?Social History  ? ?Socioeconomic History  ? Marital status: Single  ?  Spouse name: Not on file  ? Number of children: 4  ? Years of education: 71  ? Highest education level: Not on file  ?Occupational History  ? Not on file  ?Tobacco Use  ? Smoking status: Never  ? Smokeless tobacco: Never  ?Vaping Use  ? Vaping Use: Never used  ?Substance and Sexual Activity  ? Alcohol use: Not Currently  ?   Alcohol/week: 0.0 standard drinks  ?  Comment: occasionally  ? Drug use: No  ? Sexual activity: Yes  ?  Birth control/protection: None  ?Other Topics Concern  ? Not on file  ?Social History Narrative  ? Patient is single and lives with her family.  ? Patient has four children.  ? Patient has a college education.  ? Patient is right-handed.  ? Patient does not drink any caffeine.  ? ?Social Determinants of Health  ? ?Financial Resource Strain: Medium Risk  ? Difficulty of Paying Living Expenses: Somewhat hard  ?Food Insecurity: Food Insecurity Present  ? Worried About Charity fundraiser in the Last Year: Sometimes true  ? Ran Out of Food in the Last Year: Sometimes true  ?Transportation Needs: No Transportation Needs  ? Lack of Transportation (Medical): No  ? Lack of Transportation (Non-Medical): No  ?Physical Activity: Insufficiently Active  ? Days of Exercise per Week: 1 day  ? Minutes of Exercise per Session: 20 min  ?Stress: No Stress Concern Present  ? Feeling of Stress : Not at all  ?Social Connections: Socially Isolated  ? Frequency of Communication with Friends and Family: Once a week  ? Frequency of Social Gatherings with Friends and Family: Once a week  ? Attends Religious Services: 1 to 4 times per year  ? Active Member of Clubs or Organizations: No  ?  Attends Archivist Meetings: Never  ? Marital Status: Never married  ?Intimate Partner Violence: Not At Risk  ? Fear of Current or Ex-Partner: No  ? Emotionally Abused: No  ? Physically Abused: No  ? Sexually Abused: No  ? ? ?Outpatient Medications Prior to Visit  ?Medication Sig Dispense Refill  ? acetaminophen (TYLENOL) 325 MG tablet Take 650 mg by mouth every 6 (six) hours as needed for moderate pain.     ? MAYZENT 2 MG TABS TAKE 1 TABLET ($RemoveB'2MG'CZwAVawv$ ) BY  MOUTH ONCE DAILY (DISCARD 3 MONTHS AFTER OPENING  BOTTLE) 90 tablet 3  ? pantoprazole (PROTONIX) 40 MG tablet TAKE 1 TABLET BY MOUTH EVERY DAY 30 tablet 10  ? valACYclovir (VALTREX) 1000 MG tablet  Take 1 tablet (1,000 mg total) by mouth daily. 90 tablet 2  ? cephALEXin (KEFLEX) 500 MG capsule Take 1 capsule (500 mg total) by mouth 2 (two) times daily. 14 capsule 0  ? ?No facility-administered medications prior to visit.  ? ? ?Allergies  ?Allergen Reactions  ? Other Shortness Of Breath and Itching  ?  Tampons  ? Gadolinium Derivatives Nausea And Vomiting  ?  Pt only received half dose of 79ml before getting sick.   ? Sulfa Antibiotics Itching and Rash  ? Tysabri [Natalizumab] Rash  ? ? ?ROS ?Review of Systems ? ?  ?Objective:  ?  ?Physical Exam ?Constitutional:   ?   Appearance: She is obese.  ?HENT:  ?   Head: Normocephalic and atraumatic.  ?Cardiovascular:  ?   Rate and Rhythm: Normal rate and regular rhythm.  ?   Pulses: Normal pulses.  ?   Heart sounds: Normal heart sounds.  ?Pulmonary:  ?   Effort: Pulmonary effort is normal.  ?   Breath sounds: Normal breath sounds.  ?Abdominal:  ?   General: Bowel sounds are normal.  ?   Palpations: Abdomen is soft.  ?Musculoskeletal:     ?   General: Normal range of motion.  ?   Cervical back: Normal range of motion.  ?   Comments: Right sided weakness  ?Skin: ?   General: Skin is warm and dry.  ?   Capillary Refill: Capillary refill takes less than 2 seconds.  ?Neurological:  ?   General: No focal deficit present.  ?   Mental Status: She is alert and oriented to person, place, and time.  ?Psychiatric:     ?   Mood and Affect: Mood normal.     ?   Behavior: Behavior normal.     ?   Thought Content: Thought content normal.     ?   Judgment: Judgment normal.  ? ? ?BP 114/77   Pulse 65   Temp (!) 97.5 ?F (36.4 ?C)   Ht $R'5\' 5"'nx$  (1.651 m)   Wt 213 lb 12.8 oz (97 kg)   LMP 11/27/2021 (Exact Date)   SpO2 100%   BMI 35.58 kg/m?  ?Wt Readings from Last 3 Encounters:  ?11/28/21 213 lb 12.8 oz (97 kg)  ?06/08/21 214 lb (97.1 kg)  ?05/30/21 210 lb 12.8 oz (95.6 kg)  ? ? ? ?Health Maintenance Due  ?Topic Date Due  ? PAP SMEAR-Modifier  11/19/2020  ? ? ?There are no preventive  care reminders to display for this patient. ? ?Lab Results  ?Component Value Date  ? TSH 0.965 11/26/2020  ? ?Lab Results  ?Component Value Date  ? WBC 4.0 06/08/2021  ? HGB 10.4 (L) 06/08/2021  ? HCT  32.9 (L) 06/08/2021  ? MCV 81 06/08/2021  ? PLT 302 06/08/2021  ? ?Lab Results  ?Component Value Date  ? NA 140 06/08/2021  ? K 4.8 06/08/2021  ? CO2 23 06/08/2021  ? GLUCOSE 84 06/08/2021  ? BUN 22 06/08/2021  ? CREATININE 0.68 06/08/2021  ? BILITOT <0.2 06/08/2021  ? ALKPHOS 69 06/08/2021  ? AST 18 06/08/2021  ? ALT 15 06/08/2021  ? PROT 6.6 06/08/2021  ? ALBUMIN 4.0 06/08/2021  ? CALCIUM 8.7 06/08/2021  ? ANIONGAP 8 11/13/2016  ? EGFR 111 06/08/2021  ? ?Lab Results  ?Component Value Date  ? CHOL 159 11/26/2020  ? ?Lab Results  ?Component Value Date  ? HDL 65 11/26/2020  ? ?Lab Results  ?Component Value Date  ? [address] 84 11/26/2020  ? ?Lab Results  ?Component Value Date  ? TRIG 44 11/26/2020  ? ?Lab Results  ?Component Value Date  ? CHOLHDL 2.4 11/26/2020  ? ?Lab Results  ?Component Value Date  ? HGBA1C 5.2 05/28/2020  ? HGBA1C 5.2 05/28/2020  ? HGBA1C 5.2 (A) 05/28/2020  ? HGBA1C 5.2 05/28/2020  ? ? ?  ?Assessment & Plan:  ? ?Problem List Items Addressed This Visit   ?None ?Visit Diagnoses   ? ? MS (multiple sclerosis) ([place])    -  Primary ?Stable  ?Continue to follow up with neurology  ?  ? Obesity (BMI 35.0-39.9 without comorbidity)     ?Obesity with BMI and comorbidities as noted above.  ?Discussed proper diet (low fat, low sodium, high fiber) with patient.   ?Discussed need for regular exercise (3 times per week, 20 minutes per session) with patient. ?  ? Morbid obesity ([place])   (Chronic)    ? Prediabetes     ?Consider home glucose monitoring ?Weight loss at least 5% of current body weight is can be achieved with lifestyle modification dietary changes and regular daily exercise ?Encourage blood pressure control goal <120/80 and maintaining total cholesterol <200 ?Follow-up every 3 to 6 months for  reevaluation ?Education material provided ?  ? Gynecologic exam normal      ? Relevant Orders  ? Ambulatory referral to Obstetrics / Gynecology  ? ?  ? ? ?No orders of the defined types were placed in this encounter. ? ? ?Follow-u

## 2021-11-30 ENCOUNTER — Other Ambulatory Visit: Payer: Self-pay | Admitting: Nurse Practitioner

## 2021-11-30 DIAGNOSIS — R928 Other abnormal and inconclusive findings on diagnostic imaging of breast: Secondary | ICD-10-CM

## 2021-12-01 ENCOUNTER — Ambulatory Visit (INDEPENDENT_AMBULATORY_CARE_PROVIDER_SITE_OTHER): Payer: Medicare HMO | Admitting: Nurse Practitioner

## 2021-12-01 ENCOUNTER — Other Ambulatory Visit: Payer: Self-pay

## 2021-12-01 DIAGNOSIS — Z0000 Encounter for general adult medical examination without abnormal findings: Secondary | ICD-10-CM

## 2021-12-01 NOTE — Progress Notes (Signed)
? ?Subjective:  ? Teresa Arnold is a 46 y.o. female who presents for an Initial Medicare Annual Wellness Visit. ? ?I connected with  Bascom Levels on 12/01/21 by a  telephone visit  enabled telemedicine application and verified that I am speaking with the correct person using two identifiers. ? ?Patient Location: Home ? ?Provider Location: Office/Clinic ? ?I discussed the limitations of evaluation and management by telemedicine. The patient expressed understanding and agreed to proceed.  ? ?Review of Systems    ? ?  ? ?   ?Objective:  ?  ?There were no vitals filed for this visit. ?There is no height or weight on file to calculate BMI. ? ? ?  08/02/2020  ?  9:38 AM 08/13/2018  ?  1:43 PM 06/24/2018  ?  2:35 PM 10/28/2017  ?  4:27 AM 11/13/2016  ?  3:53 PM 05/27/2015  ? 11:29 AM 04/13/2015  ?  9:27 AM  ?Advanced Directives  ?Does Patient Have a Medical Advance Directive? Yes No No No No No No  ?Would patient like information on creating a medical advance directive?  No - Patient declined No - Patient declined   Yes - Educational materials given No - patient declined information  ? ? ?Current Medications (verified) ?Outpatient Encounter Medications as of 12/01/2021  ?Medication Sig  ? acetaminophen (TYLENOL) 325 MG tablet Take 650 mg by mouth every 6 (six) hours as needed for moderate pain.   ? MAYZENT 2 MG TABS TAKE 1 TABLET (2MG ) BY  MOUTH ONCE DAILY (DISCARD 3 MONTHS AFTER OPENING  BOTTLE)  ? pantoprazole (PROTONIX) 40 MG tablet TAKE 1 TABLET BY MOUTH EVERY DAY  ? valACYclovir (VALTREX) 1000 MG tablet Take 1 tablet (1,000 mg total) by mouth daily.  ? ?No facility-administered encounter medications on file as of 12/01/2021.  ? ? ?Allergies (verified) ?Other, Gadolinium derivatives, Sulfa antibiotics, and Tysabri [natalizumab]  ? ?History: ?Past Medical History:  ?Diagnosis Date  ? Bronchitis   ? Chronic insomnia 05/22/2017  ? GERD (gastroesophageal reflux disease)   ? Iron deficiency anemia 05/22/2017  ? Morbid  obesity ([place]) 05/22/2017  ? MS (multiple sclerosis) ([place])   ? Vitamin B12 deficiency 11/2019  ? Vitamin D deficiency 11/2019  ? ?Past Surgical History:  ?Procedure Laterality Date  ? CESAREAN SECTION    ? x4  ? ?Family History  ?Problem Relation Age of Onset  ? Hypertension Mother   ? Hypertension Other   ? ?Social History  ? ?Socioeconomic History  ? Marital status: Single  ?  Spouse name: Not on file  ? Number of children: 4  ? Years of education: 97  ? Highest education level: Not on file  ?Occupational History  ? Not on file  ?Tobacco Use  ? Smoking status: Never  ? Smokeless tobacco: Never  ?Vaping Use  ? Vaping Use: Never used  ?Substance and Sexual Activity  ? Alcohol use: Not Currently  ?  Alcohol/week: 0.0 standard drinks  ?  Comment: occasionally  ? Drug use: No  ? Sexual activity: Yes  ?  Birth control/protection: None  ?Other Topics Concern  ? Not on file  ?Social History Narrative  ? Patient is single and lives with her family.  ? Patient has four children.  ? Patient has a college education.  ? Patient is right-handed.  ? Patient does not drink any caffeine.  ? ?Social Determinants of Health  ? ?Financial Resource Strain: Not on file  ?Food Insecurity: Not on file  ?  Transportation Needs: Not on file  ?Physical Activity: Not on file  ?Stress: Not on file  ?Social Connections: Not on file  ? ? ?Tobacco Counseling ?Counseling given: Not Answered ? ? ?Clinical Intake: ? ?  ? ?  ? ?  ? ?  ? ?  ? ?Diabetic?No  ? ?  ? ?  ? ? ?Activities of Daily Living ?   ? View : No data to display.  ?  ?  ?  ? ? ? ?Patient Care Team: ?Vevelyn Francois, NP as PCP - General (Adult Health Nurse Practitioner) ? ?Indicate any recent Medical Services you may have received from other than [facility] providers in the past year (date may be approximate). ? ?   ?Assessment:  ? This is a routine wellness examination for Teresa Arnold. ? ?Hearing/Vision screen ?No results found. ? ?Dietary issues and exercise activities discussed: ?  ? ? Goals  Addressed   ?None ?  ?Depression Screen ? ?  11/28/2021  ? 11:46 AM 05/30/2021  ? 11:46 AM 05/28/2020  ? 10:45 AM 11/21/2019  ? 11:44 AM 05/23/2019  ? 11:18 AM 11/01/2018  ? 11:35 AM 05/22/2018  ? 10:51 AM  ?PHQ 2/9 Scores  ?PHQ - 2 Score 0 0 0 0 0 0 0  ?Exception Documentation   Medical reason      ?  ?Fall Risk ? ?  11/28/2021  ? 11:46 AM 05/30/2021  ? 11:46 AM 05/28/2020  ? 10:45 AM 11/21/2019  ? 11:44 AM 05/23/2019  ? 11:18 AM  ?Fall Risk   ?Falls in the past year? 0 0 0 0 0  ?Number falls in past yr: 0 0 0 0 0  ?Injury with Fall? 0 0 0  0  ?Risk for fall due to : No Fall Risks  No Fall Risks    ?Follow up Falls evaluation completed      ? ? ?FALL RISK PREVENTION PERTAINING TO THE HOME: ? ?Any stairs in or around the home? Yes  ?If so, are there any without handrails? Yes  ?Home free of loose throw rugs in walkways, pet beds, electrical cords, etc? Yes  ?Adequate lighting in your home to reduce risk of falls? Yes  ? ?ASSISTIVE DEVICES UTILIZED TO PREVENT FALLS: ? ?Life alert? No  ?Use of a cane, walker or w/c? No  ?Grab bars in the bathroom? No  ?Shower chair or bench in shower? No  ?Elevated toilet seat or a handicapped toilet? No  ? ?TIMED UP AND GO: ? ?Was the test performed? No .  ?Length of time to ambulate 10 feet: n/a sec.  ? ? ? ?Cognitive Function: ?  ?  ?  ? ?Immunizations ?Immunization History  ?Administered Date(s) Administered  ? Influenza-Unspecified 06/03/2019  ? PFIZER(Purple Top)SARS-COV-2 Vaccination 06/11/2020, 07/02/2020  ? ? ?TDAP status: Due, Education has been provided regarding the importance of this vaccine. Advised may receive this vaccine at local pharmacy or Health Dept. Aware to provide a copy of the vaccination record if obtained from local pharmacy or Health Dept. Verbalized acceptance and understanding. ? ?Flu Vaccine status: Declined, Education has been provided regarding the importance of this vaccine but patient still declined. Advised may receive this vaccine at local pharmacy or Health  Dept. Aware to provide a copy of the vaccination record if obtained from local pharmacy or Health Dept. Verbalized acceptance and understanding. ? ?Pneumococcal vaccine status: Declined,  Education has been provided regarding the importance of this vaccine but patient still declined. Advised may  receive this vaccine at local pharmacy or Health Dept. Aware to provide a copy of the vaccination record if obtained from local pharmacy or Health Dept. Verbalized acceptance and understanding.  ? ?Covid-19 vaccine status: Declined, Education has been provided regarding the importance of this vaccine but patient still declined. Advised may receive this vaccine at local pharmacy or Health Dept.or vaccine clinic. Aware to provide a copy of the vaccination record if obtained from local pharmacy or Health Dept. Verbalized acceptance and understanding. ? ?Qualifies for Shingles Vaccine? No   ?Zostavax completed No   ?Shingrix Completed?: No.    Education has been provided regarding the importance of this vaccine. Patient has been advised to call insurance company to determine out of pocket expense if they have not yet received this vaccine. Advised may also receive vaccine at local pharmacy or Health Dept. Verbalized acceptance and understanding. ? ?Screening Tests ?Health Maintenance  ?Topic Date Due  ? PAP SMEAR-Modifier  11/19/2020  ? INFLUENZA VACCINE  12/09/2021 (Originally 04/11/2021)  ? COVID-19 Vaccine (3 - Pfizer risk series) 12/14/2021 (Originally 07/30/2020)  ? HIV Screening  11/29/2022 (Originally 05/20/1993)  ? TETANUS/TDAP  05/22/2027 (Originally 05/20/1997)  ? Hepatitis C Screening  Completed  ? HPV VACCINES  Aged Out  ? ? ?Health Maintenance ? ?Health Maintenance Due  ?Topic Date Due  ? PAP SMEAR-Modifier  11/19/2020  ? ? ?Colorectal cancer screening: No longer required.  ? ?Mammogram status: Completed 1. Repeat every year ? ? ? ?Lung Cancer Screening: (Low Dose CT Chest recommended if Age 69-80 years, 30 pack-year  currently smoking OR have quit w/in 15years.) does not qualify.  ? ?Lung Cancer Screening Referral: None given  ? ?Additional Screening: ? ?Hepatitis C Screening: does qualify; Completed 01/31/2018 ? ?Vision Scr

## 2021-12-15 ENCOUNTER — Ambulatory Visit
Admission: RE | Admit: 2021-12-15 | Discharge: 2021-12-15 | Disposition: A | Discharge status: Home / Self Care | Payer: Medicare HMO | Source: Ambulatory Visit | Attending: Nurse Practitioner | Admitting: Nurse Practitioner

## 2021-12-15 ENCOUNTER — Other Ambulatory Visit: Payer: Self-pay | Admitting: Nurse Practitioner

## 2021-12-15 DIAGNOSIS — R928 Other abnormal and inconclusive findings on diagnostic imaging of breast: Secondary | ICD-10-CM

## 2021-12-15 DIAGNOSIS — N6310 Unspecified lump in the right breast, unspecified quadrant: Secondary | ICD-10-CM

## 2021-12-18 ENCOUNTER — Other Ambulatory Visit: Payer: Self-pay | Admitting: Nurse Practitioner

## 2021-12-18 DIAGNOSIS — R928 Other abnormal and inconclusive findings on diagnostic imaging of breast: Secondary | ICD-10-CM

## 2021-12-21 ENCOUNTER — Ambulatory Visit (INDEPENDENT_AMBULATORY_CARE_PROVIDER_SITE_OTHER): Payer: 59 | Admitting: Neurology

## 2021-12-21 VITALS — BP 118/80 | HR 75 | Ht 65.0 in | Wt 215.0 lb

## 2021-12-21 DIAGNOSIS — G35 Multiple sclerosis: Secondary | ICD-10-CM

## 2021-12-21 DIAGNOSIS — R519 Headache, unspecified: Secondary | ICD-10-CM

## 2021-12-21 MED ORDER — MAYZENT 2 MG PO TABS: ORAL_TABLET | ORAL | 3 refills | Status: DC

## 2021-12-21 NOTE — Progress Notes (Addendum)
Patient: Teresa Arnold Date of Birth: 05-Oct-1975  Reason for Visit: Follow up for MS, headache History from: Patient Primary Neurologist: Dr. Krista Blue   ASSESSMENT AND PLAN 46 y.o. year old female   1.  Multiple sclerosis -Has remained stable since last seen, continues with right leg chronic weakness -Continue Mayzent once daily (In the past on rebif, Tysabri but developed rash) -Check routine labs today -Consider MRI of the brain imaging at next visit, has n/v with contrast (last MRI brain was in October 2021, was stable) -We talked about increasing exercise/activity, consider water aerobics -Follow-up in 6 months or sooner if needed, will be followed by Dr. Krista Blue since Dr. Jannifer Franklin has retired  2.  Chronic daily headache/tension headache -Has not been able to tolerate Effexor, nortriptyline, Topamax -For now, we will hold off on preventative medication, if increases, consider CGRP, headaches have migraine features  Addendum 02/14/2022 SS: Office visit with Dr. Katy Fitch 02/14/22, reported vision is stable.  MS diagnosed in 2004. RNFL thinning with complete loss of ganglion cell layer bilaterally, indicating she actually had optic neuritis bilaterally.  We will reevaluate exam annually.  Return if vision suddenly worsens.  Optic neuritis and atrophy from MS.  HISTORY OF PRESENT ILLNESS: Today 12/21/21 Teresa Arnold here today for follow-up.  On Mayzent.  Started on Effexor at last visit for headache, stopped it, made her gain weight. Headaches are 2-3 times a week, takes Tylenol with good benefit.  Headaches are bilateral temples, frontal; with migraine features of light and sound sensitivity. MRI of the brain without contrast was in October 2021, overall stable, no new lesions.  Labs in September 2022 showed absolute lymphocyte count 0.2. No falls, denies any new MS symptoms, still chronic weakness to right leg, right arm is normal.  Trying to lose weight, is fasting.   HISTORY 06/08/2021 Dr.  Jannifer Franklin: Teresa Arnold is a 46 year old right-handed black female with a history of multiple sclerosis and obesity.  The patient has chronic daily headaches that are better in the morning, worse as the day goes on unassociated with photophobia, phonophobia, or nausea or vomiting.  The headaches are mainly bifrontal in nature.  She takes Tylenol for the headache.  She was on nortriptyline with good improvement of the headache but she stopped the medication as it was causing some weight gain.  The patient does not work, she stays at home.  She is on Mayzent for her multiple sclerosis with good benefit, she has not noted any new symptoms since last seen.  She reports no vision changes, numbness or weakness that is new, or any changes in balance or bowel or bladder control.  She returns to the office today for further evaluation.  She is followed through ophthalmology.   REVIEW OF SYSTEMS: Out of a complete 14 system review of symptoms, the patient complains only of the following symptoms, and all other reviewed systems are negative.  See HPI  ALLERGIES: Allergies  Allergen Reactions   Other Shortness Of Breath and Itching    Tampons   Gadolinium Derivatives Nausea And Vomiting    Pt only received half dose of 40ml before getting sick.    Sulfa Antibiotics Itching and Rash   Tysabri [Natalizumab] Rash    HOME MEDICATIONS: Outpatient Medications Prior to Visit  Medication Sig Dispense Refill   acetaminophen (TYLENOL) 325 MG tablet Take 650 mg by mouth every 6 (six) hours as needed for moderate pain.      pantoprazole (PROTONIX) 40 MG tablet  TAKE 1 TABLET BY MOUTH EVERY DAY 30 tablet 10   valACYclovir (VALTREX) 1000 MG tablet Take 1 tablet (1,000 mg total) by mouth daily. 90 tablet 2   MAYZENT 2 MG TABS TAKE 1 TABLET (2MG ) BY  MOUTH ONCE DAILY (DISCARD 3 MONTHS AFTER OPENING  BOTTLE) 90 tablet 3   No facility-administered medications prior to visit.    PAST MEDICAL HISTORY: Past Medical History:   Diagnosis Date   Bronchitis    Chronic insomnia 05/22/2017   GERD (gastroesophageal reflux disease)    Iron deficiency anemia 05/22/2017   Morbid obesity ([place]) 05/22/2017   MS (multiple sclerosis) ([place])    Vitamin B12 deficiency 11/2019   Vitamin D deficiency 11/2019    PAST SURGICAL HISTORY: Past Surgical History:  Procedure Laterality Date   CESAREAN SECTION     x4    FAMILY HISTORY: Family History  Problem Relation Age of Onset   Hypertension Mother    Hypertension Other     SOCIAL HISTORY: Social History   Socioeconomic History   Marital status: Single    Spouse name: Not on file   Number of children: 4   Years of education: 14   Highest education level: Not on file  Occupational History   Not on file  Tobacco Use   Smoking status: Never   Smokeless tobacco: Never  Vaping Use   Vaping Use: Never used  Substance and Sexual Activity   Alcohol use: Not Currently    Alcohol/week: 0.0 standard drinks    Comment: occasionally   Drug use: No   Sexual activity: Yes    Birth control/protection: None  Other Topics Concern   Not on file  Social History Narrative   Patient is single and lives with her family.   Patient has four children.   Patient has a college education.   Patient is right-handed.   Patient does not drink any caffeine.   Social Determinants of Health   Financial Resource Strain: Medium Risk   Difficulty of Paying Living Expenses: Somewhat hard  Food Insecurity: Food Insecurity Present   Worried About [place] in the Last Year: Sometimes true   Ran Out of Food in the Last Year: Sometimes true  Transportation Needs: No Transportation Needs   Lack of Transportation (Medical): No   Lack of Transportation (Non-Medical): No  Physical Activity: Insufficiently Active   Days of Exercise per Week: 1 day   Minutes of Exercise per Session: 20 min  Stress: No Stress Concern Present   Feeling of Stress : Not at all  Social Connections:  Socially Isolated   Frequency of Communication with Friends and Family: Once a week   Frequency of Social Gatherings with Friends and Family: Once a week   Attends Religious Services: 1 to 4 times per year   Active Member of [employer] or Organizations: No   Attends Archivist Meetings: Never   Marital Status: Never married  Human resources officer Violence: Not At Risk   Fear of Current or Ex-Partner: No   Emotionally Abused: No   Physically Abused: No   Sexually Abused: No    PHYSICAL EXAM  Vitals:   12/21/21 1107  BP: 118/80  Pulse: 75  Weight: 215 lb (97.5 kg)  Height: 5\' 5"  (1.651 m)   Body mass index is 35.78 kg/m.  Generalized: Well developed, in no acute distress  Neurological examination  Mentation: Alert oriented to time, place, history taking. Follows all commands speech and language fluent  Cranial nerve II-XII: Pupils were equal round reactive to light. Extraocular movements were full, visual field were full on confrontational test. Facial sensation and strength were normal. Head turning and shoulder shrug  were normal and symmetric. Motor: Good strength all extremities, exception 4/5 right hip flexion Sensory: Sensory testing is intact to soft touch on all 4 extremities. No evidence of extinction is noted.  Coordination: Cerebellar testing reveals good finger-nose-finger and heel-to-shin bilaterally. Lifting right leg is heavy. Gait and station: Gait is slightly wide-based, walks independently, tendency to favor the right leg Reflexes: Deep tendon reflexes are symmetric and normal bilaterally.   DIAGNOSTIC DATA (LABS, IMAGING, TESTING) - I reviewed patient records, labs, notes, testing and imaging myself where available.  Lab Results  Component Value Date   WBC 4.0 06/08/2021   HGB 10.4 (L) 06/08/2021   HCT 32.9 (L) 06/08/2021   MCV 81 06/08/2021   PLT 302 06/08/2021      Component Value Date/Time   NA 140 06/08/2021 1112   K 4.8 06/08/2021 1112   CL 105  06/08/2021 1112   CO2 23 06/08/2021 1112   GLUCOSE 84 06/08/2021 1112   GLUCOSE 86 05/21/2017 1142   BUN 22 06/08/2021 1112   CREATININE 0.68 06/08/2021 1112   CREATININE 0.67 05/21/2017 1142   CALCIUM 8.7 06/08/2021 1112   PROT 6.6 06/08/2021 1112   ALBUMIN 4.0 06/08/2021 1112   AST 18 06/08/2021 1112   ALT 15 06/08/2021 1112   ALKPHOS 69 06/08/2021 1112   BILITOT <0.2 06/08/2021 1112   GFRNONAA 110 06/08/2020 1018   GFRNONAA 111 05/21/2017 1142   GFRAA 127 06/08/2020 1018   GFRAA 128 05/21/2017 1142   Lab Results  Component Value Date   CHOL 159 11/26/2020   HDL 65 11/26/2020   LDLCALC 84 11/26/2020   TRIG 44 11/26/2020   CHOLHDL 2.4 11/26/2020   Lab Results  Component Value Date   HGBA1C 5.2 05/28/2020   HGBA1C 5.2 05/28/2020   HGBA1C 5.2 (A) 05/28/2020   HGBA1C 5.2 05/28/2020   Lab Results  Component Value Date   VITAMINB12 387 11/26/2020   Lab Results  Component Value Date   TSH 0.965 11/26/2020    Butler Denmark, AGNP-C, DNP 12/21/2021, 12:15 PM [facility] [address] [place], [place] [address] [phone]

## 2021-12-21 NOTE — Patient Instructions (Signed)
Continue Mayzent  ?Check labs today  ?Call if headaches worsen  ?See you back in 6 months with Dr. Krista Blue  ? ?

## 2021-12-22 LAB — COMPREHENSIVE METABOLIC PANEL
ALT: 11 IU/L (ref 0–32)
AST: 14 IU/L (ref 0–40)
Albumin/Globulin Ratio: 1.4 (ref 1.2–2.2)
Albumin: 4.2 g/dL (ref 3.8–4.8)
Alkaline Phosphatase: 71 IU/L (ref 44–121)
BUN/Creatinine Ratio: 18 (ref 9–23)
BUN: 13 mg/dL (ref 6–24)
Bilirubin Total: 0.3 mg/dL (ref 0.0–1.2)
CO2: 25 mmol/L (ref 20–29)
Calcium: 9.1 mg/dL (ref 8.7–10.2)
Chloride: 104 mmol/L (ref 96–106)
Creatinine, Ser: 0.74 mg/dL (ref 0.57–1.00)
Globulin, Total: 2.9 g/dL (ref 1.5–4.5)
Glucose: 77 mg/dL (ref 70–99)
Potassium: 4.2 mmol/L (ref 3.5–5.2)
Sodium: 142 mmol/L (ref 134–144)
Total Protein: 7.1 g/dL (ref 6.0–8.5)
eGFR: 103 mL/min/{1.73_m2} (ref >59)

## 2021-12-22 LAB — CBC WITH DIFFERENTIAL/PLATELET
Basophils Absolute: 0 10*3/uL (ref 0.0–0.2)
Basos: 0 %
EOS (ABSOLUTE): 0 10*3/uL (ref 0.0–0.4)
Eos: 1 %
Hematocrit: 36.6 % (ref 34.0–46.6)
Hemoglobin: 11.4 g/dL (ref 11.1–15.9)
Immature Grans (Abs): 0 10*3/uL (ref 0.0–0.1)
Immature Granulocytes: 1 %
Lymphocytes Absolute: 0.2 10*3/uL — ABNORMAL LOW (ref 0.7–3.1)
Lymphs: 5 %
MCH: 25.1 pg — ABNORMAL LOW (ref 26.6–33.0)
MCHC: 31.1 g/dL — ABNORMAL LOW (ref 31.5–35.7)
MCV: 81 fL (ref 79–97)
Monocytes Absolute: 0.5 10*3/uL (ref 0.1–0.9)
Monocytes: 16 %
Neutrophils Absolute: 2.6 10*3/uL (ref 1.4–7.0)
Neutrophils: 77 %
Platelets: 340 10*3/uL (ref 150–450)
RBC: 4.54 x10E6/uL (ref 3.77–5.28)
RDW: 12.3 % (ref 11.7–15.4)
WBC: 3.3 10*3/uL — ABNORMAL LOW (ref 3.4–10.8)

## 2021-12-22 LAB — VITAMIN D 25 HYDROXY (VIT D DEFICIENCY, FRACTURES): Vit D, 25-Hydroxy: 17.2 ng/mL — ABNORMAL LOW (ref 30.0–100.0)

## 2021-12-26 NOTE — Progress Notes (Signed)
Chart reviewed, agree above plan ?

## 2022-02-05 ENCOUNTER — Other Ambulatory Visit: Payer: Self-pay | Admitting: Nurse Practitioner

## 2022-02-13 ENCOUNTER — Ambulatory Visit (INDEPENDENT_AMBULATORY_CARE_PROVIDER_SITE_OTHER): Payer: 59 | Admitting: Obstetrics and Gynecology

## 2022-02-13 ENCOUNTER — Other Ambulatory Visit (HOSPITAL_COMMUNITY)
Admission: RE | Admit: 2022-02-13 | Discharge: 2022-02-13 | Disposition: A | Discharge status: Home / Self Care | Payer: 59 | Source: Ambulatory Visit | Attending: Obstetrics and Gynecology | Admitting: Obstetrics and Gynecology

## 2022-02-13 ENCOUNTER — Encounter: Payer: Self-pay | Admitting: Obstetrics and Gynecology

## 2022-02-13 VITALS — BP 114/74 | HR 63 | Ht 64.0 in | Wt 215.2 lb

## 2022-02-13 DIAGNOSIS — Z01419 Encounter for gynecological examination (general) (routine) without abnormal findings: Secondary | ICD-10-CM | POA: Insufficient documentation

## 2022-02-13 DIAGNOSIS — A599 Trichomoniasis, unspecified: Secondary | ICD-10-CM

## 2022-02-13 DIAGNOSIS — Z1151 Encounter for screening for human papillomavirus (HPV): Secondary | ICD-10-CM | POA: Diagnosis not present

## 2022-02-13 NOTE — Progress Notes (Signed)
NGYN pt in office to establish care.  Last PAP 11/2017, Last mammogram 06/2021. No GYN concerns at this time.

## 2022-02-13 NOTE — Progress Notes (Signed)
   WELL-WOMAN PHYSICAL & PAP Patient name: Teresa Arnold MRN [id number]  Date of birth: 12-23-1975 Chief Complaint:   Establish Care  History of Present Illness:   Teresa Arnold is a 46 y.o. [id number] African American female being seen today to establish GYN care and a routine well-woman exam with pap.  Current complaints: none  PCP: Thad Ranger, NP      does not desire labs Patient's last menstrual period was 01/21/2022. The current method of family planning is rhythm method.  Last pap 11/19/2017. Results were: normal Last mammogram: 2023. Results were: abnormal  f/u scheduled for 06/2022 . Family h/o breast cancer: No Last colonoscopy: n/a. Family h/o colorectal cancer: No Review of Systems:   Pertinent items are noted in HPI Denies any headaches, blurred vision, fatigue, shortness of breath, chest pain, abdominal pain, abnormal vaginal discharge/itching/odor/irritation, problems with periods, bowel movements, urination, or intercourse unless otherwise stated above. Pertinent History Reviewed:  Reviewed past medical,surgical, social and family history.  Reviewed problem list, medications and allergies. Physical Assessment:   Vitals:   02/13/22 1315  BP: 114/74  Pulse: 63  Weight: 215 lb 3.2 oz (97.6 kg)  Height: 5\' 4"  (1.626 m)  Body mass index is 36.94 kg/m.        Physical Examination:   General appearance - well appearing, and in no distress  Mental status - alert, oriented to person, place, and time  Psych:  She has a normal mood and affect  Skin - warm and dry, normal color, no suspicious lesions noted  Chest - effort normal, all lung fields clear to auscultation bilaterally  Heart - normal rate and regular rhythm  Neck:  midline trachea, no thyromegaly or nodules  Breasts - breasts appear normal, no suspicious masses, no skin or nipple changes or  axillary nodes  Abdomen - soft, nontender, nondistended, no masses or organomegaly  Pelvic - VULVA: normal  appearing vulva with no masses, tenderness or lesions  VAGINA: normal appearing vagina with normal color and discharge, no lesions  CERVIX: normal appearing cervix without discharge or lesions, no CMT  Thin prep pap is done with HR HPV cotesting  UTERUS: uterus is felt to be normal size, shape, consistency and nontender   ADNEXA: No adnexal masses or tenderness noted.  Rectal - deferred  Extremities:  No swelling or varicosities noted  No results found for this or any previous visit (from the past 24 hour(s)).  Assessment & Plan:  1) Well woman exam with routine gynecological exam  - Cytology - PAP( [facility])   Labs/procedures today: pap with HPV co-testing  Mammogram 06/2022 or sooner if problems Colonoscopy at age 60 or sooner if problems  No orders of the defined types were placed in this encounter.   Meds: No orders of the defined types were placed in this encounter.   Follow-up: Return in about 1 year (around 02/14/2023) for Annual Exam.  04/16/2023 MSN, CNM 02/13/2022 1:47 PM

## 2022-02-16 LAB — CYTOLOGY - PAP
Comment: NEGATIVE
Diagnosis: NEGATIVE
High risk HPV: NEGATIVE

## 2022-02-17 ENCOUNTER — Other Ambulatory Visit: Payer: Self-pay | Admitting: Obstetrics and Gynecology

## 2022-02-17 DIAGNOSIS — Z01419 Encounter for gynecological examination (general) (routine) without abnormal findings: Secondary | ICD-10-CM | POA: Insufficient documentation

## 2022-02-17 DIAGNOSIS — A599 Trichomoniasis, unspecified: Secondary | ICD-10-CM | POA: Insufficient documentation

## 2022-02-17 MED ORDER — METRONIDAZOLE 500 MG PO TABS: 500.0000 mg | ORAL_TABLET | Freq: Two times a day (BID) | ORAL | 0 refills | Status: DC

## 2022-02-17 NOTE — Addendum Note (Signed)
Addended by: Kenard Gower on: 02/17/2022 07:09 PM   Modules accepted: Orders

## 2022-02-20 ENCOUNTER — Other Ambulatory Visit: Payer: Self-pay | Admitting: Obstetrics and Gynecology

## 2022-02-20 DIAGNOSIS — A599 Trichomoniasis, unspecified: Secondary | ICD-10-CM

## 2022-02-20 MED ORDER — METRONIDAZOLE 500 MG PO TABS: 500.0000 mg | ORAL_TABLET | Freq: Two times a day (BID) | ORAL | 0 refills | Status: DC

## 2022-02-21 ENCOUNTER — Telehealth: Payer: Self-pay

## 2022-02-21 NOTE — Telephone Encounter (Signed)
This pt called because her metrometroNIDAZOLE (FLAGYL) 500 MG wasn't called her the [facility] [address] can we check on this

## 2022-03-08 ENCOUNTER — Ambulatory Visit: Payer: 59

## 2022-03-21 ENCOUNTER — Ambulatory Visit (INDEPENDENT_AMBULATORY_CARE_PROVIDER_SITE_OTHER): Payer: 59

## 2022-03-21 ENCOUNTER — Other Ambulatory Visit (HOSPITAL_COMMUNITY)
Admission: RE | Admit: 2022-03-21 | Discharge: 2022-03-21 | Disposition: A | Discharge status: Home / Self Care | Payer: 59 | Source: Ambulatory Visit | Attending: Obstetrics & Gynecology | Admitting: Obstetrics & Gynecology

## 2022-03-21 VITALS — BP 103/72 | HR 85 | Ht 64.0 in | Wt 210.0 lb

## 2022-03-21 DIAGNOSIS — N898 Other specified noninflammatory disorders of vagina: Secondary | ICD-10-CM | POA: Diagnosis present

## 2022-03-21 NOTE — Progress Notes (Signed)
SUBJECTIVE:  46 y.o. female complains of white vaginal discharge for 4 day(s). Denies abnormal vaginal bleeding or significant pelvic pain or fever. No UTI symptoms. Denies history of known exposure to STD.  Patient's last menstrual period was 02/20/2022 (exact date).  OBJECTIVE:  She appears well, afebrile. Urine dipstick: not done.  ASSESSMENT:  Vaginal Discharge  Vaginal Odor   PLAN:  GC, chlamydia, trichomonas, BVAG, CVAG probe sent to lab. Treatment: To be determined once lab results are received ROV prn if symptoms persist or worsen.

## 2022-03-22 LAB — CERVICOVAGINAL ANCILLARY ONLY
Bacterial Vaginitis (gardnerella): POSITIVE — AB
Candida Glabrata: NEGATIVE
Candida Vaginitis: NEGATIVE
Chlamydia: NEGATIVE
Comment: NEGATIVE
Comment: NEGATIVE
Comment: NEGATIVE
Comment: NEGATIVE
Comment: NEGATIVE
Comment: NORMAL
Neisseria Gonorrhea: NEGATIVE
Trichomonas: NEGATIVE

## 2022-03-28 ENCOUNTER — Telehealth: Payer: Self-pay | Admitting: Emergency Medicine

## 2022-03-28 NOTE — Telephone Encounter (Signed)
Incoming TC from patient with questions about results and treatment. Pt reports no BV symptoms and declines tx at this time.

## 2022-04-04 NOTE — Progress Notes (Signed)
Patient was assessed and managed by nursing staff during this encounter. I have reviewed the chart and agree with the documentation and plan. I have also made any necessary editorial changes.  Scheryl Darter, MD 04/04/2022 2:50 PM

## 2022-06-19 ENCOUNTER — Ambulatory Visit
Admission: RE | Admit: 2022-06-19 | Discharge: 2022-06-19 | Disposition: A | Discharge status: Home / Self Care | Payer: 59 | Source: Ambulatory Visit | Attending: Nurse Practitioner | Admitting: Nurse Practitioner

## 2022-06-19 ENCOUNTER — Other Ambulatory Visit: Payer: Self-pay | Admitting: Nurse Practitioner

## 2022-06-19 DIAGNOSIS — N6310 Unspecified lump in the right breast, unspecified quadrant: Secondary | ICD-10-CM

## 2022-06-22 ENCOUNTER — Ambulatory Visit: Payer: 59 | Admitting: Neurology

## 2022-07-07 ENCOUNTER — Ambulatory Visit (INDEPENDENT_AMBULATORY_CARE_PROVIDER_SITE_OTHER): Payer: 59

## 2022-07-07 ENCOUNTER — Other Ambulatory Visit: Payer: Self-pay | Admitting: Obstetrics and Gynecology

## 2022-07-07 VITALS — BP 113/74 | HR 65

## 2022-07-07 DIAGNOSIS — N912 Amenorrhea, unspecified: Secondary | ICD-10-CM

## 2022-07-07 DIAGNOSIS — Z3480 Encounter for supervision of other normal pregnancy, unspecified trimester: Secondary | ICD-10-CM

## 2022-07-07 LAB — POCT URINE PREGNANCY: Preg Test, Ur: POSITIVE — AB

## 2022-07-07 MED ORDER — CITRANATAL BLOOM 90-1 MG PO TABS: 1.0000 | ORAL_TABLET | Freq: Every day | ORAL | 11 refills | Status: DC

## 2022-07-07 NOTE — Progress Notes (Signed)
Teresa Arnold presents today for UPT. She has no unusual complaints. LMP: 05/22/22 EDD 02/26/23 [redacted]w[redacted]d today.    OBJECTIVE: Appears well, in no apparent distress.  OB History     Gravida  6   Para  4   Term  4   Preterm  0   AB  1   Living  4      SAB  1   IAB  0   Ectopic  0   Multiple  0   Live Births  4          Home UPT Result: positive In-Office UPT result: positive I have reviewed the patient's medical, obstetrical, social, and family histories, and medications.   ASSESSMENT: Positive pregnancy test  PLAN Prenatal care to be completed at: [facility] Intake/ Dating and viability [place] [facility] Provider visit to be scheduled Prenatal vitamin sent to the pharmacy

## 2022-07-10 ENCOUNTER — Other Ambulatory Visit: Payer: Self-pay | Admitting: Obstetrics and Gynecology

## 2022-07-10 ENCOUNTER — Encounter: Payer: Self-pay | Admitting: Neurology

## 2022-07-10 ENCOUNTER — Ambulatory Visit (INDEPENDENT_AMBULATORY_CARE_PROVIDER_SITE_OTHER): Payer: 59 | Admitting: Neurology

## 2022-07-10 VITALS — BP 118/79 | HR 73 | Ht 64.0 in | Wt 210.5 lb

## 2022-07-10 DIAGNOSIS — R269 Unspecified abnormalities of gait and mobility: Secondary | ICD-10-CM | POA: Diagnosis not present

## 2022-07-10 DIAGNOSIS — Z3480 Encounter for supervision of other normal pregnancy, unspecified trimester: Secondary | ICD-10-CM

## 2022-07-10 DIAGNOSIS — G35 Multiple sclerosis: Secondary | ICD-10-CM

## 2022-07-10 NOTE — Progress Notes (Signed)
Chief Complaint  Patient presents with   Follow-up    Rm 13. Alone. Pt would like to discuss alternative to mayzent. Discontinued medication due to pregnancy.      ASSESSMENT AND PLAN  Teresa Arnold is a 46 y.o. female   Relapsing-remitting multiple sclerosis, Currently pregnant, due date June 2024  Diagnosis was made in 2012, 6 months postpartum, presenting with gait abnormality, last flareup was around 2017 for left optic neuritis,  Treatment: Rebif 2015-07/2017, responding well to IV steroid in April 2017 for her left optic neuritis,  Allergic reaction to Tysabri, diffuse body rash while receiving infusion  Was treated with Rebif from diagnosis until November 2018,  Mayzent since November 2020  Currently [redacted] weeks pregnant, due date June 2024, has stopped Mayzent since July 09 2022.  Laboratory evaluations including CBC with differentiation, thyroid functional test,  Hold off on immuno modulation therapy for multiple sclerosis, patient is advised to call clinic to have any flareups,  Prenatal vitamin plus folic acid 1 mg daily  Vit D deficiency  Over-the-counter D3 supplement, at least 1000 units daily    DIAGNOSTIC DATA (LABS, IMAGING, TESTING) - I reviewed patient records, labs, notes, testing and imaging myself where available.   MEDICAL HISTORY:  Teresa Arnold is a 46 year old female, patient of Dr. Jannifer Franklin, following up for relapsing remitting multiple sclerosis, currently pregnant,   I reviewed and summarized the referring note. PMHx. Relapsing remitting multiple sclerosis Iron deficiency anemia B12 deficiency D deficiency  She has 4 adult children, unexpected pregnancy, due day is in June 2024  She was diagnosed of relapsing remitting multiple sclerosis 6 months postpartum in 2012, presenting with balance difficulty, numbness of bilateral lower extremities  At her worst, she has to use walker, also developed left visual loss, fortunately  symptoms has greatly recovered following IV Solu-Medrol treatment  She was treated with lipid since diagnosis until 2018, had allergic reaction to [place] in the past diffuse body rash, has been treated with Mayzent since 2018, tolerating it well, no significant flareup  Last reported flareup once in 2018 with left optic neuritis   PHYSICAL EXAM:   Vitals:   07/10/22 1008  BP: 118/79  Pulse: 73  Weight: 210 lb 8 oz (95.5 kg)  Height: 5\' 4"  (1.626 m)   Not recorded     Body mass index is 36.13 kg/m.  PHYSICAL EXAMNIATION:  Gen: NAD, conversant, well nourised, well groomed                     Cardiovascular: Regular rate rhythm, no peripheral edema, warm, nontender. Eyes: Conjunctivae clear without exudates or hemorrhage Neck: Supple, no carotid bruits. Pulmonary: Clear to auscultation bilaterally   NEUROLOGICAL EXAM:  MENTAL STATUS: Speech/cognition: Awake, alert, oriented to history taking and casual conversation CRANIAL NERVES: CN II: Visual fields are full to confrontation. Pupils are round equal and briskly reactive to light.  OD visual acuity 20/40, OS 20/70 CN III, IV, VI: extraocular movement are normal. No ptosis. CN V: Facial sensation is intact to light touch CN VII: Face is symmetric with normal eye closure  CN VIII: Hearing is normal to causal conversation. CN IX, X: Phonation is normal. CN XI: Head turning and shoulder shrug are intact  MOTOR: Mild left arm external rotation weakness, mild fixation of right arm on rapid rotating movement, Bilateral hip flexion weakness, right side moderate, left side mild, mild right ankle dorsiflexion weakness  REFLEXES: Hyperreflexia of right arm,  lower extremity, right Babinski signs,  SENSORY: Decreased light touch, pinprick, vibratory sensation, more on right side to knee level  COORDINATION: There is no trunk or limb dysmetria noted.  GAIT/STANCE: Need push-up to get up from seated position, dragging right  leg, mildly unsteady, also limited by her big body habitus  REVIEW OF SYSTEMS:  Full 14 system review of systems performed and notable only for as above All other review of systems were negative.   ALLERGIES: Allergies  Allergen Reactions   Other Shortness Of Breath and Itching    Tampons   Gadolinium Derivatives Nausea And Vomiting    Pt only received half dose of 32ml before getting sick.    Sulfa Antibiotics Itching and Rash   Tysabri [Natalizumab] Rash    HOME MEDICATIONS: Current Outpatient Medications  Medication Sig Dispense Refill   acetaminophen (TYLENOL) 325 MG tablet Take 650 mg by mouth every 6 (six) hours as needed for moderate pain.      pantoprazole (PROTONIX) 40 MG tablet TAKE 1 TABLET BY MOUTH EVERY DAY 30 tablet 10   Prenat w/o A FeCbnFeGlu-FA &B6 (CITRANATAL B-CALM) 20-1 MG & 2 x 25 MG MISC TAKE 1 TABLET BY MOUTH EVERY DAY 30 each 11   valACYclovir (VALTREX) 1000 MG tablet Take 1 tablet (1,000 mg total) by mouth daily. 90 tablet 2   Siponimod Fumarate (MAYZENT) 2 MG TABS TAKE 1 TABLET (2MG ) BY  MOUTH ONCE DAILY (DISCARD 3 MONTHS AFTER OPENING  BOTTLE) (Patient not taking: Reported on 07/10/2022) 90 tablet 3   No current facility-administered medications for this visit.    PAST MEDICAL HISTORY: Past Medical History:  Diagnosis Date   Bronchitis    Chronic insomnia 05/22/2017   GERD (gastroesophageal reflux disease)    Iron deficiency anemia 05/22/2017   Morbid obesity ([place]) 05/22/2017   MS (multiple sclerosis) ([place])    Vitamin B12 deficiency 11/2019   Vitamin D deficiency 11/2019    PAST SURGICAL HISTORY: Past Surgical History:  Procedure Laterality Date   CESAREAN SECTION  04/26/1996   x4   CESAREAN SECTION  04/09/2007   CESAREAN SECTION  01/20/2009   CESAREAN SECTION  02/19/2010    FAMILY HISTORY: Family History  Problem Relation Age of Onset   Hypertension Mother    Hypertension Other     SOCIAL HISTORY: Social History   Socioeconomic  History   Marital status: Single    Spouse name: Not on file   Number of children: 4   Years of education: 14   Highest education level: Not on file  Occupational History   Not on file  Tobacco Use   Smoking status: Never   Smokeless tobacco: Never  Vaping Use   Vaping Use: Never used  Substance and Sexual Activity   Alcohol use: Not Currently    Comment: not in 3 years   Drug use: No   Sexual activity: Yes    Partners: Male    Birth control/protection: None  Other Topics Concern   Not on file  Social History Narrative   Patient is single and lives with her family.   Patient has four children.   Patient has a college education.   Patient is right-handed.   Patient does not drink any caffeine.   Social Determinants of Health   Financial Resource Strain: Medium Risk (12/01/2021)   Overall Financial Resource Strain (CARDIA)    Difficulty of Paying Living Expenses: Somewhat hard  Food Insecurity: Food Insecurity Present (12/01/2021)   Hunger Vital  Sign    Worried About Charity fundraiser in the Last Year: Sometimes true    Ran Out of Food in the Last Year: Sometimes true  Transportation Needs: No Transportation Needs (12/01/2021)   PRAPARE - Hydrologist (Medical): No    Lack of Transportation (Non-Medical): No  Physical Activity: Insufficiently Active (12/01/2021)   Exercise Vital Sign    Days of Exercise per Week: 1 day    Minutes of Exercise per Session: 20 min  Stress: No Stress Concern Present (12/01/2021)   [place]    Feeling of Stress : Not at all  Social Connections: Socially Isolated (12/01/2021)   Social Connection and Isolation Panel [NHANES]    Frequency of Communication with Friends and Family: Once a week    Frequency of Social Gatherings with Friends and Family: Once a week    Attends Religious Services: 1 to 4 times per year    Active Member of [employer] or  Organizations: No    Attends Archivist Meetings: Never    Marital Status: Never married  Intimate Partner Violence: Not At Risk (12/01/2021)   Humiliation, Afraid, Rape, and Kick questionnaire    Fear of Current or Ex-Partner: No    Emotionally Abused: No    Physically Abused: No    Sexually Abused: No      Marcial Pacas, M.D. Ph.D.  [facility] [address] [place], [place] [address] Ph: [phone] Fax: [phone]   Total time spent reviewing the chart, obtaining history, examined patient, ordering tests, documentation, consultations and family, care coordination was  56 minutes

## 2022-07-12 ENCOUNTER — Other Ambulatory Visit: Payer: Self-pay

## 2022-07-12 DIAGNOSIS — Z3480 Encounter for supervision of other normal pregnancy, unspecified trimester: Secondary | ICD-10-CM

## 2022-07-12 MED ORDER — VITAFOL ULTRA 29-0.6-0.4-200 MG PO CAPS: 1.0000 | ORAL_CAPSULE | Freq: Every day | ORAL | 11 refills | Status: DC

## 2022-07-24 LAB — CBC WITH DIFFERENTIAL/PLATELET
Basophils Absolute: 0 10*3/uL (ref 0.0–0.2)
Basos: 0 %
EOS (ABSOLUTE): 0 10*3/uL (ref 0.0–0.4)
Eos: 1 %
Hematocrit: 31.5 % — ABNORMAL LOW (ref 34.0–46.6)
Hemoglobin: 10 g/dL — ABNORMAL LOW (ref 11.1–15.9)
Immature Grans (Abs): 0 10*3/uL (ref 0.0–0.1)
Immature Granulocytes: 1 %
Lymphocytes Absolute: 0.2 10*3/uL — ABNORMAL LOW (ref 0.7–3.1)
Lymphs: 5 %
MCH: 25.4 pg — ABNORMAL LOW (ref 26.6–33.0)
MCHC: 31.7 g/dL (ref 31.5–35.7)
MCV: 80 fL (ref 79–97)
Monocytes Absolute: 0.6 10*3/uL (ref 0.1–0.9)
Monocytes: 15 %
Neutrophils Absolute: 2.8 10*3/uL (ref 1.4–7.0)
Neutrophils: 78 %
Platelets: 322 10*3/uL (ref 150–450)
RBC: 3.93 x10E6/uL (ref 3.77–5.28)
RDW: 12.4 % (ref 11.7–15.4)
WBC: 3.6 10*3/uL (ref 3.4–10.8)

## 2022-07-24 LAB — COMPREHENSIVE METABOLIC PANEL
ALT: 12 IU/L (ref 0–32)
AST: 14 IU/L (ref 0–40)
Albumin/Globulin Ratio: 1.5 (ref 1.2–2.2)
Albumin: 3.8 g/dL — ABNORMAL LOW (ref 3.9–4.9)
Alkaline Phosphatase: 62 IU/L (ref 44–121)
BUN/Creatinine Ratio: 20 (ref 9–23)
BUN: 13 mg/dL (ref 6–24)
Bilirubin Total: 0.2 mg/dL (ref 0.0–1.2)
CO2: 23 mmol/L (ref 20–29)
Calcium: 8.9 mg/dL (ref 8.7–10.2)
Chloride: 103 mmol/L (ref 96–106)
Creatinine, Ser: 0.64 mg/dL (ref 0.57–1.00)
Globulin, Total: 2.6 g/dL (ref 1.5–4.5)
Glucose: 82 mg/dL (ref 70–99)
Potassium: 4.4 mmol/L (ref 3.5–5.2)
Sodium: 138 mmol/L (ref 134–144)
Total Protein: 6.4 g/dL (ref 6.0–8.5)
eGFR: 112 mL/min/{1.73_m2} (ref >59)

## 2022-07-24 LAB — TSH: TSH: 0.774 u[IU]/mL (ref 0.450–4.500)

## 2022-07-24 LAB — STRATIFY JCV(TM) AB W/INDEX
JCV Antibody: POSITIVE — AB
JCV Index Value: 1.62

## 2022-07-27 ENCOUNTER — Telehealth: Payer: Self-pay

## 2022-07-27 ENCOUNTER — Ambulatory Visit: Payer: 59 | Admitting: *Deleted

## 2022-07-27 ENCOUNTER — Ambulatory Visit (INDEPENDENT_AMBULATORY_CARE_PROVIDER_SITE_OTHER): Payer: 59

## 2022-07-27 ENCOUNTER — Other Ambulatory Visit: Payer: Self-pay | Admitting: Obstetrics and Gynecology

## 2022-07-27 VITALS — BP 121/82 | HR 72 | Ht 64.5 in | Wt 216.1 lb

## 2022-07-27 DIAGNOSIS — Z3480 Encounter for supervision of other normal pregnancy, unspecified trimester: Secondary | ICD-10-CM

## 2022-07-27 DIAGNOSIS — O3680X0 Pregnancy with inconclusive fetal viability, not applicable or unspecified: Secondary | ICD-10-CM | POA: Diagnosis not present

## 2022-07-27 DIAGNOSIS — O021 Missed abortion: Secondary | ICD-10-CM

## 2022-07-27 NOTE — H&P (Signed)
Teresa Arnold is an 46 y.o. female P27 here for scheduled dilatation and evacuation. Patient diagnosed with a missed abortion measuring 9 weeks on 07/27/22 by ultrasound. Patient was counseled on management options and opted for D&E. Patient reports feeling well and denies vaginal bleeding or cramping. Patient is ready to have procedure  Pertinent Gynecological History: Last mammogram:  needs follow up scheduled in March 2024  Date: 06/19/2022 Last pap: normal Date: 02/2022 OB History: G6, P4014   Menstrual History: Patient's last menstrual period was 05/22/2022 (approximate).    Past Medical History:  Diagnosis Date   Bronchitis    Chronic insomnia 05/22/2017   GERD (gastroesophageal reflux disease)    Iron deficiency anemia 05/22/2017   Morbid obesity (HCC) 05/22/2017   MS (multiple sclerosis) (HCC)    Vitamin B12 deficiency 11/2019   Vitamin D deficiency 11/2019    Past Surgical History:  Procedure Laterality Date   CESAREAN SECTION  04/26/1996   x4   CESAREAN SECTION  04/09/2007   CESAREAN SECTION  01/20/2009   CESAREAN SECTION  02/19/2010    Family History  Problem Relation Age of Onset   Stroke Father    Stroke Mother    Hypertension Mother    Hypertension Other     Social History:  reports that she has never smoked. She has never used smokeless tobacco. She reports that she does not currently use alcohol. She reports that she does not use drugs.  Allergies:  Allergies  Allergen Reactions   Other Shortness Of Breath and Itching    Tampons   Gadolinium Derivatives Nausea And Vomiting    Pt only received half dose of 8ml before getting sick.    Sulfa Antibiotics Itching and Rash   Tysabri [Natalizumab] Rash    Medications Prior to Admission  Medication Sig Dispense Refill Last Dose   pantoprazole (PROTONIX) 40 MG tablet TAKE 1 TABLET BY MOUTH EVERY DAY 30 tablet 10 07/30/2022   acetaminophen (TYLENOL) 325 MG tablet Take 650 mg by mouth every 6 (six)  hours as needed for moderate pain.       Prenat w/o A FeCbnFeGlu-FA &B6 (CITRANATAL B-CALM) 20-1 MG & 2 x 25 MG MISC TAKE 1 TABLET BY MOUTH EVERY DAY (Patient not taking: Reported on 07/27/2022) 30 each 11    Prenat-Fe Poly-Methfol-FA-DHA (VITAFOL ULTRA) 29-0.6-0.4-200 MG CAPS Take 1 capsule by mouth daily. (Patient not taking: Reported on 07/27/2022) 30 capsule 11    Siponimod Fumarate (MAYZENT) 2 MG TABS TAKE 1 TABLET (2MG ) BY  MOUTH ONCE DAILY (DISCARD 3 MONTHS AFTER OPENING  BOTTLE) (Patient not taking: Reported on 07/10/2022) 90 tablet 3    valACYclovir (VALTREX) 1000 MG tablet Take 1 tablet (1,000 mg total) by mouth daily. (Patient taking differently: Take 1,000 mg by mouth daily. Takes as needed for outbreak.) 90 tablet 2     Review of Systems See pertinent in HPI. All other systems reviewed and non contributory Blood pressure 122/85, pulse 80, temperature 97.9 F (36.6 C), temperature source Oral, resp. rate 20, height 5' 4.5" (1.638 m), weight 98 kg, last menstrual period 05/22/2022, SpO2 100 %. Physical Exam GENERAL: Well-developed, well-nourished female in no acute distress.  LUNGS: Clear to auscultation bilaterally.  HEART: Regular rate and rhythm. ABDOMEN: Soft, nontender, nondistended. No organomegaly. PELVIC: Deferred to OR EXTREMITIES: No cyanosis, clubbing, or edema, 2+ distal pulses.  Results for orders placed or performed during the hospital encounter of 07/31/22 (from the past 24 hour(s))  Type and screen  Status: None   Collection Time: 07/31/22  7:45 AM  Result Value Ref Range   ABO/RH(D) O POS    Antibody Screen NEG    Sample Expiration      08/03/2022,2359 Performed at [facility], [address] [place], [place] [address]   CBC     Status: Abnormal   Collection Time: 07/31/22  7:53 AM  Result Value Ref Range   WBC 5.3 4.0 - 10.5 K/uL   RBC 3.79 (L) 3.87 - 5.11 MIL/uL   Hemoglobin 10.2 (L) 12.0 - 15.0 g/dL   HCT 79.0 (L) 38.3 - 33.8 %   MCV 87.1  80.0 - 100.0 fL   MCH 26.9 26.0 - 34.0 pg   MCHC 30.9 30.0 - 36.0 g/dL   RDW 32.9 19.1 - 66.0 %   Platelets 311 150 - 400 K/uL   nRBC 0.0 0.0 - 0.2 %    No results found.   Assessment/Plan: 46 yo P4014 with a 9 week missed abortion here for a D&E - Risks benefits and alternatives were explained including but not limited to risks of bleeding, infection, uterine perforation and damage to adjacent organs - patient verbalized understanding and all questions were answered  Sola Margolis 07/31/2022, 9:02 AM

## 2022-07-27 NOTE — Progress Notes (Signed)
OB US for viability and dating performed as documented. Absent FHR noted on TV scan. Dr. Donavan Foil notified and accompanied RN to room. Absent FHR confirmed with TA scan by Dr. Donavan Foil. Dr. Donavan Foil informed pt and husband and options for management given. Emotional support provided.

## 2022-07-27 NOTE — Telephone Encounter (Signed)
Called and spoke with patient, surgery date, time location and pre-op instructions given. Patient expressed understanding

## 2022-07-28 ENCOUNTER — Encounter (HOSPITAL_COMMUNITY): Payer: Self-pay | Admitting: Obstetrics and Gynecology

## 2022-07-28 ENCOUNTER — Other Ambulatory Visit: Payer: Self-pay

## 2022-07-28 NOTE — Progress Notes (Signed)
PCP - Thad Ranger, MD Cardiologist - denies  PPM/ICD - denies  EKG - 03/16/18  CPAP - n/a  Fasting Blood Sugar - n/a  Blood Thinner Instructions: n/a Aspirin Instructions: Patient was instructed: As of today, STOP taking any Aspirin (unless otherwise instructed by your surgeon) Aleve, Naproxen, Ibuprofen, Motrin, Advil, Goody's, BC's, all herbal medications, fish oil, and all vitamins.  ERAS Protcol - n/a  COVID TEST- n/a  Anesthesia review: no  Patient verbally denies any shortness of breath, fever, cough and chest pain during phone call   -------------  SDW INSTRUCTIONS given:  Your procedure is scheduled on Monday, November 20th, 2023.  Report to [facility] Main Entrance "A" at 07:00 A.M., and check in at the Admitting office.  Call this number if you have problems the morning of surgery:  [phone]   Remember:  Do not eat or drink after midnight the night before your surgery    Take these medicines the morning of surgery with A SIP OF WATER: Protonix PRN: Tylenol, Valtrex   The day of surgery:                     Do not wear jewelry, make up, or nail polish            Do not wear lotions, powders, perfumes, or deodorant.            Do not shave 48 hours prior to surgery.              Do not bring valuables to the hospital.            [facility] is not responsible for any belongings or valuables.  Do NOT Smoke (Tobacco/Vaping) 24 hours prior to your procedure If you use a CPAP at night, you may bring all equipment for your overnight stay.   Contacts, glasses, dentures or bridgework may not be worn into surgery.      For patients admitted to the hospital, discharge time will be determined by your treatment team.   Patients discharged the day of surgery will not be allowed to drive home, and someone needs to stay with them for 24 hours.    Special instructions:   [place]- Preparing For Surgery  Before surgery, you can play an important role.  Because skin is not sterile, your skin needs to be as free of germs as possible. You can reduce the number of germs on your skin by washing with CHG (chlorahexidine gluconate) Soap before surgery.  CHG is an antiseptic cleaner which kills germs and bonds with the skin to continue killing germs even after washing.    Oral Hygiene is also important to reduce your risk of infection.  Remember - BRUSH YOUR TEETH THE MORNING OF SURGERY WITH YOUR REGULAR TOOTHPASTE  Please do not use if you have an allergy to CHG or antibacterial soaps. If your skin becomes reddened/irritated stop using the CHG.  Do not shave (including legs and underarms) for at least 48 hours prior to first CHG shower. It is OK to shave your face.  Please follow these instructions carefully.   Shower the NIGHT BEFORE SURGERY and the MORNING OF SURGERY with DIAL Soap.   Pat yourself dry with a CLEAN TOWEL.  Wear CLEAN PAJAMAS to bed the night before surgery  Place CLEAN SHEETS on your bed the night of your first shower and DO NOT SLEEP WITH PETS.   Day of Surgery: Please shower morning of surgery  Wear Clean/Comfortable clothing the morning of surgery Do not apply any deodorants/lotions.   Remember to brush your teeth WITH YOUR REGULAR TOOTHPASTE.   Questions were answered. Patient verbalized understanding of instructions.

## 2022-07-31 ENCOUNTER — Encounter (HOSPITAL_COMMUNITY): Payer: Self-pay | Admitting: Obstetrics and Gynecology

## 2022-07-31 ENCOUNTER — Telehealth: Payer: Self-pay | Admitting: Neurology

## 2022-07-31 ENCOUNTER — Ambulatory Visit (HOSPITAL_BASED_OUTPATIENT_CLINIC_OR_DEPARTMENT_OTHER): Payer: 59 | Admitting: Anesthesiology

## 2022-07-31 ENCOUNTER — Ambulatory Visit (HOSPITAL_COMMUNITY)
Admission: RE | Admit: 2022-07-31 | Discharge: 2022-07-31 | Disposition: A | Discharge status: Home / Self Care | Payer: 59 | Attending: Obstetrics and Gynecology | Admitting: Obstetrics and Gynecology

## 2022-07-31 ENCOUNTER — Ambulatory Visit (HOSPITAL_COMMUNITY): Payer: 59 | Admitting: Anesthesiology

## 2022-07-31 ENCOUNTER — Encounter (HOSPITAL_COMMUNITY)
Admission: RE | Disposition: A | Discharge status: Home / Self Care | Payer: Self-pay | Source: Home / Self Care | Attending: Obstetrics and Gynecology

## 2022-07-31 ENCOUNTER — Other Ambulatory Visit: Payer: Self-pay

## 2022-07-31 DIAGNOSIS — O021 Missed abortion: Secondary | ICD-10-CM | POA: Insufficient documentation

## 2022-07-31 DIAGNOSIS — G35 Multiple sclerosis: Secondary | ICD-10-CM

## 2022-07-31 HISTORY — PX: DILATION AND EVACUATION: SHX1459

## 2022-07-31 LAB — CBC
HCT: 33 % — ABNORMAL LOW (ref 36.0–46.0)
Hemoglobin: 10.2 g/dL — ABNORMAL LOW (ref 12.0–15.0)
MCH: 26.9 pg (ref 26.0–34.0)
MCHC: 30.9 g/dL (ref 30.0–36.0)
MCV: 87.1 fL (ref 80.0–100.0)
Platelets: 311 10*3/uL (ref 150–400)
RBC: 3.79 MIL/uL — ABNORMAL LOW (ref 3.87–5.11)
RDW: 14.1 % (ref 11.5–15.5)
WBC: 5.3 10*3/uL (ref 4.0–10.5)
nRBC: 0 % (ref 0.0–0.2)

## 2022-07-31 LAB — TYPE AND SCREEN
ABO/RH(D): O POS
Antibody Screen: NEGATIVE

## 2022-07-31 SURGERY — DILATION AND EVACUATION, UTERUS
Anesthesia: General

## 2022-07-31 MED ORDER — IBUPROFEN 600 MG PO TABS: 600.0000 mg | ORAL_TABLET | Freq: Four times a day (QID) | ORAL | 3 refills | Status: DC | PRN

## 2022-07-31 MED ORDER — DOXYCYCLINE HYCLATE 100 MG IV SOLR
200.0000 mg | INTRAVENOUS | Status: AC
  Administered 2022-07-31: 200 mg via INTRAVENOUS
  Filled 2022-07-31: qty 200

## 2022-07-31 MED ORDER — MIDAZOLAM HCL 2 MG/2ML IJ SOLN
INTRAMUSCULAR | Status: DC | PRN
  Administered 2022-07-31 (×2): 2 mg via INTRAVENOUS

## 2022-07-31 MED ORDER — CHLOROPROCAINE HCL 1 % IJ SOLN
INTRAMUSCULAR | Status: AC
  Filled 2022-07-31: qty 30

## 2022-07-31 MED ORDER — LACTATED RINGERS IV SOLN: INTRAVENOUS | Status: DC

## 2022-07-31 MED ORDER — PROPOFOL 10 MG/ML IV BOLUS
INTRAVENOUS | Status: DC | PRN
  Administered 2022-07-31: 200 mg via INTRAVENOUS

## 2022-07-31 MED ORDER — MIDAZOLAM HCL 2 MG/2ML IJ SOLN
INTRAMUSCULAR | Status: AC
  Filled 2022-07-31: qty 2

## 2022-07-31 MED ORDER — CHLOROPROCAINE HCL 1 % IJ SOLN
INTRAMUSCULAR | Status: DC | PRN
  Administered 2022-07-31: 10 mL

## 2022-07-31 MED ORDER — LIDOCAINE 2% (20 MG/ML) 5 ML SYRINGE
INTRAMUSCULAR | Status: DC | PRN
  Administered 2022-07-31: 60 mg via INTRAVENOUS

## 2022-07-31 MED ORDER — FENTANYL CITRATE (PF) 250 MCG/5ML IJ SOLN
INTRAMUSCULAR | Status: AC
  Filled 2022-07-31: qty 5

## 2022-07-31 MED ORDER — DEXAMETHASONE SODIUM PHOSPHATE 10 MG/ML IJ SOLN
INTRAMUSCULAR | Status: DC | PRN
  Administered 2022-07-31: 5 mg via INTRAVENOUS

## 2022-07-31 MED ORDER — ONDANSETRON HCL 4 MG/2ML IJ SOLN
INTRAMUSCULAR | Status: AC
  Filled 2022-07-31: qty 2

## 2022-07-31 MED ORDER — ACETAMINOPHEN 10 MG/ML IV SOLN: 1000.0000 mg | Freq: Once | INTRAVENOUS | Status: DC | PRN

## 2022-07-31 MED ORDER — 0.9 % SODIUM CHLORIDE (POUR BTL) OPTIME
TOPICAL | Status: DC | PRN
  Administered 2022-07-31: 1000 mL

## 2022-07-31 MED ORDER — FENTANYL CITRATE (PF) 250 MCG/5ML IJ SOLN
INTRAMUSCULAR | Status: DC | PRN
  Administered 2022-07-31: 50 ug via INTRAVENOUS
  Administered 2022-07-31: 25 ug via INTRAVENOUS

## 2022-07-31 MED ORDER — DEXAMETHASONE SODIUM PHOSPHATE 10 MG/ML IJ SOLN
INTRAMUSCULAR | Status: AC
  Filled 2022-07-31: qty 1

## 2022-07-31 MED ORDER — ONDANSETRON HCL 4 MG/2ML IJ SOLN
INTRAMUSCULAR | Status: DC | PRN
  Administered 2022-07-31: 4 mg via INTRAVENOUS

## 2022-07-31 MED ORDER — OXYCODONE-ACETAMINOPHEN 5-325 MG PO TABS: 1.0000 | ORAL_TABLET | Freq: Four times a day (QID) | ORAL | 0 refills | Status: DC | PRN

## 2022-07-31 MED ORDER — FENTANYL CITRATE (PF) 100 MCG/2ML IJ SOLN: 25.0000 ug | INTRAMUSCULAR | Status: DC | PRN

## 2022-07-31 MED ORDER — POVIDONE-IODINE 10 % EX SWAB: 2.0000 | Freq: Once | CUTANEOUS | Status: DC

## 2022-07-31 MED ORDER — PROPOFOL 500 MG/50ML IV EMUL
INTRAVENOUS | Status: DC | PRN
  Administered 2022-07-31: 150 ug/kg/min via INTRAVENOUS

## 2022-07-31 MED ORDER — CHLORHEXIDINE GLUCONATE 0.12 % MT SOLN
15.0000 mL | OROMUCOSAL | Status: AC
  Administered 2022-07-31: 15 mL via OROMUCOSAL
  Filled 2022-07-31: qty 15

## 2022-07-31 MED ORDER — PROPOFOL 1000 MG/100ML IV EMUL
INTRAVENOUS | Status: AC
  Filled 2022-07-31: qty 100

## 2022-07-31 SURGICAL SUPPLY — 23 items
CATH ROBINSON RED A/P 16FR (CATHETERS) ×1 IMPLANT
FILTER UTR ASPR ASSEMBLY (MISCELLANEOUS) ×1 IMPLANT
GLOVE BIOGEL PI IND STRL 6.5 (GLOVE) ×1 IMPLANT
GLOVE BIOGEL PI IND STRL 7.0 (GLOVE) ×1 IMPLANT
GLOVE SURG SS PI 6.5 STRL IVOR (GLOVE) ×1 IMPLANT
GOWN STRL REUS W/ TWL LRG LVL3 (GOWN DISPOSABLE) ×2 IMPLANT
GOWN STRL REUS W/TWL LRG LVL3 (GOWN DISPOSABLE) ×2
HOSE CONNECTING 18IN BERKELEY (TUBING) ×1 IMPLANT
KIT BERKELEY 1ST TRI 3/8 NO TR (MISCELLANEOUS) ×1 IMPLANT
KIT BERKELEY 1ST TRIMESTER 3/8 (MISCELLANEOUS) ×1 IMPLANT
NS IRRIG 1000ML POUR BTL (IV SOLUTION) ×1 IMPLANT
PACK VAGINAL MINOR WOMEN LF (CUSTOM PROCEDURE TRAY) ×1 IMPLANT
PACK VAGINAL WOMENS (CUSTOM PROCEDURE TRAY) IMPLANT
PAD OB MATERNITY 4.3X12.25 (PERSONAL CARE ITEMS) ×1 IMPLANT
SET BERKELEY SUCTION TUBING (SUCTIONS) ×1 IMPLANT
SPIKE FLUID TRANSFER (MISCELLANEOUS) ×1 IMPLANT
TOWEL GREEN STERILE FF (TOWEL DISPOSABLE) ×2 IMPLANT
UNDERPAD 30X36 HEAVY ABSORB (UNDERPADS AND DIAPERS) ×1 IMPLANT
VACURETTE 10 RIGID CVD (CANNULA) IMPLANT
VACURETTE 6 ASPIR F TIP BERK (CANNULA) IMPLANT
VACURETTE 7MM CVD STRL WRAP (CANNULA) IMPLANT
VACURETTE 8 RIGID CVD (CANNULA) IMPLANT
VACURETTE 9 RIGID CVD (CANNULA) IMPLANT

## 2022-07-31 NOTE — Anesthesia Procedure Notes (Signed)
Procedure Name: LMA Insertion Date/Time: 07/31/2022 9:19 AM  Performed by: Wendi Snipes, CRNAPre-anesthesia Checklist: Patient identified, Emergency Drugs available, Suction available and Patient being monitored Patient Re-evaluated:Patient Re-evaluated prior to induction Oxygen Delivery Method: Circle system utilized Preoxygenation: Pre-oxygenation with 100% oxygen Induction Type: IV induction LMA: LMA inserted LMA Size: 4.0 Placement Confirmation: positive ETCO2 Dental Injury: Teeth and Oropharynx as per pre-operative assessment

## 2022-07-31 NOTE — Op Note (Signed)
Teresa Arnold PROCEDURE DATE: 07/31/2022  PREOPERATIVE DIAGNOSIS: 9 week missed abortion. POSTOPERATIVE DIAGNOSIS: The same. PROCEDURE:     Dilation and Evacuation. SURGEON:  Dr. Catalina Antigua  INDICATIONS: 46 y.o. [id number] with MAB at [redacted] weeks gestation, needing surgical completion.  Risks of surgery were discussed with the patient including but not limited to: bleeding which may require transfusion; infection which may require antibiotics; injury to uterus or surrounding organs;need for additional procedures including laparotomy or laparoscopy; possibility of intrauterine scarring which may impair future fertility; and other postoperative/anesthesia complications. Written informed consent was obtained.    FINDINGS:  A 10-week size anteverted uterus, moderate amounts of products of conception, specimen sent to pathology.  ANESTHESIA:    Monitored intravenous sedation, paracervical block. INTRAVENOUS FLUIDS:  600 ml of LR ESTIMATED BLOOD LOSS:  Less than 20 ml. SPECIMENS:  Products of conception sent to pathology COMPLICATIONS:  None immediate.  PROCEDURE DETAILS:  The patient received intravenous antibiotics while in the preoperative area.  She was then taken to the operating room where general anesthesia was administered and was found to be adequate.  After an adequate timeout was performed, she was placed in the dorsal lithotomy position and examined; then prepped and draped in the sterile manner.   Her bladder was catheterized for an unmeasured amount of clear, yellow urine. A vaginal speculum was then placed in the patient's vagina and a single tooth tenaculum was applied to the anterior lip of the cervix.  A paracervical block using 0.5% Marcaine was administered. The cervix was gently dilated to accommodate a 9 mm suction curette that was gently advanced to the uterine fundus.  The suction device was then activated and curette slowly rotated to clear the uterus of products of  conception.  A sharp curettage was then performed to confirm complete emptying of the uterus. There was minimal bleeding noted and the tenaculum removed with good hemostasis noted.   All instruments were removed from the patient's vagina. The patient tolerated the procedure well and was taken to the recovery area awake, and in stable condition.  The patient will be discharged to home as per PACU criteria.  Routine postoperative instructions given.  She was prescribed Percocet and Ibuprofen.  She will follow up in the clinic in 2 weeks for postoperative evaluation.

## 2022-07-31 NOTE — Anesthesia Postprocedure Evaluation (Signed)
Anesthesia Post Note  Patient: Teresa Arnold  Procedure(s) Performed: DILATATION AND EVACUATION     Patient location during evaluation: PACU Anesthesia Type: General Level of consciousness: awake and alert Pain management: pain level controlled Vital Signs Assessment: post-procedure vital signs reviewed and stable Respiratory status: spontaneous breathing, nonlabored ventilation, respiratory function stable and patient connected to nasal cannula oxygen Cardiovascular status: blood pressure returned to baseline and stable Postop Assessment: no apparent nausea or vomiting Anesthetic complications: no   No notable events documented.  Last Vitals:  Vitals:   07/31/22 1115 07/31/22 1130  BP: 139/80 128/83  Pulse: 62 62  Resp:    Temp:  36.6 C  SpO2: 99% 100%    Last Pain:  Vitals:   07/31/22 1045  TempSrc:   PainSc: 0-No pain                 Earl Lites P Amilee Janvier

## 2022-07-31 NOTE — Telephone Encounter (Signed)
Dr. Terrace Arabia is out of the office today, I will discuss with her tomorrow.

## 2022-07-31 NOTE — Telephone Encounter (Signed)
Pt is asking for a call to discuss starting back on Mayzent

## 2022-07-31 NOTE — Anesthesia Preprocedure Evaluation (Addendum)
Anesthesia Evaluation  Patient identified by MRN, date of birth, ID band Patient awake    Reviewed: Allergy & Precautions, NPO status , Patient's Chart, lab work & pertinent test results  Airway Mallampati: II  TM Distance: >3 FB Neck ROM: Full    Dental no notable dental hx.    Pulmonary neg pulmonary ROS   Pulmonary exam normal        Cardiovascular negative cardio ROS  Rhythm:Regular Rate:Normal     Neuro/Psych  Headaches MS  Neuromuscular disease  negative psych ROS   GI/Hepatic Neg liver ROS,GERD  Medicated,,  Endo/Other  negative endocrine ROS    Renal/GU negative Renal ROS  negative genitourinary   Musculoskeletal negative musculoskeletal ROS (+)    Abdominal Normal abdominal exam  (+)   Peds  Hematology  (+) Blood dyscrasia, anemia   Anesthesia Other Findings   Reproductive/Obstetrics Missed Ab                              Anesthesia Physical Anesthesia Plan  ASA: 2  Anesthesia Plan: General   Post-op Pain Management:    Induction: Intravenous  PONV Risk Score and Plan: 3 and Ondansetron, Dexamethasone, Midazolam and Treatment may vary due to age or medical condition  Airway Management Planned: Mask and LMA  Additional Equipment: None  Intra-op Plan:   Post-operative Plan: Extubation in OR  Informed Consent: I have reviewed the patients History and Physical, chart, labs and discussed the procedure including the risks, benefits and alternatives for the proposed anesthesia with the patient or authorized representative who has indicated his/her understanding and acceptance.     Dental advisory given  Plan Discussed with: CRNA  Anesthesia Plan Comments: (Lab Results      Component                Value               Date                      WBC                      5.3                 07/31/2022                HGB                      10.2 (L)             07/31/2022                HCT                      33.0 (L)            07/31/2022                MCV                      87.1                07/31/2022                PLT                      311  07/31/2022            Lab Results      Component                Value               Date                      NA                       138                 07/10/2022                K                        4.4                 07/10/2022                CO2                      23                  07/10/2022                GLUCOSE                  82                  07/10/2022                BUN                      13                  07/10/2022                CREATININE               0.64                07/10/2022                CALCIUM                  8.9                 07/10/2022                EGFR                     112                 07/10/2022                GFRNONAA                 110                 06/08/2020           )         Anesthesia Quick Evaluation

## 2022-07-31 NOTE — Telephone Encounter (Signed)
07/31/2022 will hold for Dr. Terrace Arabia

## 2022-07-31 NOTE — Transfer of Care (Signed)
Immediate Anesthesia Transfer of Care Note  Patient: Teresa Arnold  Procedure(s) Performed: DILATATION AND EVACUATION  Patient Location: PACU  Anesthesia Type:General  Level of Consciousness: awake and patient cooperative  Airway & Oxygen Therapy: Patient Spontanous Breathing and Patient connected to face mask oxygen  Post-op Assessment: Report given to RN, Post -op Vital signs reviewed and stable, and Patient moving all extremities  Post vital signs: Reviewed and stable  Last Vitals:  Vitals Value Taken Time  BP 125/81 07/31/22 0948  Temp    Pulse 91 07/31/22 0950  Resp 19 07/31/22 0950  SpO2 100 % 07/31/22 0950  Vitals shown include unvalidated device data.  Last Pain:  Vitals:   07/31/22 0724  TempSrc: Oral  PainSc: 0-No pain      Patients Stated Pain Goal: 0 (07/31/22 0724)  Complications: No notable events documented.

## 2022-08-01 ENCOUNTER — Encounter (HOSPITAL_COMMUNITY): Payer: Self-pay | Admitting: Obstetrics and Gynecology

## 2022-08-01 LAB — SURGICAL PATHOLOGY

## 2022-08-01 NOTE — Telephone Encounter (Signed)
"   Prior to first dose: Test for CYP2C9 variants to determine CYP2C9 genotype. Review results of a recent CBC and recent (within the last 6 months) transaminase and bilirubin levels, and test for antibodies to varicella zoster virus. Obtain an ophthalmic evaluation of the fundus, including the macula, and a baseline skin examination (before or shortly after initiation of therapy). Obtain ECG for preexisting conduction abnormalities and evaluate concomitant drugs that slow heart rate or atrioventricular conduction.    CYP2C9 genotypes *1/*1, *1/*2, or *2/*2, titrate over 5 days (use 12-tablet starter pack): Days 1 and 2, 0.25 mg/day orally; day 3, 0.5 mg/day; day 4, 0.75 mg/day; day 5, 1.25 mg/day; maintenance dosage beginning on day 6 and thereafter, 2 mg orally once daily [2] CYP2C9 genotypes *1/*3 or *2/*3, titrate over 4 days (use 7-tablet starter pack): Days 1 and 2, 0.25 mg/day orally; day 3, 0.5 mg/day; day 4, 0.75 mg/day; maintenance dosage beginning on day 5 and thereafter, 1 mg orally once daily [2]"  July 2019 phone note Received CYP2C9 genotype lab result back from [facility] via fax.  Result: *1/*1. The standard maintenance dose (2mg  once daily) is recommended. I spoke with Dr. . He reviewed and cleared pt to start Mayzent therapy.        Component Ref Range & Units 4 yr ago  Varicella zoster IgG Immune >165 index      Please call patient, make sure she had a recent eye examination at least within 6 months, EKG within 4 weeks, that did not show significant abnormalities,  Then she should start titration over 5 days, 12 tablets starter pack, day 1 to 0.25 mg daily, 33 0.5 mg daily, day four 0.75 mg daily, day five 1.25 mg daily  Maintenance dose 2 mg daily  Return To Clinic With NP In 3 Months

## 2022-08-01 NOTE — Telephone Encounter (Signed)
Hold off Mayzent now due to pregnancy

## 2022-08-01 NOTE — Telephone Encounter (Signed)
   You  Levert Feinstein, MD4 hours ago (10:30 AM)    Please review chart, pt miscarried recent pregnancy. She stopped Mazent on 07/09/2022 per recent office note. Can we initiate med re start or would you like pt to come in for visit/labs?

## 2022-08-01 NOTE — Telephone Encounter (Signed)
Mayzent Patient Support Program [facility]) Need a new start form for Mayzent to restart medication. If you have questions can call [phone].

## 2022-08-01 NOTE — Addendum Note (Signed)
Addended by: Levert Feinstein on: 08/01/2022 04:54 PM   Modules accepted: Orders

## 2022-08-02 ENCOUNTER — Telehealth: Payer: Self-pay | Admitting: Neurology

## 2022-08-02 NOTE — Telephone Encounter (Signed)
I called pt back. She reports last eye exam 02/14/2022 @ [facility].   She is still waiting on PCP to call back about EKG.  Will touch base next week, I have requested eye exam to be sent to our clinic.

## 2022-08-02 NOTE — Telephone Encounter (Signed)
Pt is asking for a call back from Megan B, RN 

## 2022-08-02 NOTE — Telephone Encounter (Signed)
I called pt and we discussed message.   She reports she did have eye exam this year, not certain on the date.   She will call her eye doctor this morning along with PCP to have EKG ordered.   She will call me back with updates.

## 2022-08-02 NOTE — Telephone Encounter (Signed)
Referral sent to [facility], phone # [phone]. 

## 2022-08-07 NOTE — Telephone Encounter (Signed)
Received eye exam report on 08/02/2022 from [facility].   I called pt to check in today and she is scheduled for EKG with PCP on 08/10/2022 at 230. Oncethis is obtained. We can proceed with re starting Mayzent if EKG within normal limits.

## 2022-08-10 ENCOUNTER — Telehealth: Payer: Self-pay | Admitting: Emergency Medicine

## 2022-08-10 ENCOUNTER — Ambulatory Visit: Payer: 59 | Attending: Internal Medicine | Admitting: Internal Medicine

## 2022-08-10 ENCOUNTER — Encounter: Payer: Self-pay | Admitting: Internal Medicine

## 2022-08-10 VITALS — BP 120/72 | HR 71 | Ht 64.0 in | Wt 218.6 lb

## 2022-08-10 DIAGNOSIS — Z7689 Persons encountering health services in other specified circumstances: Secondary | ICD-10-CM

## 2022-08-10 DIAGNOSIS — Z7189 Other specified counseling: Secondary | ICD-10-CM | POA: Diagnosis not present

## 2022-08-10 NOTE — Telephone Encounter (Signed)
Return call to patient who desires to discuss results. Some results are not yet released, pt encouraged to follow up.

## 2022-08-10 NOTE — Patient Instructions (Signed)
Medication Instructions:  NO CHANGES  *If you need a refill on your cardiac medications before your next appointment, please call your pharmacy*    Follow-Up: At [facility], you and your health needs are our priority.  As part of our continuing mission to provide you with exceptional heart care, we have created designated Provider Care Teams.  These Care Teams include your primary Cardiologist (physician) and Advanced Practice Providers (APPs -  Physician Assistants and Nurse Practitioners) who all work together to provide you with the care you need, when you need it.  We recommend signing up for the patient portal called "MyChart".  Sign up information is provided on this After Visit Summary.  MyChart is used to connect with patients for Virtual Visits (Telemedicine).  Patients are able to view lab/test results, encounter notes, upcoming appointments, etc.  Non-urgent messages can be sent to your provider as well.   To learn more about what you can do with MyChart, go to [email or web address].    Your next appointment:    AS NEEDED with Dr. Hilty  

## 2022-08-10 NOTE — Progress Notes (Signed)
OFFICE CONSULT NOTE  Chief Complaint:  Clearance to start medication  Primary Care Physician: Pcp, No  HPI:  Teresa Arnold is a 46 y.o. female who is being seen today for the evaluation of cardiac clearance at the request of Levert Feinstein, MD. This is a pleasant 46 year old female with no cardiac history who has unfortunately been diagnosed with MS (relapsing/remitting) and had been recently placed on Mayzent.  This therapy has a long list of cardiovascular contraindications and requires an EKG baseline before starting it because it can cause QT prolongation.  It seems also to be highly dependent on the CYPC9 genotype.  She unfortunately had to stop taking the medication because she became pregnant however unfortunately lost the baby.  She is now planning to start back on therapy and needs a new EKG.  She does report that there is history of stroke and hypertension in her mother who was in her 86s.  She denies any chest pain or shortness of breath with exertion.  PMHx:  Past Medical History:  Diagnosis Date   Bronchitis    Chronic insomnia 05/22/2017   GERD (gastroesophageal reflux disease)    Iron deficiency anemia 05/22/2017   Morbid obesity (HCC) 05/22/2017   MS (multiple sclerosis) (HCC)    Vitamin B12 deficiency 11/2019   Vitamin D deficiency 11/2019    Past Surgical History:  Procedure Laterality Date   CESAREAN SECTION  04/26/1996   x4   CESAREAN SECTION  04/09/2007   CESAREAN SECTION  01/20/2009   CESAREAN SECTION  02/19/2010   DILATION AND EVACUATION N/A 07/31/2022   Procedure: DILATATION AND EVACUATION;  Surgeon: Catalina Antigua, MD;  Location: [facility] OR;  Service: Gynecology;  Laterality: N/A;    FAMHx:  Family History  Problem Relation Age of Onset   Stroke Father    Stroke Mother    Hypertension Mother    Hypertension Other     SOCHx:   reports that she has never smoked. She has never used smokeless tobacco. She reports that she does not currently use  alcohol. She reports that she does not use drugs.  ALLERGIES:  Allergies  Allergen Reactions   Other Shortness Of Breath and Itching    Tampons   Gadolinium Derivatives Nausea And Vomiting    Pt only received half dose of 72ml before getting sick.    Sulfa Antibiotics Itching and Rash   Tysabri [Natalizumab] Rash    ROS: Pertinent items noted in HPI and remainder of comprehensive ROS otherwise negative.  HOME MEDS: Current Outpatient Medications on File Prior to Visit  Medication Sig Dispense Refill   acetaminophen (TYLENOL) 325 MG tablet Take 650 mg by mouth every 6 (six) hours as needed for moderate pain.      ibuprofen (ADVIL) 600 MG tablet Take 1 tablet (600 mg total) by mouth every 6 (six) hours as needed. 60 tablet 3   oxyCODONE-acetaminophen (PERCOCET/ROXICET) 5-325 MG tablet Take 1 tablet by mouth every 6 (six) hours as needed. 15 tablet 0   pantoprazole (PROTONIX) 40 MG tablet TAKE 1 TABLET BY MOUTH EVERY DAY 30 tablet 10   Siponimod Fumarate (MAYZENT) 2 MG TABS TAKE 1 TABLET (2MG ) BY  MOUTH ONCE DAILY (DISCARD 3 MONTHS AFTER OPENING  BOTTLE) 90 tablet 3   valACYclovir (VALTREX) 1000 MG tablet Take 1 tablet (1,000 mg total) by mouth daily. (Patient taking differently: Take 1,000 mg by mouth daily. Takes as needed for outbreak.) 90 tablet 2   Prenat w/o A FeCbnFeGlu-FA &  B6 (CITRANATAL B-CALM) 20-1 MG & 2 x 25 MG MISC TAKE 1 TABLET BY MOUTH EVERY DAY (Patient not taking: Reported on 07/27/2022) 30 each 11   Prenat-Fe Poly-Methfol-FA-DHA (VITAFOL ULTRA) 29-0.6-0.4-200 MG CAPS Take 1 capsule by mouth daily. (Patient not taking: Reported on 07/27/2022) 30 capsule 11   No current facility-administered medications on file prior to visit.    LABS/IMAGING: No results found for this or any previous visit (from the past 48 hour(s)). No results found.  LIPID PANEL:    Component Value Date/Time   CHOL 159 11/26/2020 1056   TRIG 44 11/26/2020 1056   HDL 65 11/26/2020 1056    CHOLHDL 2.4 11/26/2020 1056   LDLCALC 84 11/26/2020 1056    WEIGHTS: Wt Readings from Last 3 Encounters:  08/10/22 218 lb 9.6 oz (99.2 kg)  07/31/22 216 lb 1.6 oz (98 kg)  07/27/22 216 lb 1.6 oz (98 kg)    VITALS: BP 120/72   Pulse 71   Ht 5\' 4"  (1.626 m)   Wt 218 lb 9.6 oz (99.2 kg)   LMP 05/22/2022 (Approximate)   BMI 37.52 kg/m   EXAM: General appearance: alert and no distress Neck: no carotid bruit, no JVD, and thyroid not enlarged, symmetric, no tenderness/mass/nodules Lungs: clear to auscultation bilaterally Heart: regular rate and rhythm, S1, S2 normal, no murmur, click, rub or gallop Abdomen: soft, non-tender; bowel sounds normal; no masses,  no organomegaly Extremities: extremities normal, atraumatic, no cyanosis or edema Pulses: 2+ and symmetric Skin: Skin color, texture, turgor normal. No rashes or lesions Neurologic: Grossly normal Psych: Pleasant  EKG: Normal sinus rhythm at 71- personally reviewed  ASSESSMENT: Normal EKG-okay to start on Mayzent therapy.  PLAN: 1.   EKG performed today appears normal with normal axes and intervals.  No concerning cardiovascular symptoms.  Blood pressure was well-controlled.  From a cardiac standpoint could proceed with therapy on Mayzent as managed by her neurologist.  Follow-up with 07/22/2022 as needed.  [place], MD, [facility], FACP  [place]  [facility]  Medical Director of the [facility]  [facility] Diplomate of the American Board of Clinical Lipidology Attending Cardiologist  Direct Dial: [phone]  Fax: [phone]  Website:  [phone] Don Tiu 08/10/2022, 2:59 PM

## 2022-08-11 ENCOUNTER — Encounter: Payer: 59 | Admitting: Certified Nurse Midwife

## 2022-08-14 NOTE — Telephone Encounter (Signed)
We have received the EKG and eye exam report. Both were normal and ok to move forward with mayzent restart.  I have faxed the documentation/start form to mayzent intake team, received confirmation.  Updated pt via my chart as well.

## 2022-08-17 ENCOUNTER — Ambulatory Visit (INDEPENDENT_AMBULATORY_CARE_PROVIDER_SITE_OTHER): Payer: 59 | Admitting: Obstetrics and Gynecology

## 2022-08-17 ENCOUNTER — Encounter: Payer: Self-pay | Admitting: Obstetrics and Gynecology

## 2022-08-17 ENCOUNTER — Telehealth: Payer: Self-pay | Admitting: Neurology

## 2022-08-17 VITALS — BP 111/79 | HR 67 | Wt 214.0 lb

## 2022-08-17 DIAGNOSIS — Z9889 Other specified postprocedural states: Secondary | ICD-10-CM

## 2022-08-17 NOTE — Progress Notes (Signed)
Pt states she is doing well post operative.  Is she safe to resume sexual activity.

## 2022-08-17 NOTE — Telephone Encounter (Signed)
Baird Lyons is calling from [employer]. Asking that a nurse give pt a call to clarify prescription.

## 2022-08-17 NOTE — Progress Notes (Signed)
46 yo [id number] here for post op check following D&E for 9 week missed abortion. Patient reports feeling well since her procedure on 07/31/2022. Vaginal bleeding has resolved. She denies pelvic pain, fever or abnormal discharge  Past Medical History:  Diagnosis Date   Bronchitis    Chronic insomnia 05/22/2017   GERD (gastroesophageal reflux disease)    Iron deficiency anemia 05/22/2017   Morbid obesity (HCC) 05/22/2017   MS (multiple sclerosis) (HCC)    Vitamin B12 deficiency 11/2019   Vitamin D deficiency 11/2019   Past Surgical History:  Procedure Laterality Date   CESAREAN SECTION  04/26/1996   x4   CESAREAN SECTION  04/09/2007   CESAREAN SECTION  01/20/2009   CESAREAN SECTION  02/19/2010   DILATION AND EVACUATION N/A 07/31/2022   Procedure: DILATATION AND EVACUATION;  Surgeon: Catalina Antigua, MD;  Location: [facility] OR;  Service: Gynecology;  Laterality: N/A;   Family History  Problem Relation Age of Onset   Stroke Father    Stroke Mother    Hypertension Mother    Hypertension Other    Social History   Tobacco Use   Smoking status: Never   Smokeless tobacco: Never  Vaping Use   Vaping Use: Never used  Substance Use Topics   Alcohol use: Not Currently    Comment: not in 3 years   Drug use: No   ROS See pertinent in HPI. All other systems reviewed and non contributory Blood pressure 111/79, pulse 67, weight 214 lb (97.1 kg), last menstrual period 05/22/2022.  GENERAL: Well-developed, well-nourished female in no acute distress.  ABDOMEN: Soft, nontender, nondistended. No organomegaly. NEURO: alert and oriented x 3  A/P 46 yo here for post op check following D&E for missed Ab on 07/31/22 - Pathology results reviewed with the patient - Contraception options reviewed with the patient. Patient opted natural family planning - Patient current on pap smear and mammogram - RTC prn

## 2022-08-17 NOTE — Telephone Encounter (Signed)
I called [facility] back and spoke with Ivin Booty, Verbal order given for mayzent starter pack and maintenance dosage. He verbalized understanding and will begin processing the medication.

## 2022-08-18 ENCOUNTER — Encounter: Payer: Self-pay | Admitting: Nurse Practitioner

## 2022-08-18 ENCOUNTER — Ambulatory Visit (INDEPENDENT_AMBULATORY_CARE_PROVIDER_SITE_OTHER): Payer: 59 | Admitting: Nurse Practitioner

## 2022-08-18 VITALS — BP 112/62 | HR 69 | Temp 97.3°F | Ht 64.0 in | Wt 217.6 lb

## 2022-08-18 DIAGNOSIS — I951 Orthostatic hypotension: Secondary | ICD-10-CM | POA: Diagnosis not present

## 2022-08-18 DIAGNOSIS — D508 Other iron deficiency anemias: Secondary | ICD-10-CM

## 2022-08-18 DIAGNOSIS — E559 Vitamin D deficiency, unspecified: Secondary | ICD-10-CM

## 2022-08-18 DIAGNOSIS — G35 Multiple sclerosis: Secondary | ICD-10-CM

## 2022-08-18 DIAGNOSIS — Z6841 Body Mass Index (BMI) 40.0 and over, adult: Secondary | ICD-10-CM

## 2022-08-18 DIAGNOSIS — K219 Gastro-esophageal reflux disease without esophagitis: Secondary | ICD-10-CM

## 2022-08-18 NOTE — Assessment & Plan Note (Signed)
Last vitamin D Lab Results  Component Value Date   VD25OH 17.2 (L) 12/21/2021  Checking vitamin D levels today Currently not on vitamin D supplement

## 2022-08-18 NOTE — Assessment & Plan Note (Addendum)
Last Weight  Most recent update: 08/18/2022 10:56 AM    Weight  98.7 kg (217 lb 9.6 oz)             Wt Readings from Last 3 Encounters:  08/18/22 217 lb 9.6 oz (98.7 kg)  08/17/22 214 lb (97.1 kg)  08/10/22 218 lb 9.6 oz (99.2 kg)  Need to increase intake of whole food consisting mainly vegetables and protein less carbohydrate, drinking at least 64 ounces of water daily, engaging in regular moderate exercises at least 150 minutes weekly as tolerated encouraged. Patient plans on getting a treadmill that she can be using for exercises at home.

## 2022-08-18 NOTE — Assessment & Plan Note (Signed)
Chronic condition well controlled. On pantoprazole 40 mg daily

## 2022-08-18 NOTE — Progress Notes (Signed)
New Patient Office Visit  Subjective:  Patient ID: Teresa Arnold, female    DOB: February 11, 1976  Age: 46 y.o. MRN: [id number]  CC:  Chief Complaint  Patient presents with   Establish Care    HPI Teresa Arnold is a 46 y.o. female with past medical history of MS, vitamin D deficiency, iron deficiency anemia, class III severe obesity, acid reflux presenting to establish care with new provider.  Patient recently had a D&E  for missed abortion on 07/31/2022.  Patient denies abdominal pain, vaginal bleeding, fever, chills.  She has 4 children, states that she is coping well.   Dizziness . patient complains of feeling dizzy when she bends her head over or when trying to lay flat.  This has been ongoing for years.  She denies tinnitus, chest pain, shortness of breath, numbness, tingling, sensation of roof spinning.    MS. she is followed by neurology for MS. plans restarting her Mayzent which she stopped due to been pregnant, has follow-up appointment with neurology in January.    Obesity. States that she has not been exercising much due to her  MS but she fasts every other day.  She plans on getting a treadmill for exercise at home.   Iron deficiency anemia.  Chronic condition .she is currently not taking iron supplements, she denies heavy menstrual period, sometimes feels fatigued.   Due for flu vaccine, Tdap vaccine need for both vaccines discussed patient declined vaccines today.   Past Medical History:  Diagnosis Date   Bronchitis    Chronic insomnia 05/22/2017   GERD (gastroesophageal reflux disease)    Iron deficiency anemia 05/22/2017   Morbid obesity (HCC) 05/22/2017   MS (multiple sclerosis) (HCC)    Vitamin B12 deficiency 11/2019   Vitamin D deficiency 11/2019    Past Surgical History:  Procedure Laterality Date   CESAREAN SECTION  04/26/1996   x4   CESAREAN SECTION  04/09/2007   CESAREAN SECTION  01/20/2009   CESAREAN SECTION  02/19/2010   DILATION AND EVACUATION  N/A 07/31/2022   Procedure: DILATATION AND EVACUATION;  Surgeon: Catalina Antigua, MD;  Location: [facility] OR;  Service: Gynecology;  Laterality: N/A;    Family History  Problem Relation Age of Onset   Stroke Father    Stroke Mother    Hypertension Mother    Hypertension Other     Social History   Socioeconomic History   Marital status: Single    Spouse name: Not on file   Number of children: 4   Years of education: 14   Highest education level: Not on file  Occupational History   Not on file  Tobacco Use   Smoking status: Never   Smokeless tobacco: Never  Vaping Use   Vaping Use: Never used  Substance and Sexual Activity   Alcohol use: Not Currently    Comment: not in 3 years   Drug use: No   Sexual activity: Yes    Partners: Male    Birth control/protection: None  Other Topics Concern   Not on file  Social History Narrative   Patient is single and lives with her family.   Patient has four children.   Patient has a college education.   Patient is right-handed.   Patient does not drink any caffeine.   Social Determinants of Health   Financial Resource Strain: Medium Risk (12/01/2021)   Overall Financial Resource Strain (CARDIA)    Difficulty of Paying Living Expenses: Somewhat hard  Food Insecurity:  Food Insecurity Present (12/01/2021)   Hunger Vital Sign    Worried About Running Out of Food in the Last Year: Sometimes true    Ran Out of Food in the Last Year: Sometimes true  Transportation Needs: No Transportation Needs (12/01/2021)   PRAPARE - Administrator, Civil Service (Medical): No    Lack of Transportation (Non-Medical): No  Physical Activity: Insufficiently Active (12/01/2021)   Exercise Vital Sign    Days of Exercise per Week: 1 day    Minutes of Exercise per Session: 20 min  Stress: No Stress Concern Present (12/01/2021)   [employer] of Occupational Health - Occupational Stress Questionnaire    Feeling of Stress : Not at all  Social  Connections: Socially Isolated (12/01/2021)   Social Connection and Isolation Panel [NHANES]    Frequency of Communication with Friends and Family: Once a week    Frequency of Social Gatherings with Friends and Family: Once a week    Attends Religious Services: 1 to 4 times per year    Active Member of [employer] or Organizations: No    Attends Banker Meetings: Never    Marital Status: Never married  Intimate Partner Violence: Not At Risk (12/01/2021)   Humiliation, Afraid, Rape, and Kick questionnaire    Fear of Current or Ex-Partner: No    Emotionally Abused: No    Physically Abused: No    Sexually Abused: No    ROS Review of Systems  Constitutional:  Positive for fatigue. Negative for activity change, appetite change, chills and diaphoresis.  Respiratory: Negative.  Negative for apnea, cough, choking and shortness of breath.   Cardiovascular: Negative.  Negative for chest pain, palpitations and leg swelling.  Genitourinary:  Negative for difficulty urinating, flank pain, frequency and menstrual problem.  Musculoskeletal: Negative.   Neurological:  Positive for dizziness. Negative for seizures, facial asymmetry, speech difficulty, light-headedness, numbness and headaches.  Psychiatric/Behavioral: Negative.  Negative for agitation, behavioral problems, confusion, decreased concentration and dysphoric mood.     Objective:   Today's Vitals: BP 112/62   Pulse 69   Temp (!) 97.3 F (36.3 C)   Ht 5\' 4"  (1.626 m)   Wt 217 lb 9.6 oz (98.7 kg)   LMP 05/22/2022 (Approximate)   SpO2 100%   Breastfeeding Unknown   BMI 37.35 kg/m   Physical Exam Constitutional:      General: She is not in acute distress.    Appearance: She is obese. She is not ill-appearing, toxic-appearing or diaphoretic.  Eyes:     General: No scleral icterus.       Right eye: No discharge.        Left eye: No discharge.     Extraocular Movements: Extraocular movements intact.     Conjunctiva/sclera:  Conjunctivae normal.  Cardiovascular:     Rate and Rhythm: Normal rate and regular rhythm.     Pulses: Normal pulses.     Heart sounds: Normal heart sounds. No murmur heard.    No friction rub. No gallop.  Pulmonary:     Effort: Pulmonary effort is normal. No respiratory distress.     Breath sounds: Normal breath sounds. No stridor. No wheezing, rhonchi or rales.  Chest:     Chest wall: No tenderness.  Abdominal:     General: There is no distension.     Palpations: Abdomen is soft.     Tenderness: There is no abdominal tenderness.  Musculoskeletal:        General:  No swelling or tenderness. Normal range of motion.     Right lower leg: No edema.     Left lower leg: No edema.  Skin:    General: Skin is warm and dry.     Capillary Refill: Capillary refill takes less than 2 seconds.  Neurological:     Mental Status: She is alert and oriented to person, place, and time.     Cranial Nerves: No cranial nerve deficit.     Motor: No weakness.     Gait: Gait normal.  Psychiatric:        Mood and Affect: Mood normal.        Behavior: Behavior normal.        Thought Content: Thought content normal.        Judgment: Judgment normal.     Assessment & Plan:   Problem List Items Addressed This Visit       Cardiovascular and Mediastinum   Orthostatic hypotension    Wt Readings from Last 3 Encounters:  08/18/22 217 lb 9.6 oz (98.7 kg)  08/17/22 214 lb (97.1 kg)  08/10/22 218 lb 9.6 oz (99.2 kg)   Temp Readings from Last 3 Encounters:  08/18/22 (!) 97.3 F (36.3 C)  07/31/22 97.8 F (36.6 C)  11/28/21 (!) 97.5 F (36.4 C)   BP Readings from Last 3 Encounters:  08/18/22 112/62  08/17/22 111/79  08/10/22 120/72   Pulse Readings from Last 3 Encounters:  08/18/22 69  08/17/22 67  08/10/22 71  Orthostatic vitals  Laying  124/74 heart rate 74 Sitting 115/66 heart rate 66 Standing 103/66 heart rate 63.  Patient encouraged to avoid changing position abruptly and  get up  slowly from sitting to standing position to minimize dizziness.  She was encouraged to drink at least 64 ounces of water daily to maintain hydration.             Digestive   Acid reflux    Chronic condition well controlled. On pantoprazole 40 mg daily         Nervous and Auditory   Multiple sclerosis (HCC)    Followed by neurology Patient was encouraged to maintain close follow up with them  Plans on restarting Mayzent 2mg  daily         Other   Iron deficiency anemia    Last CBC Lab Results  Component Value Date   WBC 5.3 07/31/2022   HGB 10.2 (L) 07/31/2022   HCT 33.0 (L) 07/31/2022   MCV 87.1 07/31/2022   MCH 26.9 07/31/2022   RDW 14.1 07/31/2022   PLT 311 07/31/2022  Checking iron panel today Will start patient on iron supplement once lab returns if needed      Relevant Orders   Iron, TIBC and Ferritin Panel   Class 3 severe obesity due to excess calories with serious comorbidity and body mass index (BMI) of 40.0 to 44.9 in adult [facility])    Last Weight  Most recent update: 08/18/2022 10:56 AM    Weight  98.7 kg (217 lb 9.6 oz)             Wt Readings from Last 3 Encounters:  08/18/22 217 lb 9.6 oz (98.7 kg)  08/17/22 214 lb (97.1 kg)  08/10/22 218 lb 9.6 oz (99.2 kg)  Need to increase intake of whole food consisting mainly vegetables and protein less carbohydrate, drinking at least 64 ounces of water daily, engaging in regular moderate exercises at least 150 minutes weekly as tolerated  encouraged. Patient plans on getting a treadmill that she can be using for exercises at home.       Vitamin D deficiency - Primary    Last vitamin D Lab Results  Component Value Date   VD25OH 17.2 (L) 12/21/2021  Checking vitamin D levels today Currently not on vitamin D supplement      Relevant Orders   Vitamin D, 25-hydroxy    Outpatient Encounter Medications as of 08/18/2022  Medication Sig   acetaminophen (TYLENOL) 325 MG tablet Take 650 mg by mouth every 6  (six) hours as needed for moderate pain.    ibuprofen (ADVIL) 600 MG tablet Take 1 tablet (600 mg total) by mouth every 6 (six) hours as needed.   oxyCODONE-acetaminophen (PERCOCET/ROXICET) 5-325 MG tablet Take 1 tablet by mouth every 6 (six) hours as needed.   pantoprazole (PROTONIX) 40 MG tablet TAKE 1 TABLET BY MOUTH EVERY DAY   Siponimod Fumarate (MAYZENT) 2 MG TABS TAKE 1 TABLET (2MG ) BY  MOUTH ONCE DAILY (DISCARD 3 MONTHS AFTER OPENING  BOTTLE)   valACYclovir (VALTREX) 1000 MG tablet Take 1 tablet (1,000 mg total) by mouth daily. (Patient taking differently: Take 1,000 mg by mouth daily. Takes as needed for outbreak.)   [DISCONTINUED] Prenat w/o A FeCbnFeGlu-FA &B6 (CITRANATAL B-CALM) 20-1 MG & 2 x 25 MG MISC TAKE 1 TABLET BY MOUTH EVERY DAY (Patient not taking: Reported on 07/27/2022)   [DISCONTINUED] Prenat-Fe Poly-Methfol-FA-DHA (VITAFOL ULTRA) 29-0.6-0.4-200 MG CAPS Take 1 capsule by mouth daily. (Patient not taking: Reported on 07/27/2022)   No facility-administered encounter medications on file as of 08/18/2022.    Follow-up: Return in about 6 months (around 02/17/2023) for Anemia,vit d deff. Donell Beers, FNP

## 2022-08-18 NOTE — Assessment & Plan Note (Signed)
Wt Readings from Last 3 Encounters:  08/18/22 217 lb 9.6 oz (98.7 kg)  08/17/22 214 lb (97.1 kg)  08/10/22 218 lb 9.6 oz (99.2 kg)   Temp Readings from Last 3 Encounters:  08/18/22 (!) 97.3 F (36.3 C)  07/31/22 97.8 F (36.6 C)  11/28/21 (!) 97.5 F (36.4 C)   BP Readings from Last 3 Encounters:  08/18/22 112/62  08/17/22 111/79  08/10/22 120/72   Pulse Readings from Last 3 Encounters:  08/18/22 69  08/17/22 67  08/10/22 71  Orthostatic vitals  Laying  124/74 heart rate 74 Sitting 115/66 heart rate 66 Standing 103/66 heart rate 63.  Patient encouraged to avoid changing position abruptly and  get up slowly from sitting to standing position to minimize dizziness.  She was encouraged to drink at least 64 ounces of water daily to maintain hydration.

## 2022-08-18 NOTE — Patient Instructions (Signed)

## 2022-08-18 NOTE — Assessment & Plan Note (Signed)
Followed by neurology Patient was encouraged to maintain close follow up with them  Plans on restarting Mayzent 2mg  daily

## 2022-08-18 NOTE — Assessment & Plan Note (Addendum)
Last CBC Lab Results  Component Value Date   WBC 5.3 07/31/2022   HGB 10.2 (L) 07/31/2022   HCT 33.0 (L) 07/31/2022   MCV 87.1 07/31/2022   MCH 26.9 07/31/2022   RDW 14.1 07/31/2022   PLT 311 07/31/2022  Checking iron panel today Will start patient on iron supplement once lab returns if needed

## 2022-08-19 ENCOUNTER — Other Ambulatory Visit: Payer: Self-pay | Admitting: Nurse Practitioner

## 2022-08-19 DIAGNOSIS — D508 Other iron deficiency anemias: Secondary | ICD-10-CM

## 2022-08-19 DIAGNOSIS — E559 Vitamin D deficiency, unspecified: Secondary | ICD-10-CM

## 2022-08-19 LAB — VITAMIN D 25 HYDROXY (VIT D DEFICIENCY, FRACTURES): Vit D, 25-Hydroxy: 14.1 ng/mL — ABNORMAL LOW (ref 30.0–100.0)

## 2022-08-19 LAB — IRON,TIBC AND FERRITIN PANEL
Ferritin: 43 ng/mL (ref 15–150)
Iron Saturation: 8 % — CL (ref 15–55)
Iron: 24 ug/dL — ABNORMAL LOW (ref 27–159)
Total Iron Binding Capacity: 310 ug/dL (ref 250–450)
UIBC: 286 ug/dL (ref 131–425)

## 2022-08-19 MED ORDER — VITAMIN D (ERGOCALCIFEROL) 1.25 MG (50000 UNIT) PO CAPS: 50000.0000 [IU] | ORAL_CAPSULE | ORAL | 0 refills | Status: DC

## 2022-08-19 MED ORDER — FERROUS SULFATE 324 MG PO TBEC: 324.0000 mg | DELAYED_RELEASE_TABLET | Freq: Every day | ORAL | 0 refills | Status: DC

## 2022-08-19 NOTE — Progress Notes (Signed)
Iron deficiency anemia, start taking ferous sulphate 325mg  once daily  Vitamin D deficiency. Start taking vitamin D 50,000 units once weekly , eat foods rich in vitamin D like Cod liver oil,Salmon,Swordfish,Tuna fish,,Dairy and plant milks fortified with vitamin D,Sardines,Beef liver

## 2022-08-23 NOTE — Telephone Encounter (Signed)
Teresa Arnold is calling. Stated a new application is needed because the old version was submitted. Stated you can go to [email or web address] and get the new application that is needed

## 2022-08-23 NOTE — Telephone Encounter (Signed)
PW completed and placed in MD's office for review and signature.

## 2022-09-06 NOTE — Telephone Encounter (Signed)
Received email from Lois[employer] 09/01/22:

## 2022-09-06 NOTE — Telephone Encounter (Addendum)
I reviewed our outgoing fax correspondences on this. We originally sent pw on 12/4 and then updated PW on 12/20 with confirmation of both the old and new form.. I will resend yet again.

## 2022-09-25 ENCOUNTER — Ambulatory Visit: Payer: 59 | Admitting: Neurology

## 2022-10-19 ENCOUNTER — Ambulatory Visit: Payer: Medicare HMO

## 2022-11-07 ENCOUNTER — Encounter: Payer: Self-pay | Admitting: Neurology

## 2022-11-07 ENCOUNTER — Ambulatory Visit (INDEPENDENT_AMBULATORY_CARE_PROVIDER_SITE_OTHER): Payer: 59 | Admitting: Neurology

## 2022-11-07 DIAGNOSIS — R269 Unspecified abnormalities of gait and mobility: Secondary | ICD-10-CM | POA: Diagnosis not present

## 2022-11-07 DIAGNOSIS — G35 Multiple sclerosis: Secondary | ICD-10-CM

## 2022-11-07 DIAGNOSIS — R519 Headache, unspecified: Secondary | ICD-10-CM

## 2022-11-07 NOTE — Progress Notes (Signed)
Patient: Teresa Arnold Date of Birth: 06-04-1976  Reason for Visit: Follow up History from: Patient Primary Neurologist: Krista Blue   ASSESSMENT AND PLAN 47 y.o. year old female   1.  Relapsing remitting multiple sclerosis 2.  Gait abnormality 3.  Headache -Diagnosis 2012 presented with gait abnormality 6 months postpartum, 2017 left optic neuritis flare -On Rebif 2015-2018, good response to IV steroid April 2017 for left optic neuritis flare; had allergic reaction to Tysabri (diffuse body rash); on Mayzent since November 2020 (Stopped in October 2023 due to pregnancy with miscarriage, restarted December 2023) -Check labs today, including urine pregnancy, her menstrual cycle is 1 week late -I have discussed that pregnancy is not recommended while taking Mayzent, reviewed package insert, based on animal studies Mayzent may cause fetal harm, contraception is recommended while taking Mayzent and 10 days after stopping -If labs are unrevealing, order MRI of the brain and cervical spine without contrast due to vomiting allergy to contrast -Follow-up in 6 months  Addendum 11/08/22 SS: Urine pregnancy test was negative, absent lymphocyte count 0.2.  I ordered MRI of the brain and cervical spine without contrast to check for subclinical progression.    HISTORY  Teresa Arnold is a 47 year old female, patient of Dr. Jannifer Franklin, following up for relapsing remitting multiple sclerosis, currently pregnant,   I reviewed and summarized the referring note. PMHx. Relapsing remitting multiple sclerosis Iron deficiency anemia B12 deficiency D deficiency   She has 4 adult children, unexpected pregnancy, due day is in June 2024   She was diagnosed of relapsing remitting multiple sclerosis 6 months postpartum in 2012, presenting with balance difficulty, numbness of bilateral lower extremities   At her worst, she has to use walker, also developed left visual loss, fortunately symptoms has greatly  recovered following IV Solu-Medrol treatment   She was treated with lipid since diagnosis until 2018, had allergic reaction to [place] in the past diffuse body rash, has been treated with Mayzent since 2018, tolerating it well, no significant flareup   Last reported flareup once in 2018 with left optic neuritis  Update November 07, 2022 SS: Had a miscarriage with D&E 07/31/22, restarted Mayzent in December 2023.  Feels MS is overall stable.  Continues with chronic weakness to right leg, blurry vision to the left eye.  Her period is 1 week late, since pregnancy?  She has 2 children, youngest is 64.  Is not on birth control.  Labs 07/10/22 Hgb 10.0, absolute lymphocyte 0.2, JCV positive. 08/18/22 vitamin D 14.1 treated with prescription strength. Headaches are doing well.   REVIEW OF SYSTEMS: Out of a complete 14 system review of symptoms, the patient complains only of the following symptoms, and all other reviewed systems are negative.  See HPI  ALLERGIES: Allergies  Allergen Reactions   Other Shortness Of Breath and Itching    Tampons   Gadolinium Derivatives Nausea And Vomiting    Pt only received half dose of 83m before getting sick.    Sulfa Antibiotics Itching and Rash   Tysabri [Natalizumab] Rash    HOME MEDICATIONS: Outpatient Medications Prior to Visit  Medication Sig Dispense Refill   acetaminophen (TYLENOL) 325 MG tablet Take 650 mg by mouth every 6 (six) hours as needed for moderate pain.      ibuprofen (ADVIL) 600 MG tablet Take 1 tablet (600 mg total) by mouth every 6 (six) hours as needed. 60 tablet 3   oxyCODONE-acetaminophen (PERCOCET/ROXICET) 5-325 MG tablet Take 1 tablet by mouth every 6 (  six) hours as needed. 15 tablet 0   pantoprazole (PROTONIX) 40 MG tablet TAKE 1 TABLET BY MOUTH EVERY DAY 30 tablet 10   Siponimod Fumarate (MAYZENT) 2 MG TABS TAKE 1 TABLET (2MG) BY  MOUTH ONCE DAILY (DISCARD 3 MONTHS AFTER OPENING  BOTTLE) 90 tablet 3   valACYclovir (VALTREX) 1000  MG tablet Take 1 tablet (1,000 mg total) by mouth daily. (Patient taking differently: Take 1,000 mg by mouth daily. Takes as needed for outbreak.) 90 tablet 2   ferrous sulfate 324 MG TBEC Take 1 tablet (324 mg total) by mouth daily with breakfast. 90 tablet 0   Vitamin D, Ergocalciferol, (DRISDOL) 1.25 MG (50000 UNIT) CAPS capsule Take 1 capsule (50,000 Units total) by mouth every 7 (seven) days. 8 capsule 0   No facility-administered medications prior to visit.    PAST MEDICAL HISTORY: Past Medical History:  Diagnosis Date   Bronchitis    Chronic insomnia 05/22/2017   GERD (gastroesophageal reflux disease)    Iron deficiency anemia 05/22/2017   Morbid obesity ([place]) 05/22/2017   MS (multiple sclerosis) ([place])    Vitamin B12 deficiency 11/2019   Vitamin D deficiency 11/2019    PAST SURGICAL HISTORY: Past Surgical History:  Procedure Laterality Date   CESAREAN SECTION  04/26/1996   x4   CESAREAN SECTION  04/09/2007   CESAREAN SECTION  01/20/2009   CESAREAN SECTION  02/19/2010   DILATION AND EVACUATION N/A 07/31/2022   Procedure: DILATATION AND EVACUATION;  Surgeon: Mora Bellman, MD;  Location: [facility];  Service: Gynecology;  Laterality: N/A;    FAMILY HISTORY: Family History  Problem Relation Age of Onset   Stroke Father    Stroke Mother    Hypertension Mother    Hypertension Other     SOCIAL HISTORY: Social History   Socioeconomic History   Marital status: Single    Spouse name: Not on file   Number of children: 4   Years of education: 14   Highest education level: Not on file  Occupational History   Not on file  Tobacco Use   Smoking status: Never   Smokeless tobacco: Never  Vaping Use   Vaping Use: Never used  Substance and Sexual Activity   Alcohol use: Not Currently    Comment: not in 3 years   Drug use: No   Sexual activity: Yes    Partners: Male    Birth control/protection: None  Other Topics Concern   Not on file  Social History Narrative    Patient is single and lives with her family.   Patient has four children.   Patient has a college education.   Patient is right-handed.   Patient does not drink any caffeine.   Social Determinants of Health   Financial Resource Strain: Medium Risk (12/01/2021)   Overall Financial Resource Strain (CARDIA)    Difficulty of Paying Living Expenses: Somewhat hard  Food Insecurity: Food Insecurity Present (12/01/2021)   Hunger Vital Sign    Worried About Running Out of Food in the Last Year: Sometimes true    Ran Out of Food in the Last Year: Sometimes true  Transportation Needs: No Transportation Needs (12/01/2021)   PRAPARE - Hydrologist (Medical): No    Lack of Transportation (Non-Medical): No  Physical Activity: Insufficiently Active (12/01/2021)   Exercise Vital Sign    Days of Exercise per Week: 1 day    Minutes of Exercise per Session: 20 min  Stress: No Stress Concern  Present (12/01/2021)   [place]    Feeling of Stress : Not at all  Social Connections: Socially Isolated (12/01/2021)   Social Connection and Isolation Panel [NHANES]    Frequency of Communication with Friends and Family: Once a week    Frequency of Social Gatherings with Friends and Family: Once a week    Attends Religious Services: 1 to 4 times per year    Active Member of [employer] or Organizations: No    Attends Archivist Meetings: Never    Marital Status: Never married  Intimate Partner Violence: Not At Risk (12/01/2021)   Humiliation, Afraid, Rape, and Kick questionnaire    Fear of Current or Ex-Partner: No    Emotionally Abused: No    Physically Abused: No    Sexually Abused: No    PHYSICAL EXAM  Vitals:   11/07/22 0952  BP: 100/68  Pulse: 64  Weight: 217 lb (98.4 kg)  Height: 5' 4"$  (1.626 m)   Body mass index is 37.25 kg/m.  Generalized: Well developed, in no acute distress  Neurological  examination  Mentation: Alert oriented to time, place, history taking. Follows all commands speech and language fluent Cranial nerve II-XII: Pupils were equal round reactive to light. Extraocular movements were full, visual field were full on confrontational test. Facial sensation and strength were normal.  Head turning and shoulder shrug  were normal and symmetric. Motor: Right leg 3/5, right arm 4.5/5, left side is normal Sensory: Sensory testing is intact to soft touch on all 4 extremities. No evidence of extinction is noted.  Coordination: Cerebellar testing reveals good finger-nose-finger and heel-to-shin bilaterally.  Gait and station: Gait is wide-based, limp on the right Reflexes: Deep tendon reflexes are symmetric but increased to the right side  DIAGNOSTIC DATA (LABS, IMAGING, TESTING) - I reviewed patient records, labs, notes, testing and imaging myself where available.  Lab Results  Component Value Date   WBC 5.3 07/31/2022   HGB 10.2 (L) 07/31/2022   HCT 33.0 (L) 07/31/2022   MCV 87.1 07/31/2022   PLT 311 07/31/2022      Component Value Date/Time   NA 138 07/10/2022 1126   K 4.4 07/10/2022 1126   CL 103 07/10/2022 1126   CO2 23 07/10/2022 1126   GLUCOSE 82 07/10/2022 1126   GLUCOSE 86 05/21/2017 1142   BUN 13 07/10/2022 1126   CREATININE 0.64 07/10/2022 1126   CREATININE 0.67 05/21/2017 1142   CALCIUM 8.9 07/10/2022 1126   PROT 6.4 07/10/2022 1126   ALBUMIN 3.8 (L) 07/10/2022 1126   AST 14 07/10/2022 1126   ALT 12 07/10/2022 1126   ALKPHOS 62 07/10/2022 1126   BILITOT 0.2 07/10/2022 1126   GFRNONAA 110 06/08/2020 1018   GFRNONAA 111 05/21/2017 1142   GFRAA 127 06/08/2020 1018   GFRAA 128 05/21/2017 1142   Lab Results  Component Value Date   CHOL 159 11/26/2020   HDL 65 11/26/2020   LDLCALC 84 11/26/2020   TRIG 44 11/26/2020   CHOLHDL 2.4 11/26/2020   Lab Results  Component Value Date   HGBA1C 5.2 05/28/2020   HGBA1C 5.2 05/28/2020   HGBA1C 5.2 (A)  05/28/2020   HGBA1C 5.2 05/28/2020   Lab Results  Component Value Date   VITAMINB12 387 11/26/2020   Lab Results  Component Value Date   TSH 0.774 07/10/2022    Butler Denmark, AGNP-C, DNP 11/07/2022, 10:47 AM [facility] [address] [place],  [place] [address] [phone]

## 2022-11-07 NOTE — Patient Instructions (Signed)
Check labs today I do not recommend pregnancy while taking Mayzent, please refer to [facility] website and package insert about Mayzent If labs okay tomorrow will order MRI brain/cervical spine

## 2022-11-08 ENCOUNTER — Telehealth: Payer: Self-pay | Admitting: Neurology

## 2022-11-08 ENCOUNTER — Telehealth: Payer: Self-pay

## 2022-11-08 LAB — CBC WITH DIFFERENTIAL/PLATELET
Basophils Absolute: 0 10*3/uL (ref 0.0–0.2)
Basos: 0 %
EOS (ABSOLUTE): 0 10*3/uL (ref 0.0–0.4)
Eos: 1 %
Hematocrit: 34.1 % (ref 34.0–46.6)
Hemoglobin: 10.9 g/dL — ABNORMAL LOW (ref 11.1–15.9)
Immature Grans (Abs): 0.1 10*3/uL (ref 0.0–0.1)
Immature Granulocytes: 1 %
Lymphocytes Absolute: 0.2 10*3/uL — ABNORMAL LOW (ref 0.7–3.1)
Lymphs: 4 %
MCH: 25.5 pg — ABNORMAL LOW (ref 26.6–33.0)
MCHC: 32 g/dL (ref 31.5–35.7)
MCV: 80 fL (ref 79–97)
Monocytes Absolute: 0.6 10*3/uL (ref 0.1–0.9)
Monocytes: 14 %
Neutrophils Absolute: 3.2 10*3/uL (ref 1.4–7.0)
Neutrophils: 80 %
Platelets: 324 10*3/uL (ref 150–450)
RBC: 4.28 x10E6/uL (ref 3.77–5.28)
RDW: 12.4 % (ref 11.7–15.4)
WBC: 4.1 10*3/uL (ref 3.4–10.8)

## 2022-11-08 LAB — COMPREHENSIVE METABOLIC PANEL
ALT: 10 IU/L (ref 0–32)
AST: 14 IU/L (ref 0–40)
Albumin/Globulin Ratio: 1.5 (ref 1.2–2.2)
Albumin: 4.3 g/dL (ref 3.9–4.9)
Alkaline Phosphatase: 74 IU/L (ref 44–121)
BUN/Creatinine Ratio: 26 — ABNORMAL HIGH (ref 9–23)
BUN: 18 mg/dL (ref 6–24)
Bilirubin Total: 0.3 mg/dL (ref 0.0–1.2)
CO2: 23 mmol/L (ref 20–29)
Calcium: 9.2 mg/dL (ref 8.7–10.2)
Chloride: 104 mmol/L (ref 96–106)
Creatinine, Ser: 0.68 mg/dL (ref 0.57–1.00)
Globulin, Total: 2.9 g/dL (ref 1.5–4.5)
Glucose: 85 mg/dL (ref 70–99)
Potassium: 4.5 mmol/L (ref 3.5–5.2)
Sodium: 145 mmol/L — ABNORMAL HIGH (ref 134–144)
Total Protein: 7.2 g/dL (ref 6.0–8.5)
eGFR: 110 mL/min/{1.73_m2} (ref >59)

## 2022-11-08 LAB — PREGNANCY, URINE: Preg Test, Ur: NEGATIVE

## 2022-11-08 NOTE — Telephone Encounter (Signed)
[employer] medicare/[place] medicaid NPR sent to GI [phone]

## 2022-11-08 NOTE — Addendum Note (Signed)
Addended by: Suzzanne Cloud on: 11/08/2022 07:46 AM   Modules accepted: Orders

## 2022-11-08 NOTE — Telephone Encounter (Signed)
Sent a [facility] message as well

## 2022-11-10 ENCOUNTER — Ambulatory Visit: Payer: Self-pay | Admitting: Nurse Practitioner

## 2022-11-10 ENCOUNTER — Ambulatory Visit (INDEPENDENT_AMBULATORY_CARE_PROVIDER_SITE_OTHER): Payer: 59 | Admitting: Nurse Practitioner

## 2022-11-10 VITALS — BP 123/71 | HR 62 | Ht 64.0 in | Wt 220.0 lb

## 2022-11-10 DIAGNOSIS — N926 Irregular menstruation, unspecified: Secondary | ICD-10-CM

## 2022-11-10 NOTE — Progress Notes (Signed)
Acute Office Visit  Subjective:     Patient ID: Teresa Arnold, female    DOB: January 04, 1976, 47 y.o.   MRN: [id number]  Chief Complaint  Patient presents with   Follow-up    Blood test for pregnancy.    HPI Ms. Ohayon who  has a past medical history of Bronchitis, Chronic insomnia (05/22/2017), GERD (gastroesophageal reflux disease), Iron deficiency anemia (05/22/2017), Morbid obesity ([place]) (05/22/2017), MS (multiple sclerosis) ([place]), Vitamin B12 deficiency (11/2019), and Vitamin D deficiency (11/2019).  Presents for pregnancy test.  She is currently on medication for MS stated that this medication is contraindicated with pregnancy her last menstrual period was01/23/204.  She had taking pregnancy test at home and in the neurologist office due with negative but she would like all blood test done to rule out pregnancy.  She is currently holding [place] until she confirms that she is not pregnant.  She denies nausea, vomiting, abdominal pain.    Review of Systems  Constitutional: Negative.   Respiratory: Negative.    Cardiovascular: Negative.   Genitourinary: Negative.   Neurological:  Negative for tingling, tremors, seizures and loss of consciousness.  Psychiatric/Behavioral: Negative.          Objective:    BP 123/71   Pulse 62   Ht '5\' 4"'$  (1.626 m)   Wt 220 lb (99.8 kg)   LMP 05/22/2022 (Approximate)   SpO2 100%   BMI 37.76 kg/m    Physical Exam Constitutional:      General: She is not in acute distress.    Appearance: Normal appearance. She is obese. She is not ill-appearing, toxic-appearing or diaphoretic.  Eyes:     General: No scleral icterus.       Right eye: No discharge.        Left eye: No discharge.     Extraocular Movements: Extraocular movements intact.  Cardiovascular:     Rate and Rhythm: Normal rate and regular rhythm.     Pulses: Normal pulses.     Heart sounds: Normal heart sounds. No murmur heard.    No friction rub. No gallop.  Pulmonary:      Effort: Pulmonary effort is normal. No respiratory distress.     Breath sounds: Normal breath sounds. No stridor. No wheezing, rhonchi or rales.  Chest:     Chest wall: No tenderness.  Abdominal:     General: There is no distension.     Palpations: Abdomen is soft.     Tenderness: There is no abdominal tenderness. There is no guarding.  Musculoskeletal:        General: No tenderness.     Right lower leg: No edema.     Left lower leg: No edema.  Skin:    General: Skin is warm and dry.     Coloration: Skin is not jaundiced or pale.  Neurological:     Mental Status: She is alert and oriented to person, place, and time.     Coordination: Coordination normal.     Gait: Gait normal.  Psychiatric:        Mood and Affect: Mood normal.        Behavior: Behavior normal.        Thought Content: Thought content normal.        Judgment: Judgment normal.     No results found for any visits on 11/10/22.      Assessment & Plan:   Problem List Items Addressed This Visit  Other   Missed period - Primary   Relevant Orders   hCG, serum, qualitative    No orders of the defined types were placed in this encounter.   No follow-ups on file.  Renee Rival, FNP

## 2022-11-10 NOTE — Patient Instructions (Signed)
1. Missed period  - hCG, serum, qualitative    It is important that you exercise regularly at least 30 minutes 5 times a week as tolerated  Think about what you will eat, plan ahead. Choose " clean, green, fresh or frozen" over canned, processed or packaged foods which are more sugary, salty and fatty. 70 to 75% of food eaten should be vegetables and fruit. Three meals at set times with snacks allowed between meals, but they must be fruit or vegetables. Aim to eat over a 12 hour period , example 7 am to 7 pm, and STOP after  your last meal of the day. Drink water,generally about 64 ounces per day, no other drink is as healthy. Fruit juice is best enjoyed in a healthy way, by EATING the fruit.  Thanks for choosing Patient Teresa Arnold we consider it a privelige to serve you.

## 2022-11-11 LAB — HCG, SERUM, QUALITATIVE: hCG,Beta Subunit,Qual,Serum: NEGATIVE m[IU]/mL (ref <6)

## 2022-11-13 ENCOUNTER — Other Ambulatory Visit: Payer: Self-pay | Admitting: Nurse Practitioner

## 2022-11-13 DIAGNOSIS — E559 Vitamin D deficiency, unspecified: Secondary | ICD-10-CM

## 2022-11-14 NOTE — Telephone Encounter (Signed)
Please advise if you want the pt to continue . [place]

## 2022-12-04 ENCOUNTER — Ambulatory Visit: Payer: Medicare HMO | Admitting: Nurse Practitioner

## 2022-12-04 ENCOUNTER — Ambulatory Visit
Admission: RE | Admit: 2022-12-04 | Discharge: 2022-12-04 | Disposition: A | Discharge status: Home / Self Care | Payer: 59 | Source: Ambulatory Visit | Attending: Neurology | Admitting: Neurology

## 2022-12-04 DIAGNOSIS — G35 Multiple sclerosis: Secondary | ICD-10-CM | POA: Diagnosis not present

## 2022-12-12 ENCOUNTER — Ambulatory Visit: Payer: 59

## 2022-12-12 ENCOUNTER — Ambulatory Visit
Admission: RE | Admit: 2022-12-12 | Discharge: 2022-12-12 | Disposition: A | Discharge status: Home / Self Care | Payer: 59 | Source: Ambulatory Visit | Attending: Nurse Practitioner | Admitting: Nurse Practitioner

## 2022-12-12 DIAGNOSIS — N6310 Unspecified lump in the right breast, unspecified quadrant: Secondary | ICD-10-CM

## 2022-12-27 ENCOUNTER — Telehealth: Payer: Self-pay | Admitting: Nurse Practitioner

## 2022-12-27 NOTE — Telephone Encounter (Signed)
Contacted Teresa Arnold to schedule their annual wellness visit. Appointment made for 12/28/2022.  Thank you,  [facility] Support [facility] Direct dial  [phone]

## 2022-12-28 ENCOUNTER — Ambulatory Visit (INDEPENDENT_AMBULATORY_CARE_PROVIDER_SITE_OTHER): Payer: 59

## 2022-12-28 VITALS — Ht 64.0 in | Wt 220.0 lb

## 2022-12-28 DIAGNOSIS — Z0000 Encounter for general adult medical examination without abnormal findings: Secondary | ICD-10-CM | POA: Diagnosis not present

## 2022-12-28 NOTE — Patient Instructions (Addendum)
[name] , Thank you for taking time to come for your Medicare Wellness Visit. I appreciate your ongoing commitment to your health goals. Please review the following plan we discussed and let me know if I can assist you in the future.   These are the goals we discussed:  Goals       Lose weight (pt-stated)        This is a list of the screening recommended for you and due dates:  Health Maintenance  Topic Date Due   DTaP/Tdap/Td vaccine (1 - Tdap) Never done   COVID-19 Vaccine (3 - Pfizer risk series) 09/04/2023*   HIV Screening  12/28/2023*   Flu Shot  04/12/2023   Medicare Annual Wellness Visit  12/28/2023   Pap Smear  02/13/2025   Hepatitis C Screening: USPSTF Recommendation to screen - Ages 18-79 yo.  Completed   HPV Vaccine  Aged Out  *Topic was postponed. The date shown is not the original due date.  Opioid Pain Medicine Management Opioids are powerful medicines that are used to treat moderate to severe pain. When used for short periods of time, they can help you to: Sleep better. Do better in physical or occupational therapy. Feel better in the first few days after an injury. Recover from surgery. Opioids should be taken with the supervision of a trained health care provider. They should be taken for the shortest period of time possible. This is because opioids can be addictive, and the longer you take opioids, the greater your risk of addiction. This addiction can also be called opioid use disorder. What are the risks? Using opioid pain medicines for longer than 3 days increases your risk of side effects. Side effects include: Constipation. Nausea and vomiting. Breathing difficulties (respiratory depression). Drowsiness. Confusion. Opioid use disorder. Itching. Taking opioid pain medicine for a long period of time can affect your ability to do daily tasks. It also puts you at risk for: Motor vehicle crashes. Depression. Suicide. Heart attack. Overdose, which can  be life-threatening. What is a pain treatment plan? A pain treatment plan is an agreement between you and your health care provider. Pain is unique to each person, and treatments vary depending on your condition. To manage your pain, you and your health care provider need to work together. To help you do this: Discuss the goals of your treatment, including how much pain you might expect to have and how you will manage the pain. Review the risks and benefits of taking opioid medicines. Remember that a good treatment plan uses more than one approach and minimizes the chance of side effects. Be honest about the amount of medicines you take and about any drug or alcohol use. Get pain medicine prescriptions from only one health care provider. Pain can be managed with many types of alternative treatments. Ask your health care provider to refer you to one or more specialists who can help you manage pain through: Physical or occupational therapy. Counseling (cognitive behavioral therapy). Good nutrition. Biofeedback. Massage. Meditation. Non-opioid medicine. Following a gentle exercise program. How to use opioid pain medicine Taking medicine Take your pain medicine exactly as told by your health care provider. Take it only when you need it. If your pain gets less severe, you may take less than your prescribed dose if your health care provider approves. If you are not having pain, do nottake pain medicine unless your health care provider tells you to take it. If your pain is severe, do nottry to treat  it yourself by taking more pills than instructed on your prescription. Contact your health care provider for help. Write down the times when you take your pain medicine. It is easy to become confused while on pain medicine. Writing the time can help you avoid overdose. Take other over-the-counter or prescription medicines only as told by your health care provider. Keeping yourself and others  safe  While you are taking opioid pain medicine: Do not drive, use machinery, or power tools. Do not sign legal documents. Do not drink alcohol. Do not take sleeping pills. Do not supervise children by yourself. Do not do activities that require climbing or being in high places. Do not go to a lake, river, ocean, spa, or swimming pool. Do not share your pain medicine with anyone. Keep pain medicine in a locked cabinet or in a secure area where pets and children cannot reach it. Stopping your use of opioids If you have been taking opioid medicine for more than a few weeks, you may need to slowly decrease (taper) how much you take until you stop completely. Tapering your use of opioids can decrease your risk of symptoms of withdrawal, such as: Pain and cramping in the abdomen. Nausea. Sweating. Sleepiness. Restlessness. Uncontrollable shaking (tremors). Cravings for the medicine. Do not attempt to taper your use of opioids on your own. Talk with your health care provider about how to do this. Your health care provider may prescribe a step-down schedule based on how much medicine you are taking and how long you have been taking it. Getting rid of leftover pills Do not save any leftover pills. Get rid of leftover pills safely by: Taking the medicine to a prescription take-back program. This is usually offered by the county or law enforcement. Bringing them to a pharmacy that has a drug disposal container. Flushing them down the toilet. Check the label or package insert of your medicine to see whether this is safe to do. Throwing them out in the trash. Check the label or package insert of your medicine to see whether this is safe to do. If it is safe to throw it out, remove the medicine from the original container, put it into a sealable bag or container, and mix it with used coffee grounds, food scraps, dirt, or cat litter before putting it in the trash. Follow these instructions at  home: Activity Do exercises as told by your health care provider. Avoid activities that make your pain worse. Return to your normal activities as told by your health care provider. Ask your health care provider what activities are safe for you. General instructions You may need to take these actions to prevent or treat constipation: Drink enough fluid to keep your urine pale yellow. Take over-the-counter or prescription medicines. Eat foods that are high in fiber, such as beans, whole grains, and fresh fruits and vegetables. Limit foods that are high in fat and processed sugars, such as fried or sweet foods. Keep all follow-up visits. This is important. Where to find support If you have been taking opioids for a long time, you may benefit from receiving support for quitting from a local support group or counselor. Ask your health care provider for a referral to these resources in your area. Where to find more information Centers for Disease Control and Prevention (CDC): FootballExhibition.com.br [place] Food and Drug Administration (FDA): PumpkinSearch.com.ee Get help right away if: You may have taken too much of an opioid (overdosed). Common symptoms of an overdose: Your breathing is  slower or more shallow than normal. You have a very slow heartbeat (pulse). You have slurred speech. You have nausea and vomiting. Your pupils become very small. You have other potential symptoms: You are very confused. You faint or feel like you will faint. You have cold, clammy skin. You have blue lips or fingernails. You have thoughts of harming yourself or harming others. These symptoms may represent a serious problem that is an emergency. Do not wait to see if the symptoms will go away. Get medical help right away. Call your local emergency services (911 in the [place] Do not drive yourself to the hospital.  If you ever feel like you may hurt yourself or others, or have thoughts about taking your own life, get help right away.  Go to your nearest emergency department or: Call your local emergency services (911 in the [place] Call the [facility] (9841017213 in the [place] Call a suicide crisis helpline, such as the National Suicide Prevention Lifeline at 5397555294 or 988 in the [place] This is open 24 hours a day in the [place] Text the Crisis Text Line at 4587010430 (in the [place] Summary Opioid medicines can help you manage moderate to severe pain for a short period of time. A pain treatment plan is an agreement between you and your health care provider. Discuss the goals of your treatment, including how much pain you might expect to have and how you will manage the pain. If you think that you or someone else may have taken too much of an opioid, get medical help right away. This information is not intended to replace advice given to you by your health care provider. Make sure you discuss any questions you have with your health care provider. Document Revised: 03/23/2021 Document Reviewed: 12/08/2020 Elsevier Patient Education  2023 [employer]   Advanced directives: Advance directive discussed with you today. Even though you declined this today, please call our office should you change your mind, and we can give you the proper paperwork for you to fill out.   Conditions/risks identified: None  Next appointment: Follow up in one year for your annual wellness visit.    Preventive Care 40-64 Years, Female Preventive care refers to lifestyle choices and visits with your health care provider that can promote health and wellness. What does preventive care include? A yearly physical exam. This is also called an annual well check. Dental exams once or twice a year. Routine eye exams. Ask your health care provider how often you should have your eyes checked. Personal lifestyle choices, including: Daily care of your teeth and gums. Regular physical activity. Eating a healthy diet. Avoiding tobacco  and drug use. Limiting alcohol use. Practicing safe sex. Taking low-dose aspirin daily starting at age 84. Taking vitamin and mineral supplements as recommended by your health care provider. What happens during an annual well check? The services and screenings done by your health care provider during your annual well check will depend on your age, overall health, lifestyle risk factors, and family history of disease. Counseling  Your health care provider may ask you questions about your: Alcohol use. Tobacco use. Drug use. Emotional well-being. Home and relationship well-being. Sexual activity. Eating habits. Work and work Astronomer. Method of birth control. Menstrual cycle. Pregnancy history. Screening  You may have the following tests or measurements: Height, weight, and BMI. Blood pressure. Lipid and cholesterol levels. These may be checked every 5 years, or more frequently if you are over 25 years old.  Skin check. Lung cancer screening. You may have this screening every year starting at age 31 if you have a 30-pack-year history of smoking and currently smoke or have quit within the past 15 years. Fecal occult blood test (FOBT) of the stool. You may have this test every year starting at age 92. Flexible sigmoidoscopy or colonoscopy. You may have a sigmoidoscopy every 5 years or a colonoscopy every 10 years starting at age 63. Hepatitis C blood test. Hepatitis B blood test. Sexually transmitted disease (STD) testing. Diabetes screening. This is done by checking your blood sugar (glucose) after you have not eaten for a while (fasting). You may have this done every 1-3 years. Mammogram. This may be done every 1-2 years. Talk to your health care provider about when you should start having regular mammograms. This may depend on whether you have a family history of breast cancer. BRCA-related cancer screening. This may be done if you have a family history of breast, ovarian, tubal,  or peritoneal cancers. Pelvic exam and Pap test. This may be done every 3 years starting at age 74. Starting at age 68, this may be done every 5 years if you have a Pap test in combination with an HPV test. Bone density scan. This is done to screen for osteoporosis. You may have this scan if you are at high risk for osteoporosis. Discuss your test results, treatment options, and if necessary, the need for more tests with your health care provider. Vaccines  Your health care provider may recommend certain vaccines, such as: Influenza vaccine. This is recommended every year. Tetanus, diphtheria, and acellular pertussis (Tdap, Td) vaccine. You may need a Td booster every 10 years. Zoster vaccine. You may need this after age 34. Pneumococcal 13-valent conjugate (PCV13) vaccine. You may need this if you have certain conditions and were not previously vaccinated. Pneumococcal polysaccharide (PPSV23) vaccine. You may need one or two doses if you smoke cigarettes or if you have certain conditions. Talk to your health care provider about which screenings and vaccines you need and how often you need them. This information is not intended to replace advice given to you by your health care provider. Make sure you discuss any questions you have with your health care provider. Document Released: 09/24/2015 Document Revised: 05/17/2016 Document Reviewed: 06/29/2015 Elsevier Interactive Patient Education  2017 [employer].    Fall Prevention in the Home Falls can cause injuries. They can happen to people of all ages. There are many things you can do to make your home safe and to help prevent falls. What can I do on the outside of my home? Regularly fix the edges of walkways and driveways and fix any cracks. Remove anything that might make you trip as you walk through a door, such as a raised step or threshold. Trim any bushes or trees on the path to your home. Use bright outdoor lighting. Clear any  walking paths of anything that might make someone trip, such as rocks or tools. Regularly check to see if handrails are loose or broken. Make sure that both sides of any steps have handrails. Any raised decks and porches should have guardrails on the edges. Have any leaves, snow, or ice cleared regularly. Use sand or salt on walking paths during winter. Clean up any spills in your garage right away. This includes oil or grease spills. What can I do in the bathroom? Use night lights. Install grab bars by the toilet and in the tub and shower.  Do not use towel bars as grab bars. Use non-skid mats or decals in the tub or shower. If you need to sit down in the shower, use a plastic, non-slip stool. Keep the floor dry. Clean up any water that spills on the floor as soon as it happens. Remove soap buildup in the tub or shower regularly. Attach bath mats securely with double-sided non-slip rug tape. Do not have throw rugs and other things on the floor that can make you trip. What can I do in the bedroom? Use night lights. Make sure that you have a light by your bed that is easy to reach. Do not use any sheets or blankets that are too big for your bed. They should not hang down onto the floor. Have a firm chair that has side arms. You can use this for support while you get dressed. Do not have throw rugs and other things on the floor that can make you trip. What can I do in the kitchen? Clean up any spills right away. Avoid walking on wet floors. Keep items that you use a lot in easy-to-reach places. If you need to reach something above you, use a strong step stool that has a grab bar. Keep electrical cords out of the way. Do not use floor polish or wax that makes floors slippery. If you must use wax, use non-skid floor wax. Do not have throw rugs and other things on the floor that can make you trip. What can I do with my stairs? Do not leave any items on the stairs. Make sure that there are  handrails on both sides of the stairs and use them. Fix handrails that are broken or loose. Make sure that handrails are as long as the stairways. Check any carpeting to make sure that it is firmly attached to the stairs. Fix any carpet that is loose or worn. Avoid having throw rugs at the top or bottom of the stairs. If you do have throw rugs, attach them to the floor with carpet tape. Make sure that you have a light switch at the top of the stairs and the bottom of the stairs. If you do not have them, ask someone to add them for you. What else can I do to help prevent falls? Wear shoes that: Do not have high heels. Have rubber bottoms. Are comfortable and fit you well. Are closed at the toe. Do not wear sandals. If you use a stepladder: Make sure that it is fully opened. Do not climb a closed stepladder. Make sure that both sides of the stepladder are locked into place. Ask someone to hold it for you, if possible. Clearly mark and make sure that you can see: Any grab bars or handrails. First and last steps. Where the edge of each step is. Use tools that help you move around (mobility aids) if they are needed. These include: Canes. Walkers. Scooters. Crutches. Turn on the lights when you go into a dark area. Replace any light bulbs as soon as they burn out. Set up your furniture so you have a clear path. Avoid moving your furniture around. If any of your floors are uneven, fix them. If there are any pets around you, be aware of where they are. Review your medicines with your doctor. Some medicines can make you feel dizzy. This can increase your chance of falling. Ask your doctor what other things that you can do to help prevent falls. This information is not intended to replace  advice given to you by your health care provider. Make sure you discuss any questions you have with your health care provider. Document Released: 06/24/2009 Document Revised: 02/03/2016 Document Reviewed:  10/02/2014 Elsevier Interactive Patient Education  2017 [employer].

## 2022-12-28 NOTE — Progress Notes (Signed)
Subjective:   Teresa Arnold is a 47 y.o. female who presents for Medicare Annual (Subsequent) preventive examination.  Review of Systems    Virtual Visit via Telephone Note  I connected with  Teresa Arnold on 12/28/22 at  1:00 PM EDT by telephone and verified that I am speaking with the correct person using two identifiers.  Location: Patient: Home Provider: Office Persons participating in the virtual visit: patient/Nurse Health Advisor   I discussed the limitations, risks, security and privacy concerns of performing an evaluation and management service by telephone and the availability of in person appointments. The patient expressed understanding and agreed to proceed.  Interactive audio and video telecommunications were attempted between this nurse and patient, however failed, due to patient having technical difficulties OR patient did not have access to video capability.  We continued and completed visit with audio only.  Some vital signs may be absent or patient reported.   Tillie Rung, LPN  Cardiac Risk Factors include: advanced age (>66men, >75 women);Other (see comment), Risk factor comments: Dx: Hypotenntion     Objective:    Today's Vitals   12/28/22 1310  Weight: 220 lb (99.8 kg)  Height: 5\' 4"  (1.626 m)   Body mass index is 37.76 kg/m.     12/28/2022    1:16 PM 07/31/2022    7:26 AM 12/01/2021    2:54 PM 08/02/2020    9:38 AM 08/13/2018    1:43 PM 06/24/2018    2:35 PM 10/28/2017    4:27 AM  Advanced Directives  Does Patient Have a Medical Advance Directive? No No No Yes No No No  Would patient like information on creating a medical advance directive? No - Patient declined No - Patient declined No - Patient declined  No - Patient declined No - Patient declined     Current Medications (verified) Outpatient Encounter Medications as of 12/28/2022  Medication Sig   acetaminophen (TYLENOL) 325 MG tablet Take 650 mg by mouth every 6 (six) hours  as needed for moderate pain.    ibuprofen (ADVIL) 600 MG tablet Take 1 tablet (600 mg total) by mouth every 6 (six) hours as needed.   oxyCODONE-acetaminophen (PERCOCET/ROXICET) 5-325 MG tablet Take 1 tablet by mouth every 6 (six) hours as needed.   pantoprazole (PROTONIX) 40 MG tablet TAKE 1 TABLET BY MOUTH EVERY DAY   Siponimod Fumarate (MAYZENT) 2 MG TABS TAKE 1 TABLET (2MG ) BY  MOUTH ONCE DAILY (DISCARD 3 MONTHS AFTER OPENING  BOTTLE) (Patient not taking: Reported on 11/10/2022)   valACYclovir (VALTREX) 1000 MG tablet Take 1 tablet (1,000 mg total) by mouth daily. (Patient taking differently: Take 1,000 mg by mouth daily. Takes as needed for outbreak.)   No facility-administered encounter medications on file as of 12/28/2022.    Allergies (verified) Other, Gadolinium derivatives, Sulfa antibiotics, and Tysabri [natalizumab]   History: Past Medical History:  Diagnosis Date   Bronchitis    Chronic insomnia 05/22/2017   GERD (gastroesophageal reflux disease)    Iron deficiency anemia 05/22/2017   Morbid obesity 05/22/2017   MS (multiple sclerosis)    Vitamin B12 deficiency 11/2019   Vitamin D deficiency 11/2019   Past Surgical History:  Procedure Laterality Date   CESAREAN SECTION  04/26/1996   x4   CESAREAN SECTION  04/09/2007   CESAREAN SECTION  01/20/2009   CESAREAN SECTION  02/19/2010   DILATION AND EVACUATION N/A 07/31/2022   Procedure: DILATATION AND EVACUATION;  Surgeon: Catalina Antigua, MD;  Location:  [facility] OR;  Service: Gynecology;  Laterality: N/A;   Family History  Problem Relation Age of Onset   Stroke Father    Stroke Mother    Hypertension Mother    Hypertension Other    Social History   Socioeconomic History   Marital status: Single    Spouse name: Not on file   Number of children: 4   Years of education: 14   Highest education level: Not on file  Occupational History   Not on file  Tobacco Use   Smoking status: Never   Smokeless tobacco: Never  Vaping  Use   Vaping Use: Never used  Substance and Sexual Activity   Alcohol use: Not Currently    Comment: not in 3 years   Drug use: No   Sexual activity: Yes    Partners: Male    Birth control/protection: None  Other Topics Concern   Not on file  Social History Narrative   Patient is single and lives with her family.   Patient has four children.   Patient has a college education.   Patient is right-handed.   Patient does not drink any caffeine.   Social Determinants of Health   Financial Resource Strain: Low Risk  (12/28/2022)   Overall Financial Resource Strain (CARDIA)    Difficulty of Paying Living Expenses: Not hard at all  Food Insecurity: No Food Insecurity (12/28/2022)   Hunger Vital Sign    Worried About Running Out of Food in the Last Year: Never true    Ran Out of Food in the Last Year: Never true  Transportation Needs: No Transportation Needs (12/28/2022)   PRAPARE - Administrator, Civil Service (Medical): No    Lack of Transportation (Non-Medical): No  Physical Activity: Inactive (12/28/2022)   Exercise Vital Sign    Days of Exercise per Week: 0 days    Minutes of Exercise per Session: 0 min  Stress: No Stress Concern Present (12/28/2022)   [employer] of Occupational Health - Occupational Stress Questionnaire    Feeling of Stress : Not at all  Social Connections: Socially Isolated (12/28/2022)   Social Connection and Isolation Panel [NHANES]    Frequency of Communication with Friends and Family: More than three times a week    Frequency of Social Gatherings with Friends and Family: More than three times a week    Attends Religious Services: Never    Database administrator or Organizations: No    Attends Engineer, structural: Never    Marital Status: Never married    Tobacco Counseling Counseling given: Not Answered   Clinical Intake:  Pre-visit preparation completed: No  Pain : No/denies pain     BMI - recorded:  37.76 Nutritional Status: BMI > 30  Obese Nutritional Risks: None Diabetes: No  How often do you need to have someone help you when you read instructions, pamphlets, or other written materials from your doctor or pharmacy?: 1 - Never  Diabetic?  No  Interpreter Needed?: No  Information entered by :: Theresa Mulligan LPN   Activities of Daily Living    12/28/2022    1:14 PM 07/31/2022    7:30 AM  In your present state of health, do you have any difficulty performing the following activities:  Hearing? 0 0  Vision? 0 0  Difficulty concentrating or making decisions? 0 0  Walking or climbing stairs? 0 0  Dressing or bathing? 0 0  Doing errands, shopping? 0  Preparing Food and eating ? N   Using the Toilet? N   In the past six months, have you accidently leaked urine? N   Do you have problems with loss of bowel control? N   Managing your Medications? N   Managing your Finances? N   Housekeeping or managing your Housekeeping? N     Patient Care Team: Donell Beers, FNP as PCP - General (Nurse Practitioner) Brock Bad, MD as Consulting Physician (Obstetrics and Gynecology)  Indicate any recent Medical Services you may have received from other than [facility] providers in the past year (date may be approximate).     Assessment:   This is a routine wellness examination for Teresa Arnold.  Hearing/Vision screen Hearing Screening - Comments:: Denies hearing difficulties   Vision Screening - Comments:: Wears rx glasses - up to date with routine eye exams with  Deferred  Dietary issues and exercise activities discussed: Exercise limited by: None identified   Goals Addressed               This Visit's Progress     Lose weight (pt-stated)         Depression Screen    12/28/2022    1:14 PM 11/10/2022    1:17 PM 08/18/2022   10:48 AM 07/27/2022   10:55 AM 12/01/2021    2:31 PM 12/01/2021    2:16 PM 11/28/2021   11:46 AM  PHQ 2/9 Scores  PHQ - 2 Score 0 0 1 0 0 0 0   PHQ- 9 Score    1 1      Fall Risk    12/28/2022    1:16 PM 11/28/2021   11:46 AM 05/30/2021   11:46 AM 05/28/2020   10:45 AM 11/21/2019   11:44 AM  Fall Risk   Falls in the past year? 0 0 0 0 0  Number falls in past yr: 0 0 0 0 0  Injury with Fall? 0 0 0 0   Risk for fall due to : No Fall Risks No Fall Risks  No Fall Risks   Follow up Falls prevention discussed Falls evaluation completed       FALL RISK PREVENTION PERTAINING TO THE HOME:  Any stairs in or around the home? No  If so, are there any without handrails? No  Home free of loose throw rugs in walkways, pet beds, electrical cords, etc? Yes  Adequate lighting in your home to reduce risk of falls? No   ASSISTIVE DEVICES UTILIZED TO PREVENT FALLS:  Life alert? No  Use of a cane, walker or w/c? No  Grab bars in the bathroom? No  Shower chair or bench in shower? No  Elevated toilet seat or a handicapped toilet? No   TIMED UP AND GO:  Was the test performed? No . Audio visit   Cognitive Function:    12/01/2021    2:34 PM  MMSE - Mini Mental State Exam  Not completed: Unable to complete        12/28/2022    1:16 PM 12/01/2021    2:23 PM  6CIT Screen  What Year? 0 points 0 points  What month? 0 points 0 points  What time? 0 points 0 points  Count back from 20 0 points 0 points  Months in reverse 0 points 0 points  Repeat phrase 0 points 0 points  Total Score 0 points 0 points    Immunizations Immunization History  Administered Date(s) Administered   Influenza-Unspecified 06/03/2019  PFIZER(Purple Top)SARS-COV-2 Vaccination 06/11/2020, 07/02/2020    TDAP status: Due, Education has been provided regarding the importance of this vaccine. Advised may receive this vaccine at local pharmacy or Health Dept. Aware to provide a copy of the vaccination record if obtained from local pharmacy or Health Dept. Verbalized acceptance and understanding.  Flu Vaccine status: Up to date    Covid-19 vaccine status:  Completed vaccines  Qualifies for Shingles Vaccine? No   Zostavax completed No   Shingrix Completed?: No.    Education has been provided regarding the importance of this vaccine. Patient has been advised to call insurance company to determine out of pocket expense if they have not yet received this vaccine. Advised may also receive vaccine at local pharmacy or Health Dept. Verbalized acceptance and understanding.  Screening Tests Health Maintenance  Topic Date Due   DTaP/Tdap/Td (1 - Tdap) Never done   COVID-19 Vaccine (3 - Pfizer risk series) 09/04/2023 (Originally 07/30/2020)   HIV Screening  12/28/2023 (Originally 05/20/1993)   INFLUENZA VACCINE  04/12/2023   Medicare Annual Wellness (AWV)  12/28/2023   PAP SMEAR-Modifier  02/13/2025   Hepatitis C Screening  Completed   HPV VACCINES  Aged Out    Health Maintenance  Health Maintenance Due  Topic Date Due   DTaP/Tdap/Td (1 - Tdap) Never done          Lung Cancer Screening: (Low Dose CT Chest recommended if Age 69-80 years, 30 pack-year currently smoking OR have quit w/in 15years.) does not qualify.     Additional Screening:  Hepatitis C Screening: 01/31/18 qualify; Completed    Vision Screening: Recommended annual ophthalmology exams for early detection of glaucoma and other disorders of the eye. Is the patient up to date with their annual eye exam?  Yes  Who is the provider or what is the name of the office in which the patient attends annual eye exams? Deferred If pt is not established with a provider, would they like to be referred to a provider to establish care? No .   Dental Screening: Recommended annual dental exams for proper oral hygiene  Community Resource Referral / Chronic Care Management:  CRR required this visit?  No   CCM required this visit?  No      Plan:     I have personally reviewed and noted the following in the patient's chart:   Medical and social history Use of alcohol, tobacco or  illicit drugs  Current medications and supplements including opioid prescriptions. Patient is currently taking opioid prescriptions. Information provided to patient regarding non-opioid alternatives. Patient advised to discuss non-opioid treatment plan with their provider. Functional ability and status Nutritional status Physical activity Advanced directives List of other physicians Hospitalizations, surgeries, and ER visits in previous 12 months Vitals Screenings to include cognitive, depression, and falls Referrals and appointments  In addition, I have reviewed and discussed with patient certain preventive protocols, quality metrics, and best practice recommendations. A written personalized care plan for preventive services as well as general preventive health recommendations were provided to patient.     Tillie Rung, LPN   1/61/0960   Nurse Notes: Patient due HIV Screening

## 2023-02-06 ENCOUNTER — Telehealth: Payer: Self-pay | Admitting: Neurology

## 2023-02-06 ENCOUNTER — Encounter: Payer: Self-pay | Admitting: Neurology

## 2023-02-06 MED ORDER — MAYZENT 2 MG PO TABS: ORAL_TABLET | ORAL | 0 refills | Status: DC

## 2023-02-06 NOTE — Telephone Encounter (Signed)
Mychart msg sent

## 2023-02-06 NOTE — Addendum Note (Signed)
Addended by: Danne Harbor on: 02/06/2023 11:36 AM   Modules accepted: Orders

## 2023-02-06 NOTE — Telephone Encounter (Signed)
Pt called wanting to speak to the RN or provider regarding her Mayzant and the refill for it that was denied. Please advise.

## 2023-02-14 ENCOUNTER — Ambulatory Visit: Payer: Self-pay | Admitting: Nurse Practitioner

## 2023-02-16 ENCOUNTER — Ambulatory Visit: Payer: Self-pay | Admitting: Nurse Practitioner

## 2023-02-16 ENCOUNTER — Ambulatory Visit (INDEPENDENT_AMBULATORY_CARE_PROVIDER_SITE_OTHER): Payer: 59 | Admitting: Nurse Practitioner

## 2023-02-16 ENCOUNTER — Encounter: Payer: Self-pay | Admitting: Nurse Practitioner

## 2023-02-16 VITALS — BP 108/61 | HR 66 | Temp 97.7°F | Ht 63.0 in | Wt 220.4 lb

## 2023-02-16 DIAGNOSIS — E6609 Other obesity due to excess calories: Secondary | ICD-10-CM

## 2023-02-16 DIAGNOSIS — D509 Iron deficiency anemia, unspecified: Secondary | ICD-10-CM | POA: Diagnosis not present

## 2023-02-16 DIAGNOSIS — Z6833 Body mass index (BMI) 33.0-33.9, adult: Secondary | ICD-10-CM

## 2023-02-16 DIAGNOSIS — Z30011 Encounter for initial prescription of contraceptive pills: Secondary | ICD-10-CM | POA: Insufficient documentation

## 2023-02-16 DIAGNOSIS — E559 Vitamin D deficiency, unspecified: Secondary | ICD-10-CM

## 2023-02-16 LAB — POCT URINE PREGNANCY: Preg Test, Ur: NEGATIVE

## 2023-02-16 MED ORDER — NORETHINDRONE 0.35 MG PO TABS: 1.0000 | ORAL_TABLET | Freq: Every day | ORAL | 11 refills | Status: DC

## 2023-02-16 NOTE — Assessment & Plan Note (Addendum)
Wt Readings from Last 3 Encounters:  02/16/23 220 lb 6.4 oz (100 kg)  12/28/22 220 lb (99.8 kg)  11/10/22 220 lb (99.8 kg)   Body mass index is 39.04 kg/m.   Not  able to able to exercise , states that her balance is worse.  Does intermittent fasting every other day . We discussed referral to medical weight management clinic Patient counseled on low-carb modified diet

## 2023-02-16 NOTE — Assessment & Plan Note (Signed)
Not taking iron supplements Does not want medications I recommended increasing intake of foods rich in iron Lab Results  Component Value Date   IRON 24 (L) 08/18/2022   TIBC 310 08/18/2022   FERRITIN 43 08/18/2022

## 2023-02-16 NOTE — Assessment & Plan Note (Addendum)
Pregnancy test negative - norethindrone (MICRONOR) 0.35 MG tablet; Take 1 tablet (0.35 mg total) by mouth daily.  Dispense: 28 tablet; Refill: 11   Side effects of medication reviewed   Initiation of therapy discussed with the patient: May be started at any time in the menstrual cycle once it is determined that the patient is not pregnant. Back-up contraception is not needed if started within 5 days of onset of menstruation. If started >5 days after the onset of menstruation or at any time in a patient experiencing amenorrhea (not postpartum), back up contraception should be used for 2 days.

## 2023-02-16 NOTE — Progress Notes (Signed)
Established Patient Office Visit  Subjective:  Patient ID: Teresa Arnold, female    DOB: 1976/08/30  Age: 47 y.o. MRN: [id number]  CC:  Chief Complaint  Patient presents with   Follow-up    HPI Teresa Arnold is a 47 y.o. female  has a past medical history of Bronchitis, Chronic insomnia (05/22/2017), GERD (gastroesophageal reflux disease), Iron deficiency anemia (05/22/2017), Morbid obesity (HCC) (05/22/2017), MS (multiple sclerosis) (HCC), Vitamin B12 deficiency (11/2019), and Vitamin D deficiency (11/2019).   Patient presents to discuss contraceptive management.  She is currently taking Mayzent for MS. this medication is contraindicated with pregnancy as the need for contraception.  She has used contraceptive pills in the past and Nexplanon implant.  She does not want injectable contraceptive or IUD.  She does not smoke cigarettes, has no previous history of DVT.   She declined labs to check her vitamin D and iron levels.  Currently not taking vitamin D or iron, states that her labs will always be low anyways.      Past Medical History:  Diagnosis Date   Bronchitis    Chronic insomnia 05/22/2017   GERD (gastroesophageal reflux disease)    Iron deficiency anemia 05/22/2017   Morbid obesity (HCC) 05/22/2017   MS (multiple sclerosis) (HCC)    Vitamin B12 deficiency 11/2019   Vitamin D deficiency 11/2019    Past Surgical History:  Procedure Laterality Date   CESAREAN SECTION  04/26/1996   x4   CESAREAN SECTION  04/09/2007   CESAREAN SECTION  01/20/2009   CESAREAN SECTION  02/19/2010   DILATION AND EVACUATION N/A 07/31/2022   Procedure: DILATATION AND EVACUATION;  Surgeon: Catalina Antigua, MD;  Location: [facility] OR;  Service: Gynecology;  Laterality: N/A;    Family History  Problem Relation Age of Onset   Stroke Father    Stroke Mother    Hypertension Mother    Hypertension Other     Social History   Socioeconomic History   Marital status: Single    Spouse name:  Not on file   Number of children: 4   Years of education: 14   Highest education level: 12th grade  Occupational History   Not on file  Tobacco Use   Smoking status: Never   Smokeless tobacco: Never  Vaping Use   Vaping Use: Never used  Substance and Sexual Activity   Alcohol use: Not Currently    Comment: not in 3 years   Drug use: No   Sexual activity: Yes    Partners: Male    Birth control/protection: None  Other Topics Concern   Not on file  Social History Narrative   Patient is single and lives with her family.   Patient has four children.   Patient has a college education.   Patient is right-handed.   Patient does not drink any caffeine.   Social Determinants of Health   Financial Resource Strain: Low Risk  (02/13/2023)   Overall Financial Resource Strain (CARDIA)    Difficulty of Paying Living Expenses: Not very hard  Food Insecurity: No Food Insecurity (02/13/2023)   Hunger Vital Sign    Worried About Running Out of Food in the Last Year: Never true    Ran Out of Food in the Last Year: Never true  Transportation Needs: No Transportation Needs (02/13/2023)   PRAPARE - Administrator, Civil Service (Medical): No    Lack of Transportation (Non-Medical): No  Physical Activity: Inactive (02/13/2023)   Exercise Vital  Sign    Days of Exercise per Week: 0 days    Minutes of Exercise per Session: 0 min  Stress: No Stress Concern Present (02/13/2023)   [employer] of Occupational Health - Occupational Stress Questionnaire    Feeling of Stress : Only a little  Social Connections: Unknown (02/13/2023)   Social Connection and Isolation Panel [NHANES]    Frequency of Communication with Friends and Family: Never    Frequency of Social Gatherings with Friends and Family: Never    Attends Religious Services: More than 4 times per year    Active Member of [employer] or Organizations: No    Attends Banker Meetings: Never    Marital Status: Patient declined   Recent Concern: Social Connections - Socially Isolated (12/28/2022)   Social Connection and Isolation Panel [NHANES]    Frequency of Communication with Friends and Family: More than three times a week    Frequency of Social Gatherings with Friends and Family: More than three times a week    Attends Religious Services: Never    Database administrator or Organizations: No    Attends Banker Meetings: Never    Marital Status: Never married  Intimate Partner Violence: Not At Risk (12/28/2022)   Humiliation, Afraid, Rape, and Kick questionnaire    Fear of Current or Ex-Partner: No    Emotionally Abused: No    Physically Abused: No    Sexually Abused: No    Outpatient Medications Prior to Visit  Medication Sig Dispense Refill   acetaminophen (TYLENOL) 325 MG tablet Take 650 mg by mouth every 6 (six) hours as needed for moderate pain.      ibuprofen (ADVIL) 600 MG tablet Take 1 tablet (600 mg total) by mouth every 6 (six) hours as needed. 60 tablet 3   pantoprazole (PROTONIX) 40 MG tablet TAKE 1 TABLET BY MOUTH EVERY DAY 30 tablet 10   Siponimod Fumarate (MAYZENT) 2 MG TABS TAKE 1 TABLET (2MG ) BY  MOUTH ONCE DAILY (DISCARD 3 MONTHS AFTER OPENING  BOTTLE) 90 tablet 0   valACYclovir (VALTREX) 1000 MG tablet Take 1 tablet (1,000 mg total) by mouth daily. (Patient taking differently: Take 1,000 mg by mouth daily. Takes as needed for outbreak.) 90 tablet 2   oxyCODONE-acetaminophen (PERCOCET/ROXICET) 5-325 MG tablet Take 1 tablet by mouth every 6 (six) hours as needed. 15 tablet 0   No facility-administered medications prior to visit.    Allergies  Allergen Reactions   Other Shortness Of Breath and Itching    Tampons   Gadolinium Derivatives Nausea And Vomiting    Pt only received half dose of 10ml before getting sick.    Sulfa Antibiotics Itching and Rash   Tysabri [Natalizumab] Rash    ROS Review of Systems  Constitutional:  Negative for activity change, appetite change,  chills, fatigue and fever.  HENT:  Negative for congestion, dental problem, ear discharge, ear pain, hearing loss and tinnitus.   Eyes:  Negative for pain, discharge, redness and itching.  Respiratory:  Negative for cough, chest tightness, shortness of breath and wheezing.   Cardiovascular:  Negative for chest pain, palpitations and leg swelling.  Gastrointestinal:  Negative for abdominal distention, abdominal pain, anal bleeding, blood in stool and constipation.  Endocrine: Negative for cold intolerance, heat intolerance, polydipsia, polyphagia and polyuria.  Genitourinary:  Negative for difficulty urinating, dysuria, flank pain, frequency, hematuria, menstrual problem, pelvic pain and vaginal bleeding.  Musculoskeletal:  Positive for gait problem. Negative for arthralgias, back  pain, joint swelling and myalgias.  Skin:  Negative for color change, pallor, rash and wound.  Neurological:  Negative for tremors, seizures, syncope and speech difficulty.  Hematological:  Negative for adenopathy. Does not bruise/bleed easily.  Psychiatric/Behavioral:  Negative for agitation, behavioral problems, confusion, decreased concentration, hallucinations, self-injury and suicidal ideas.       Objective:    Physical Exam Vitals and nursing note reviewed.  Constitutional:      General: She is not in acute distress.    Appearance: Normal appearance. She is obese. She is not ill-appearing, toxic-appearing or diaphoretic.  HENT:     Mouth/Throat:     Mouth: Mucous membranes are moist.     Pharynx: Oropharynx is clear. No oropharyngeal exudate or posterior oropharyngeal erythema.  Eyes:     General: No scleral icterus.       Right eye: No discharge.        Left eye: No discharge.     Extraocular Movements: Extraocular movements intact.     Conjunctiva/sclera: Conjunctivae normal.  Cardiovascular:     Rate and Rhythm: Normal rate and regular rhythm.     Pulses: Normal pulses.     Heart sounds: Normal  heart sounds. No murmur heard.    No friction rub. No gallop.  Pulmonary:     Effort: Pulmonary effort is normal. No respiratory distress.     Breath sounds: Normal breath sounds. No stridor. No wheezing, rhonchi or rales.  Chest:     Chest wall: No tenderness.  Abdominal:     General: There is no distension.     Palpations: Abdomen is soft.     Tenderness: There is no abdominal tenderness. There is no right CVA tenderness, left CVA tenderness or guarding.  Musculoskeletal:        General: No swelling, tenderness, deformity or signs of injury.     Right lower leg: No edema.     Left lower leg: No edema.  Skin:    General: Skin is warm and dry.     Capillary Refill: Capillary refill takes 2 to 3 seconds.     Coloration: Skin is not jaundiced or pale.     Findings: No bruising, erythema or lesion.  Neurological:     Mental Status: She is alert and oriented to person, place, and time. Mental status is at baseline.     Coordination: Coordination normal.     Gait: Gait abnormal.     Comments: Limp on right side due to MS.   Psychiatric:        Mood and Affect: Mood normal.        Behavior: Behavior normal.        Thought Content: Thought content normal.        Judgment: Judgment normal.     BP 108/61   Pulse 66   Temp 97.7 F (36.5 C)   Ht 5\' 3"  (1.6 m)   Wt 220 lb 6.4 oz (100 kg)   LMP 02/09/2023 (Approximate)   BMI 39.04 kg/m  Wt Readings from Last 3 Encounters:  02/16/23 220 lb 6.4 oz (100 kg)  12/28/22 220 lb (99.8 kg)  11/10/22 220 lb (99.8 kg)    Lab Results  Component Value Date   TSH 0.774 07/10/2022   Lab Results  Component Value Date   WBC 4.1 11/07/2022   HGB 10.9 (L) 11/07/2022   HCT 34.1 11/07/2022   MCV 80 11/07/2022   PLT 324 11/07/2022   Lab Results  Component Value Date   NA 145 (H) 11/07/2022   K 4.5 11/07/2022   CO2 23 11/07/2022   GLUCOSE 85 11/07/2022   BUN 18 11/07/2022   CREATININE 0.68 11/07/2022   BILITOT 0.3 11/07/2022    ALKPHOS 74 11/07/2022   AST 14 11/07/2022   ALT 10 11/07/2022   PROT 7.2 11/07/2022   ALBUMIN 4.3 11/07/2022   CALCIUM 9.2 11/07/2022   ANIONGAP 8 11/13/2016   EGFR 110 11/07/2022   Lab Results  Component Value Date   CHOL 159 11/26/2020   Lab Results  Component Value Date   HDL 65 11/26/2020   Lab Results  Component Value Date   LDLCALC 84 11/26/2020   Lab Results  Component Value Date   TRIG 44 11/26/2020   Lab Results  Component Value Date   CHOLHDL 2.4 11/26/2020   Lab Results  Component Value Date   HGBA1C 5.2 05/28/2020   HGBA1C 5.2 05/28/2020   HGBA1C 5.2 (A) 05/28/2020   HGBA1C 5.2 05/28/2020      Assessment & Plan:   Problem List Items Addressed This Visit       Other   Iron deficiency anemia    Not taking iron supplements Does not want medications I recommended increasing intake of foods rich in iron Lab Results  Component Value Date   IRON 24 (L) 08/18/2022   TIBC 310 08/18/2022   FERRITIN 43 08/18/2022         Class 1 obesity due to excess calories with serious comorbidity and body mass index (BMI) of 33.0 to 33.9 in adult - Primary    Wt Readings from Last 3 Encounters:  02/16/23 220 lb 6.4 oz (100 kg)  12/28/22 220 lb (99.8 kg)  11/10/22 220 lb (99.8 kg)   Body mass index is 39.04 kg/m.   Not  able to able to exercise , states that her balance is worse.  Does intermittent fasting every other day . We discussed referral to medical weight management clinic Patient counseled on low-carb modified diet        Relevant Orders   Amb Ref to Medical Weight Management   Vitamin D deficiency    Last vitamin D Lab Results  Component Value Date   VD25OH 14.1 (L) 08/18/2022  Currently not on vitamin D supplement does not want labs rechecked today.  I recommend taking OTC vitamin D 1000 units daily      Encounter for initial prescription of contraceptive pills    Pregnancy test negative - norethindrone (MICRONOR) 0.35 MG tablet; Take 1  tablet (0.35 mg total) by mouth daily.  Dispense: 28 tablet; Refill: 11   Side effects of medication reviewed   Initiation of therapy: May be started at any time in the menstrual cycle once it is determined that the patient is not pregnant. Back-up contraception is not needed if started within 5 days of onset of menstruation. If started >5 days after the onset of menstruation or at any time in a patient experiencing amenorrhea (not postpartum), back up contraception should be used for 2 days.       Relevant Medications   norethindrone (MICRONOR) 0.35 MG tablet   Other Relevant Orders   POCT urine pregnancy (Completed)    Meds ordered this encounter  Medications   norethindrone (MICRONOR) 0.35 MG tablet    Sig: Take 1 tablet (0.35 mg total) by mouth daily.    Dispense:  28 tablet    Refill:  11    Follow-up:  No follow-ups on file.    Donell Beers, FNP

## 2023-02-16 NOTE — Assessment & Plan Note (Signed)
Last vitamin D Lab Results  Component Value Date   VD25OH 14.1 (L) 08/18/2022  Currently not on vitamin D supplement does not want labs rechecked today.  I recommend taking OTC vitamin D 1000 units daily

## 2023-02-16 NOTE — Patient Instructions (Addendum)
1. Class 1 obesity due to excess calories with serious comorbidity and body mass index (BMI) of 33.0 to 33.9 in adult   2. Encounter for initial prescription of contraceptive pills  - norethindrone (MICRONOR) 0.35 MG tablet; Take 1 tablet (0.35 mg total) by mouth daily.  Dispense: 28 tablet; Refill: 11   Scented to (    It is important that you exercise regularly at least 30 minutes 5 times a week as tolerated  Think about what you will eat, plan ahead. Choose " clean, green, fresh or frozen" over canned, processed or packaged foods which are more sugary, salty and fatty. 70 to 75% of food eaten should be vegetables and fruit. Three meals at set times with snacks allowed between meals, but they must be fruit or vegetables. Aim to eat over a 12 hour period , example 7 am to 7 pm, and STOP after  your last meal of the day. Drink water,generally about 64 ounces per day, no other drink is as healthy. Fruit juice is best enjoyed in a healthy way, by EATING the fruit.  Thanks for choosing [facility] we consider it a privelige to serve you.

## 2023-04-22 ENCOUNTER — Other Ambulatory Visit: Payer: Self-pay | Admitting: Neurology

## 2023-05-30 NOTE — Progress Notes (Unsigned)
Patient: Teresa Arnold Date of Birth: [birth year]  Reason for Visit: Follow up History from: Patient Primary Neurologist: Terrace Arabia  ASSESSMENT AND PLAN 47 y.o. year old female   1.  Relapsing remitting multiple sclerosis 2.  Gait abnormality 3.  Headache 4.  History of left-sided optic neuritis  -Concern for right optic neuritis.  Right eye blurry vision for the last 2 weeks.  50% improvement.  Will check routine labs, urinalysis to rule out infection.  If no improvement next week, will order 3 days IV steroid.  Patient reluctant to start steroid right away, has needle fear, noted some improvement in symptoms. Dr. Terrace Arabia saw patient with me today. -Will see if Dr. Dione Booze can see her in the next few days for evaluation -Continue Mayzent  -I have discussed that pregnancy is not recommended while taking Mayzent, reviewed package insert, based on animal studies Mayzent may cause fetal harm, contraception is recommended while taking Mayzent and 10 days after stopping  -Diagnosis 2012 presented with gait abnormality 6 months postpartum, 2017 left optic neuritis flare -On Rebif 2015-2018, good response to IV steroid April 2017 for left optic neuritis flare; had allergic reaction to Tysabri (diffuse body rash); on Mayzent since November 2020 (Stopped in October 2023 due to pregnancy with miscarriage, restarted December 2023)  HISTORY  Teresa Arnold is a 47 year old female, patient of Dr. Anne Hahn, following up for relapsing remitting multiple sclerosis, currently pregnant,   I reviewed and summarized the referring note. PMHx. Relapsing remitting multiple sclerosis Iron deficiency anemia B12 deficiency D deficiency   She has 4 adult children, unexpected pregnancy, due day is in June 2024   She was diagnosed of relapsing remitting multiple sclerosis 6 months postpartum in 2012, presenting with balance difficulty, numbness of bilateral lower extremities   At her worst, she has to use  walker, also developed left visual loss, fortunately symptoms has greatly recovered following IV Solu-Medrol treatment   She was treated with lipid since diagnosis until 2018, had allergic reaction to Silvestre Moment in the past diffuse body rash, has been treated with Mayzent since 2018, tolerating it well, no significant flareup   Last reported flareup once in 2018 with left optic neuritis  Update November 07, 2022 SS: Had a miscarriage with D&E 07/31/22, restarted Mayzent in December 2023.  Feels MS is overall stable.  Continues with chronic weakness to right leg, blurry vision to the left eye.  Her period is 1 week late, since pregnancy?  She has 2 children, youngest is 58.  Is not on birth control.  Labs 07/10/22 Hgb 10.0, absolute lymphocyte 0.2, JCV positive. 08/18/22 vitamin D 14.1 treated with prescription strength. Headaches are doing well.   Update May 31, 2023 SS: MRI brain in March 2024 continues to show multiple MS lesions within the brain, no new lesions compared to October 2021.  MRI cervical spine showed stable C-spine lesions, no new ones compared to June 2021.  Absolute lymphocyte 0.2. She never started birth control. Reports few weeks ago noted blurry vision to right eye, lasted 2 weeks, is improving, 50% back to normal, no pain to the eye historically left eye has had optic neuritis. Has been taking Mayzent.   REVIEW OF SYSTEMS: Out of a complete 14 system review of symptoms, the patient complains only of the following symptoms, and all other reviewed systems are negative.  See HPI  ALLERGIES: Allergies  Allergen Reactions   Other Shortness Of Breath and Itching    Tampons   Gadolinium  Derivatives Nausea And Vomiting    Pt only received half dose of 10ml before getting sick.    Sulfa Antibiotics Itching and Rash   Tysabri [Natalizumab] Rash    HOME MEDICATIONS: Outpatient Medications Prior to Visit  Medication Sig Dispense Refill   acetaminophen (TYLENOL) 325 MG tablet  Take 650 mg by mouth every 6 (six) hours as needed for moderate pain.      pantoprazole (PROTONIX) 40 MG tablet TAKE 1 TABLET BY MOUTH EVERY DAY 30 tablet 10   Siponimod Fumarate (MAYZENT) 2 MG TABS TAKE 1 TABLET BY MOUTH DAILY  (DISCARD 3 MONTHS AFTER OPENING  BOTTLE) 30 tablet 6   valACYclovir (VALTREX) 1000 MG tablet Take 1 tablet (1,000 mg total) by mouth daily. (Patient taking differently: Take 1,000 mg by mouth daily. Takes as needed for outbreak.) 90 tablet 2   ibuprofen (ADVIL) 600 MG tablet Take 1 tablet (600 mg total) by mouth every 6 (six) hours as needed. 60 tablet 3   norethindrone (MICRONOR) 0.35 MG tablet Take 1 tablet (0.35 mg total) by mouth daily. 28 tablet 11   oxyCODONE-acetaminophen (PERCOCET/ROXICET) 5-325 MG tablet Take 1 tablet by mouth every 6 (six) hours as needed. 15 tablet 0   No facility-administered medications prior to visit.    PAST MEDICAL HISTORY: Past Medical History:  Diagnosis Date   Bronchitis    Chronic insomnia 05/22/2017   GERD (gastroesophageal reflux disease)    Iron deficiency anemia 05/22/2017   Morbid obesity (HCC) 05/22/2017   MS (multiple sclerosis) (HCC)    Vitamin B12 deficiency 11/2019   Vitamin D deficiency 11/2019    PAST SURGICAL HISTORY: Past Surgical History:  Procedure Laterality Date   CESAREAN SECTION  04/26/1996   x4   CESAREAN SECTION  04/09/2007   CESAREAN SECTION  01/20/2009   CESAREAN SECTION  02/19/2010   DILATION AND EVACUATION N/A 07/31/2022   Procedure: DILATATION AND EVACUATION;  Surgeon: Catalina Antigua, MD;  Location: [facility] OR;  Service: Gynecology;  Laterality: N/A;    FAMILY HISTORY: Family History  Problem Relation Age of Onset   Stroke Father    Stroke Mother    Hypertension Mother    Hypertension Other     SOCIAL HISTORY: Social History   Socioeconomic History   Marital status: Single    Spouse name: Not on file   Number of children: 4   Years of education: 14   Highest education level: 12th  grade  Occupational History   Not on file  Tobacco Use   Smoking status: Never   Smokeless tobacco: Never  Vaping Use   Vaping status: Never Used  Substance and Sexual Activity   Alcohol use: Not Currently    Comment: not in 3 years   Drug use: No   Sexual activity: Yes    Partners: Male    Birth control/protection: None  Other Topics Concern   Not on file  Social History Narrative   Patient is single and lives with her family.   Patient has four children.   Patient has a college education.   Patient is right-handed.   Patient does not drink any caffeine.   Social Determinants of Health   Financial Resource Strain: Low Risk  (02/13/2023)   Overall Financial Resource Strain (CARDIA)    Difficulty of Paying Living Expenses: Not very hard  Food Insecurity: No Food Insecurity (02/13/2023)   Hunger Vital Sign    Worried About Running Out of Food in the Last Year: Never true  Ran Out of Food in the Last Year: Never true  Transportation Needs: No Transportation Needs (02/13/2023)   PRAPARE - Administrator, Civil Service (Medical): No    Lack of Transportation (Non-Medical): No  Physical Activity: Inactive (02/13/2023)   Exercise Vital Sign    Days of Exercise per Week: 0 days    Minutes of Exercise per Session: 0 min  Stress: No Stress Concern Present (02/13/2023)   [employer] of Occupational Health - Occupational Stress Questionnaire    Feeling of Stress : Only a little  Social Connections: Unknown (02/13/2023)   Social Connection and Isolation Panel [NHANES]    Frequency of Communication with Friends and Family: Never    Frequency of Social Gatherings with Friends and Family: Never    Attends Religious Services: More than 4 times per year    Active Member of [employer] or Organizations: No    Attends Banker Meetings: Never    Marital Status: Patient declined  Recent Concern: Social Connections - Socially Isolated (12/28/2022)   Social Connection and  Isolation Panel [NHANES]    Frequency of Communication with Friends and Family: More than three times a week    Frequency of Social Gatherings with Friends and Family: More than three times a week    Attends Religious Services: Never    Database administrator or Organizations: No    Attends Banker Meetings: Never    Marital Status: Never married  Intimate Partner Violence: Not At Risk (12/28/2022)   Humiliation, Afraid, Rape, and Kick questionnaire    Fear of Current or Ex-Partner: No    Emotionally Abused: No    Physically Abused: No    Sexually Abused: No   PHYSICAL EXAM  Vitals:   05/31/23 0945  BP: 122/74  Pulse: 63  Weight: 223 lb (101.2 kg)  Height: 5\' 4"  (1.626 m)   Body mass index is 38.28 kg/m.  Generalized: Well developed, in no acute distress  Neurological examination  Mentation: Alert oriented to time, place, history taking. Follows all commands speech and language fluent Cranial nerve II-XII: Pupils were equal round reactive to light. Extraocular movements were full, visual field were full on confrontational test. Right Eye 20/40, left eye 20/50. No color wash out.  No significant eye pain. Facial sensation and strength were normal.  Head turning and shoulder shrug were normal and symmetric. Motor: Right leg 3/5, right arm 4.5/5, left side is normal Sensory: Sensory testing is intact to soft touch on all 4 extremities. No evidence of extinction is noted.  Coordination: Cerebellar testing reveals good finger-nose-finger and heel-to-shin bilaterally.  Gait and station: Gait is wide-based, limp on the right Reflexes: Deep tendon reflexes are symmetric but increased to the right side  DIAGNOSTIC DATA (LABS, IMAGING, TESTING) - I reviewed patient records, labs, notes, testing and imaging myself where available.  Lab Results  Component Value Date   WBC 4.1 11/07/2022   HGB 10.9 (L) 11/07/2022   HCT 34.1 11/07/2022   MCV 80 11/07/2022   PLT 324  11/07/2022      Component Value Date/Time   NA 145 (H) 11/07/2022 1048   K 4.5 11/07/2022 1048   CL 104 11/07/2022 1048   CO2 23 11/07/2022 1048   GLUCOSE 85 11/07/2022 1048   GLUCOSE 86 05/21/2017 1142   BUN 18 11/07/2022 1048   CREATININE 0.68 11/07/2022 1048   CREATININE 0.67 05/21/2017 1142   CALCIUM 9.2 11/07/2022 1048   PROT 7.2  11/07/2022 1048   ALBUMIN 4.3 11/07/2022 1048   AST 14 11/07/2022 1048   ALT 10 11/07/2022 1048   ALKPHOS 74 11/07/2022 1048   BILITOT 0.3 11/07/2022 1048   GFRNONAA 110 06/08/2020 1018   GFRNONAA 111 05/21/2017 1142   GFRAA 127 06/08/2020 1018   GFRAA 128 05/21/2017 1142   Lab Results  Component Value Date   CHOL 159 11/26/2020   HDL 65 11/26/2020   LDLCALC 84 11/26/2020   TRIG 44 11/26/2020   CHOLHDL 2.4 11/26/2020   Lab Results  Component Value Date   HGBA1C 5.2 05/28/2020   HGBA1C 5.2 05/28/2020   HGBA1C 5.2 (A) 05/28/2020   HGBA1C 5.2 05/28/2020   Lab Results  Component Value Date   VITAMINB12 387 11/26/2020   Lab Results  Component Value Date   TSH 0.774 07/10/2022    Margie Ege, AGNP-C, DNP 05/31/2023, 10:31 AM [facility] [address], [address] [place], [place] [address] [phone]

## 2023-05-31 ENCOUNTER — Telehealth: Payer: Self-pay | Admitting: Neurology

## 2023-05-31 ENCOUNTER — Encounter: Payer: Self-pay | Admitting: Neurology

## 2023-05-31 ENCOUNTER — Ambulatory Visit (INDEPENDENT_AMBULATORY_CARE_PROVIDER_SITE_OTHER): Payer: 59 | Admitting: Neurology

## 2023-05-31 VITALS — BP 122/74 | HR 63 | Ht 64.0 in | Wt 223.0 lb

## 2023-05-31 DIAGNOSIS — G35 Multiple sclerosis: Secondary | ICD-10-CM | POA: Diagnosis not present

## 2023-05-31 DIAGNOSIS — R269 Unspecified abnormalities of gait and mobility: Secondary | ICD-10-CM

## 2023-05-31 NOTE — Telephone Encounter (Signed)
Urgent referral sent to [facility], phone # [phone].

## 2023-05-31 NOTE — Patient Instructions (Addendum)
Check labs today  Will see if you can see Dr Dione Booze today or tomorrow for your eye  Continue medications  Call next Monday if symptoms of right vision change

## 2023-05-31 NOTE — Telephone Encounter (Signed)
I placed urgent referral to see Dr. Dione Booze. 2 weeks blurry vision to right eye, with MS, history of left optic neuritis. R/o any visual issues with right eye. Can she be seen today or tomorrow or 1st thing next week? Thanks

## 2023-06-01 LAB — COMPREHENSIVE METABOLIC PANEL
ALT: 10 IU/L (ref 0–32)
AST: 12 IU/L (ref 0–40)
Albumin: 4.3 g/dL (ref 3.9–4.9)
Alkaline Phosphatase: 79 IU/L (ref 44–121)
BUN/Creatinine Ratio: 14 (ref 9–23)
BUN: 10 mg/dL (ref 6–24)
Bilirubin Total: 0.3 mg/dL (ref 0.0–1.2)
CO2: 21 mmol/L (ref 20–29)
Calcium: 9.2 mg/dL (ref 8.7–10.2)
Chloride: 105 mmol/L (ref 96–106)
Creatinine, Ser: 0.71 mg/dL (ref 0.57–1.00)
Globulin, Total: 2.5 g/dL (ref 1.5–4.5)
Glucose: 83 mg/dL (ref 70–99)
Potassium: 4.1 mmol/L (ref 3.5–5.2)
Sodium: 140 mmol/L (ref 134–144)
Total Protein: 6.8 g/dL (ref 6.0–8.5)
eGFR: 107 mL/min/{1.73_m2} (ref >59)

## 2023-06-01 LAB — CBC WITH DIFFERENTIAL/PLATELET
Basophils Absolute: 0 10*3/uL (ref 0.0–0.2)
Basos: 0 %
EOS (ABSOLUTE): 0 10*3/uL (ref 0.0–0.4)
Eos: 1 %
Hematocrit: 34.5 % (ref 34.0–46.6)
Hemoglobin: 10.8 g/dL — ABNORMAL LOW (ref 11.1–15.9)
Immature Grans (Abs): 0 10*3/uL (ref 0.0–0.1)
Immature Granulocytes: 1 %
Lymphocytes Absolute: 0.2 10*3/uL — ABNORMAL LOW (ref 0.7–3.1)
Lymphs: 6 %
MCH: 25.4 pg — ABNORMAL LOW (ref 26.6–33.0)
MCHC: 31.3 g/dL — ABNORMAL LOW (ref 31.5–35.7)
MCV: 81 fL (ref 79–97)
Monocytes Absolute: 0.3 10*3/uL (ref 0.1–0.9)
Monocytes: 9 %
Neutrophils Absolute: 2.8 10*3/uL (ref 1.4–7.0)
Neutrophils: 83 %
Platelets: 369 10*3/uL (ref 150–450)
RBC: 4.25 x10E6/uL (ref 3.77–5.28)
RDW: 12.7 % (ref 11.7–15.4)
WBC: 3.4 10*3/uL (ref 3.4–10.8)

## 2023-06-01 LAB — PREGNANCY, URINE: Preg Test, Ur: NEGATIVE

## 2023-06-01 LAB — URINALYSIS
Bilirubin, UA: NEGATIVE
Glucose, UA: NEGATIVE
Ketones, UA: NEGATIVE
Leukocytes,UA: NEGATIVE
Nitrite, UA: NEGATIVE
Protein,UA: NEGATIVE
RBC, UA: NEGATIVE
Specific Gravity, UA: 1.021 (ref 1.005–1.030)
Urobilinogen, Ur: 1 mg/dL (ref 0.2–1.0)
pH, UA: 6 (ref 5.0–7.5)

## 2023-06-01 LAB — HEMOGLOBIN A1C
Est. average glucose Bld gHb Est-mCnc: 108 mg/dL
Hgb A1c MFr Bld: 5.4 % (ref 4.8–5.6)

## 2023-06-01 LAB — TSH: TSH: 1.22 u[IU]/mL (ref 0.450–4.500)

## 2023-06-04 ENCOUNTER — Telehealth: Payer: Self-pay | Admitting: Neurology

## 2023-06-04 NOTE — Telephone Encounter (Signed)
[facility]fusion [facility], states they just got their computers working again and will most likely schedule her in the morning. Will check with them at that time.

## 2023-06-04 NOTE — Telephone Encounter (Signed)
Please call the patient, blood work is overall unremarkable.  Baseline absolute lymphocyte count 0.2, Hgb 10.8.  A1c is normal at 5.4, TSH normal 1.220.  Urine negative for infection.  Negative pregnancy test.  Please check on how her right eye vision issues are doing.  Does she feel she needs to come today for 3 days of IV steroid? We can offer if no improvement in the blurry vision, on Friday it was going on for 2 weeks and was 50% better. Was she able to get in to see Dr. Dione Booze? Nothing in labs to explain vision. Thanks

## 2023-06-04 NOTE — Telephone Encounter (Signed)
Call to patient, no answer. Left message to call office back and also stated I would send a Mychart message.

## 2023-06-04 NOTE — Telephone Encounter (Signed)
Stable lymphocyte, ok to keep mayzent, and proceed with IV solumedrol 1000mg  iv daily x 3 days.

## 2023-06-12 ENCOUNTER — Telehealth: Payer: Self-pay | Admitting: Neurology

## 2023-06-12 DIAGNOSIS — G35 Multiple sclerosis: Secondary | ICD-10-CM

## 2023-06-12 NOTE — Telephone Encounter (Signed)
No needs for MRI orbit

## 2023-06-12 NOTE — Telephone Encounter (Signed)
I received office notes from Dr. Dione Booze from 06/06/2023.  Felt definite retrobulbar neuritis.  Last week she had 3 days IV steroid.  I called the patient.  Her right eye vision is nearly back to normal.  Will order MRI of the brain to evaluate for any new lesions.  Have her get scheduled to come in with Dr. Terrace Arabia to discuss MS treatment options given relapse, potential switch to Ocrevus?  Orders Placed This Encounter  Procedures   MR BRAIN WO CONTRAST

## 2023-06-14 ENCOUNTER — Telehealth: Payer: Self-pay | Admitting: Neurology

## 2023-06-14 NOTE — Telephone Encounter (Signed)
[employer] medicare/[employer]or medicaid NPR sent to [facility] [phone] 

## 2023-07-08 ENCOUNTER — Ambulatory Visit
Admission: RE | Admit: 2023-07-08 | Discharge: 2023-07-08 | Disposition: A | Discharge status: Home / Self Care | Payer: 59 | Source: Ambulatory Visit | Attending: Neurology | Admitting: Neurology

## 2023-07-08 DIAGNOSIS — G35 Multiple sclerosis: Secondary | ICD-10-CM

## 2023-07-09 ENCOUNTER — Telehealth: Payer: Self-pay | Admitting: Neurology

## 2023-07-09 NOTE — Telephone Encounter (Signed)
Ok double book with Wed injection slot

## 2023-07-09 NOTE — Telephone Encounter (Signed)
Scheduled 11.06 at 11 am

## 2023-07-09 NOTE — Telephone Encounter (Signed)
Can we get her scheduled with Dr. Terrace Arabia to discuss switching MS treatment?  I sent her a MyChart message to check on her.  Also MRI of the brain resulted and appears stable.  No new lesions were seen.

## 2023-07-10 DIAGNOSIS — G35 Multiple sclerosis: Secondary | ICD-10-CM

## 2023-07-18 ENCOUNTER — Ambulatory Visit (INDEPENDENT_AMBULATORY_CARE_PROVIDER_SITE_OTHER): Payer: 59 | Admitting: Neurology

## 2023-07-18 VITALS — BP 114/76 | HR 78 | Ht 64.0 in | Wt 221.6 lb

## 2023-07-18 DIAGNOSIS — R269 Unspecified abnormalities of gait and mobility: Secondary | ICD-10-CM

## 2023-07-18 DIAGNOSIS — H469 Unspecified optic neuritis: Secondary | ICD-10-CM | POA: Diagnosis not present

## 2023-07-18 DIAGNOSIS — G35 Multiple sclerosis: Secondary | ICD-10-CM

## 2023-07-18 NOTE — Progress Notes (Addendum)
ASSESSMENT AND PLAN 47 y.o. year old female    Relapsing remitting multiple sclerosis  Gait abnormality  Headache  History of left-sided optic neuritis  Diagnosis 2012 presented with gait abnormality 6 months postpartum, 2017 left optic neuritis flare On Rebif 2015-2018, good response to IV steroid April 2017 for left optic neuritis flare; had allergic reaction to Tysabri (diffuse body rash);  on Mayzent since November 2020 (Stopped in October 2023 due to pregnancy with miscarriage, restarted December 2023) Right optic neuritis in September 2024 while taking Mayzent,  Repeat MRI of the brain with without contrast July 08, 2023, moderate lesion load, no contrast-enhancement,  Evidence of cervical spinal cord involvement based on previous MRI  I had extensive discussion with patient about treatment options, because he has recurrent right optic neuritis, may consider switch to stronger immunomodulation therapy, discussed option of Mavenclad, ocrelizumab, Kemsimpta, she will discuss with her husband, call back our office once decision is made  Data and Record Review: Ophthalmologist evaluation by Dr. Lyn Records June 06, 2023: Normal macular, good foveal reflex, visual fields full OU, RNFL thinning with complete loss of ganglionic cell layer OU, OCT indicating she has actually had optic neuritis OU,  In September 2024, she complains of reduced visual field, eye pain with eye movement on the right side, suggestive retrobulbar optic neuritis OD, was treated with IV Solu-Medrol, which did improve her vision   HISTORY  Teresa Arnold is a 47 year old female, patient of Dr. Anne Hahn, following up for relapsing remitting multiple sclerosis, currently pregnant,   I reviewed and summarized the referring note. PMHx. Relapsing remitting multiple sclerosis Iron deficiency anemia B12 deficiency D deficiency   She has 4 adult children, unexpected pregnancy, due day is in June 2024    She was diagnosed of relapsing remitting multiple sclerosis 6 months postpartum in 2012, presenting with balance difficulty, numbness of bilateral lower extremities   At her worst, she has to use walker, also developed left visual loss, fortunately symptoms has greatly recovered following IV Solu-Medrol treatment   She was treated with lipid since diagnosis until 2018, had allergic reaction to Silvestre Moment in the past diffuse body rash, has been treated with Mayzent since 2018, tolerating it well, no significant flareup   Last reported flareup once in 2018 with left optic neuritis  Had a miscarriage with D&E 07/31/22, restarted Mayzent in December 2023.    JCV positive. 08/18/22, titer 1.62 in Oct 2023.  vitamin D 14.1 treated with prescription strength.    MRI brain in March 2024 continues to show multiple MS lesions within the brain, no new lesions compared to October 2021.  MRI cervical spine showed stable C-spine lesions, no new ones compared to June 2021.    She began to complain of blurry vision since September 2024, was seen by ophthalmologist, concern for retrobulbar right optic neuritis, received IV Solu-Medrol treatment, did have some improvement, over the years noticed slow worsening gait abnormality, dragging right leg,  .   PHYSICAL EXAM  Vitals:   07/18/23 1116  BP: 114/76  Pulse: 78  Weight: 221 lb 9.6 oz (100.5 kg)  Height: 5\' 4"  (1.626 m)   Body mass index is 38.04 kg/m.    PHYSICAL EXAMNIATION:  Gen: NAD, conversant, well nourised, well groomed                     Cardiovascular: Regular rate rhythm, no peripheral edema, warm, nontender. Eyes: Conjunctivae clear without exudates or hemorrhage Neck:  Supple, no carotid bruits. Pulmonary: Clear to auscultation bilaterally   NEUROLOGICAL EXAM:  MENTAL STATUS: Speech/cognition: Awake, alert oriented to history taking and casual conversation  CRANIAL NERVES: CN II: Visual fields are full to confrontation.  Pupils  are round equal and briskly reactive to light. OD 20/40, OS 20/50 CN III, IV, VI: extraocular movement are normal. No ptosis. CN V: Facial sensation is intact to pinprick in all 3 divisions bilaterally. Corneal responses are intact.  CN VII: Face is symmetric with normal eye closure and smile. CN VIII: Hearing is normal to casual conversation CN IX, X: Palate elevates symmetrically. Phonation is normal. CN XI: Head turning and shoulder shrug are intact CN XII: Tongue is midline with normal movements and no atrophy.  MOTOR: There is no pronator drift of out-stretched arms. Muscle bulk and tone are normal. Muscle strength is normal.  REFLEXES: Reflexes are 2+ and symmetric at the biceps, triceps, knees, and ankles. Plantar responses are flexor.  SENSORY: Intact to light touch, pinprick, positional and vibratory sensation are intact in fingers and toes.  COORDINATION: Rapid alternating movements and fine finger movements are intact. There is no dysmetria on finger-to-nose and heel-knee-shin.    GAIT/STANCE: push up, dragging right leg, mildly unsteady    REVIEW OF SYSTEMS: Out of a complete 14 system review of symptoms, the patient complains only of the following symptoms, and all other reviewed systems are negative.  See HPI  ALLERGIES: Allergies  Allergen Reactions   Other Shortness Of Breath and Itching    Tampons   Gadolinium Derivatives Nausea And Vomiting    Pt only received half dose of 10ml before getting sick.    Sulfa Antibiotics Itching and Rash   Tysabri [Natalizumab] Rash    HOME MEDICATIONS: Outpatient Medications Prior to Visit  Medication Sig Dispense Refill   acetaminophen (TYLENOL) 325 MG tablet Take 650 mg by mouth every 6 (six) hours as needed for moderate pain.      pantoprazole (PROTONIX) 40 MG tablet TAKE 1 TABLET BY MOUTH EVERY DAY 30 tablet 10   Siponimod Fumarate (MAYZENT) 2 MG TABS TAKE 1 TABLET BY MOUTH DAILY  (DISCARD 3 MONTHS AFTER OPENING   BOTTLE) 30 tablet 6   valACYclovir (VALTREX) 1000 MG tablet Take 1 tablet (1,000 mg total) by mouth daily. (Patient taking differently: Take 1,000 mg by mouth daily. Takes as needed for outbreak.) 90 tablet 2   ibuprofen (ADVIL) 600 MG tablet Take 1 tablet (600 mg total) by mouth every 6 (six) hours as needed. 60 tablet 3   norethindrone (MICRONOR) 0.35 MG tablet Take 1 tablet (0.35 mg total) by mouth daily. 28 tablet 11   oxyCODONE-acetaminophen (PERCOCET/ROXICET) 5-325 MG tablet Take 1 tablet by mouth every 6 (six) hours as needed. 15 tablet 0   No facility-administered medications prior to visit.    PAST MEDICAL HISTORY: Past Medical History:  Diagnosis Date   Bronchitis    Chronic insomnia 05/22/2017   GERD (gastroesophageal reflux disease)    Iron deficiency anemia 05/22/2017   Morbid obesity (HCC) 05/22/2017   MS (multiple sclerosis) (HCC)    Vitamin B12 deficiency 11/2019   Vitamin D deficiency 11/2019    PAST SURGICAL HISTORY: Past Surgical History:  Procedure Laterality Date   CESAREAN SECTION  04/26/1996   x4   CESAREAN SECTION  04/09/2007   CESAREAN SECTION  01/20/2009   CESAREAN SECTION  02/19/2010   DILATION AND EVACUATION N/A 07/31/2022   Procedure: DILATATION AND EVACUATION;  Surgeon: Catalina Antigua,  MD;  Location: [facility] OR;  Service: Gynecology;  Laterality: N/A;    FAMILY HISTORY: Family History  Problem Relation Age of Onset   Stroke Father    Stroke Mother    Hypertension Mother    Hypertension Other     SOCIAL HISTORY: Social History   Socioeconomic History   Marital status: Single    Spouse name: Not on file   Number of children: 4   Years of education: 14   Highest education level: 12th grade  Occupational History   Not on file  Tobacco Use   Smoking status: Never   Smokeless tobacco: Never  Vaping Use   Vaping status: Never Used  Substance and Sexual Activity   Alcohol use: Not Currently    Comment: not in 3 years   Drug use: No    Sexual activity: Yes    Partners: Male    Birth control/protection: None  Other Topics Concern   Not on file  Social History Narrative   Patient is single and lives with her family.   Patient has four children.   Patient has a college education.   Patient is right-handed.   Patient does not drink any caffeine.   Social Determinants of Health   Financial Resource Strain: Low Risk  (02/13/2023)   Overall Financial Resource Strain (CARDIA)    Difficulty of Paying Living Expenses: Not very hard  Food Insecurity: No Food Insecurity (02/13/2023)   Hunger Vital Sign    Worried About Running Out of Food in the Last Year: Never true    Ran Out of Food in the Last Year: Never true  Transportation Needs: No Transportation Needs (02/13/2023)   PRAPARE - Administrator, Civil Service (Medical): No    Lack of Transportation (Non-Medical): No  Physical Activity: Inactive (02/13/2023)   Exercise Vital Sign    Days of Exercise per Week: 0 days    Minutes of Exercise per Session: 0 min  Stress: No Stress Concern Present (02/13/2023)   [employer] of Occupational Health - Occupational Stress Questionnaire    Feeling of Stress : Only a little  Social Connections: Unknown (02/13/2023)   Social Connection and Isolation Panel [NHANES]    Frequency of Communication with Friends and Family: Never    Frequency of Social Gatherings with Friends and Family: Never    Attends Religious Services: More than 4 times per year    Active Member of [employer] or Organizations: No    Attends Banker Meetings: Never    Marital Status: Patient declined  Recent Concern: Social Connections - Socially Isolated (12/28/2022)   Social Connection and Isolation Panel [NHANES]    Frequency of Communication with Friends and Family: More than three times a week    Frequency of Social Gatherings with Friends and Family: More than three times a week    Attends Religious Services: Never    Database administrator  or Organizations: No    Attends Banker Meetings: Never    Marital Status: Never married  Intimate Partner Violence: Not At Risk (12/28/2022)   Humiliation, Afraid, Rape, and Kick questionnaire    Fear of Current or Ex-Partner: No    Emotionally Abused: No    Physically Abused: No    Sexually Abused: No     DIAGNOSTIC DATA (LABS, IMAGING, TESTING) - I reviewed patient records, labs, notes, testing and imaging myself where available.  Lab Results  Component Value Date   WBC 3.4  05/31/2023   HGB 10.8 (L) 05/31/2023   HCT 34.5 05/31/2023   MCV 81 05/31/2023   PLT 369 05/31/2023      Component Value Date/Time   NA 140 05/31/2023 1033   K 4.1 05/31/2023 1033   CL 105 05/31/2023 1033   CO2 21 05/31/2023 1033   GLUCOSE 83 05/31/2023 1033   GLUCOSE 86 05/21/2017 1142   BUN 10 05/31/2023 1033   CREATININE 0.71 05/31/2023 1033   CREATININE 0.67 05/21/2017 1142   CALCIUM 9.2 05/31/2023 1033   PROT 6.8 05/31/2023 1033   ALBUMIN 4.3 05/31/2023 1033   AST 12 05/31/2023 1033   ALT 10 05/31/2023 1033   ALKPHOS 79 05/31/2023 1033   BILITOT 0.3 05/31/2023 1033   GFRNONAA 110 06/08/2020 1018   GFRNONAA 111 05/21/2017 1142   GFRAA 127 06/08/2020 1018   GFRAA 128 05/21/2017 1142   Lab Results  Component Value Date   CHOL 159 11/26/2020   HDL 65 11/26/2020   LDLCALC 84 11/26/2020   TRIG 44 11/26/2020   CHOLHDL 2.4 11/26/2020   Lab Results  Component Value Date   HGBA1C 5.4 05/31/2023   Lab Results  Component Value Date   VITAMINB12 387 11/26/2020   Lab Results  Component Value Date   TSH 1.220 05/31/2023    Levert Feinstein, M.D. Ph.D.  [facility] [address] [place], [place] [address] Phone: [phone] Fax:      [phone]

## 2023-07-18 NOTE — Patient Instructions (Addendum)
Kesimpta  Ocrelizumab  Mavenclad  [email or web address]  [email or web address]  [email or web address]

## 2023-07-24 ENCOUNTER — Encounter: Payer: Self-pay | Admitting: Neurology

## 2023-07-24 DIAGNOSIS — G35 Multiple sclerosis: Secondary | ICD-10-CM

## 2023-10-06 IMAGING — MG MM DIGITAL DIAGNOSTIC UNILAT*R* W/ TOMO W/ CAD
4 series · 4 of 12 positions shown · non-contrast
Comparison: Previous exam(s).

CLINICAL DATA: Recall from screening mammography, possible mass
involving the inner RIGHT breast at middle depth.

EXAM:
DIGITAL DIAGNOSTIC UNILATERAL RIGHT MAMMOGRAM WITH TOMOSYNTHESIS AND
CAD; ULTRASOUND RIGHT BREAST LIMITED
TECHNIQUE: Right digital diagnostic mammography and breast tomosynthesis was
performed. The images were evaluated with computer-aided detection.;
Targeted ultrasound examination of the right breast was performed

[R MLO synth-2D]
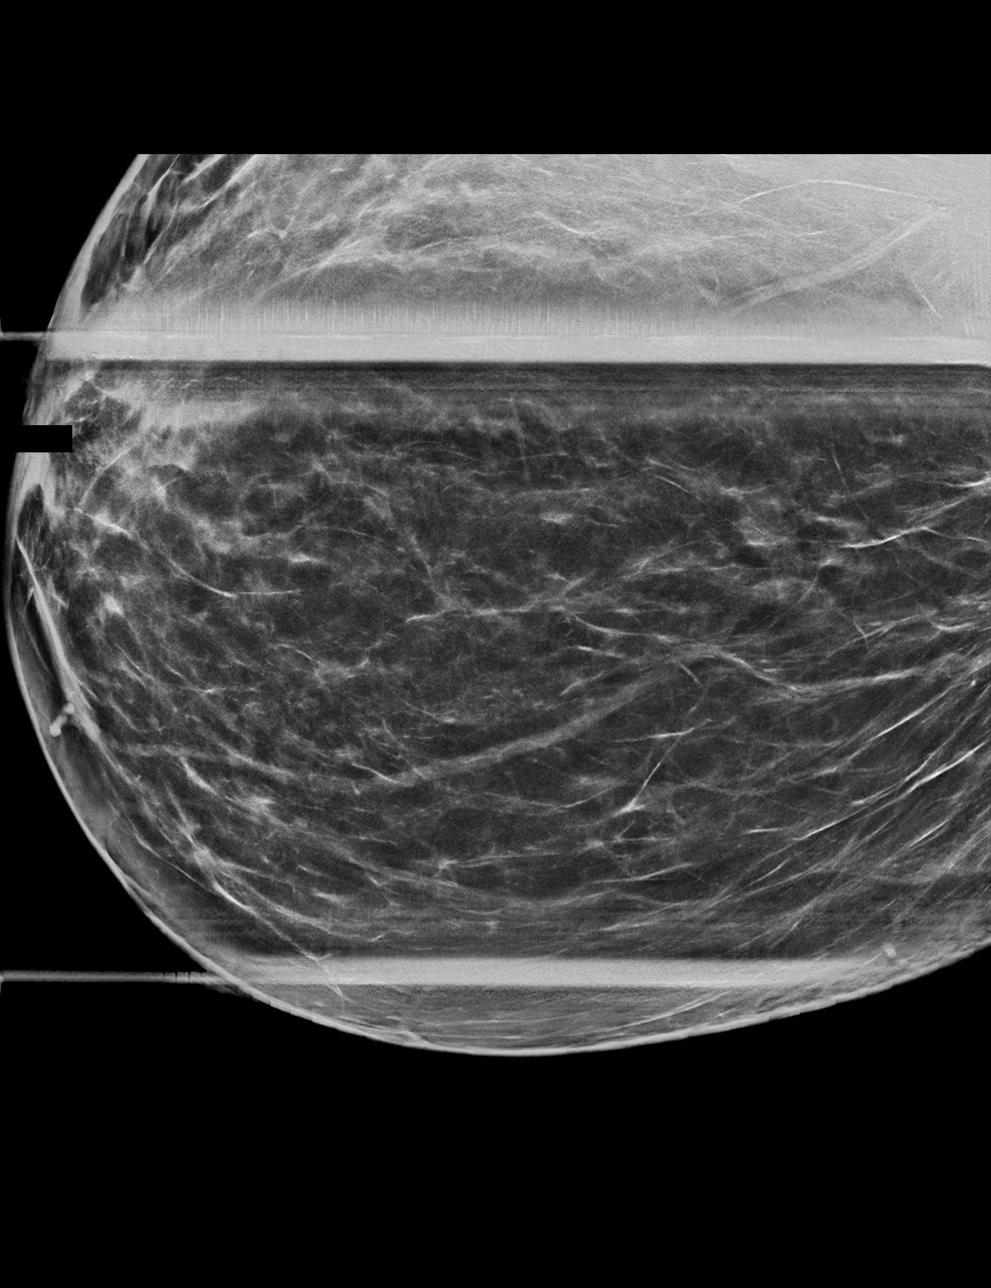

[R CC synth-2D]
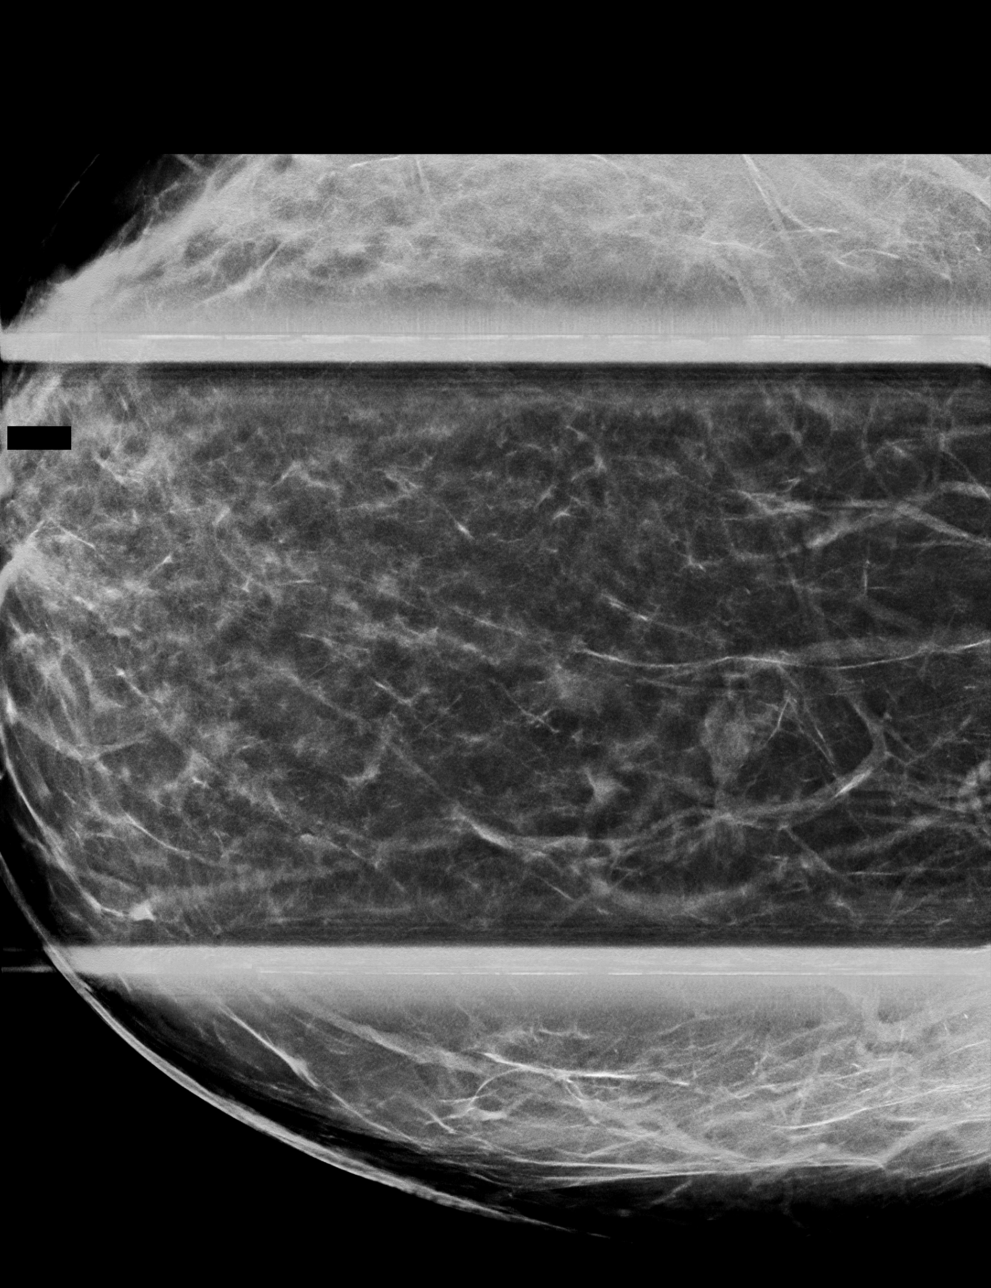

[R MLO tomo · tomo slice 41/80.0]
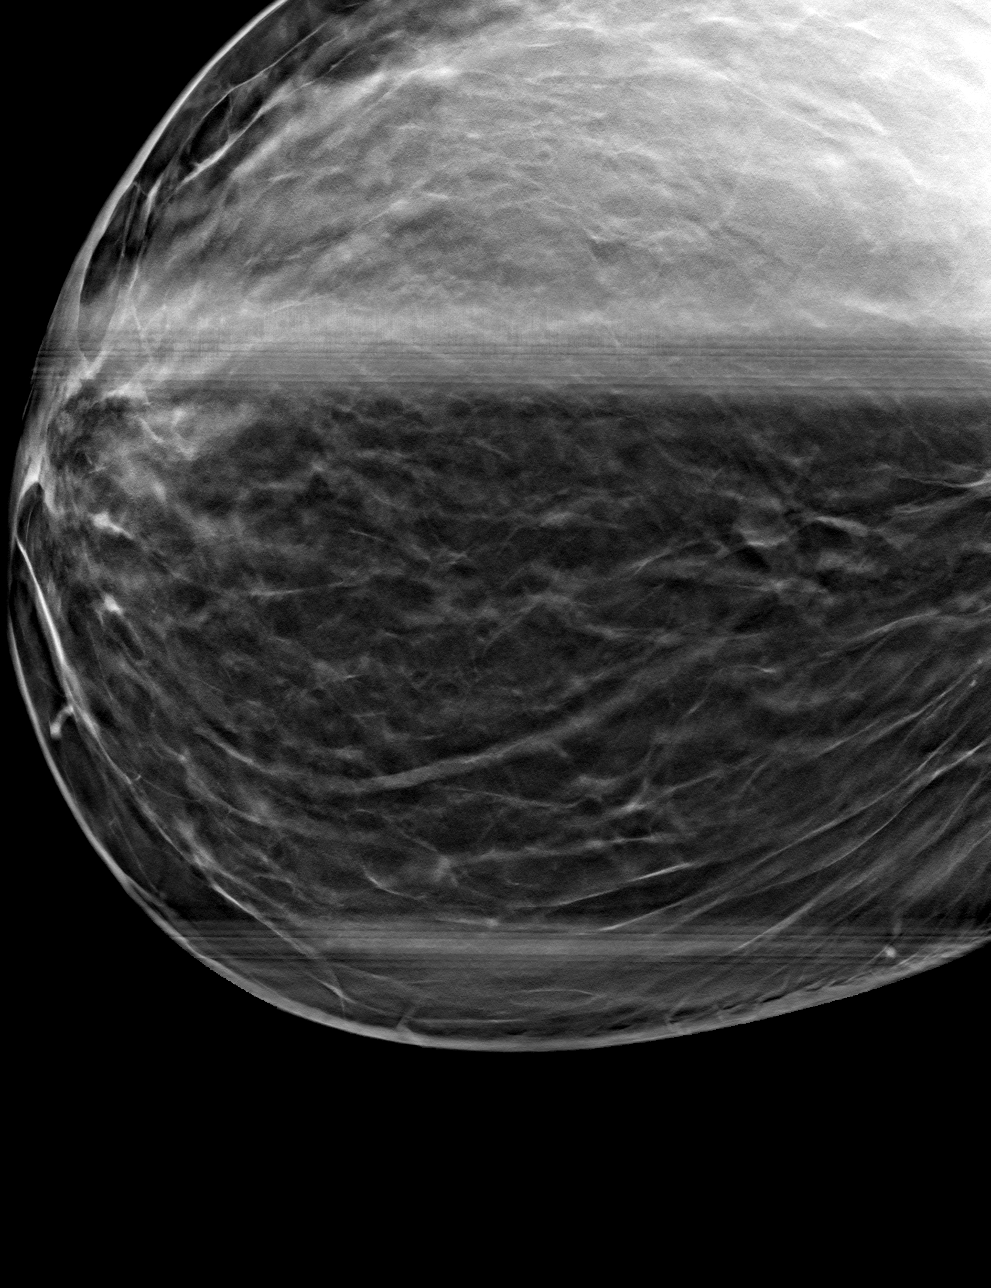

[R CC tomo · tomo slice 35/69.0]
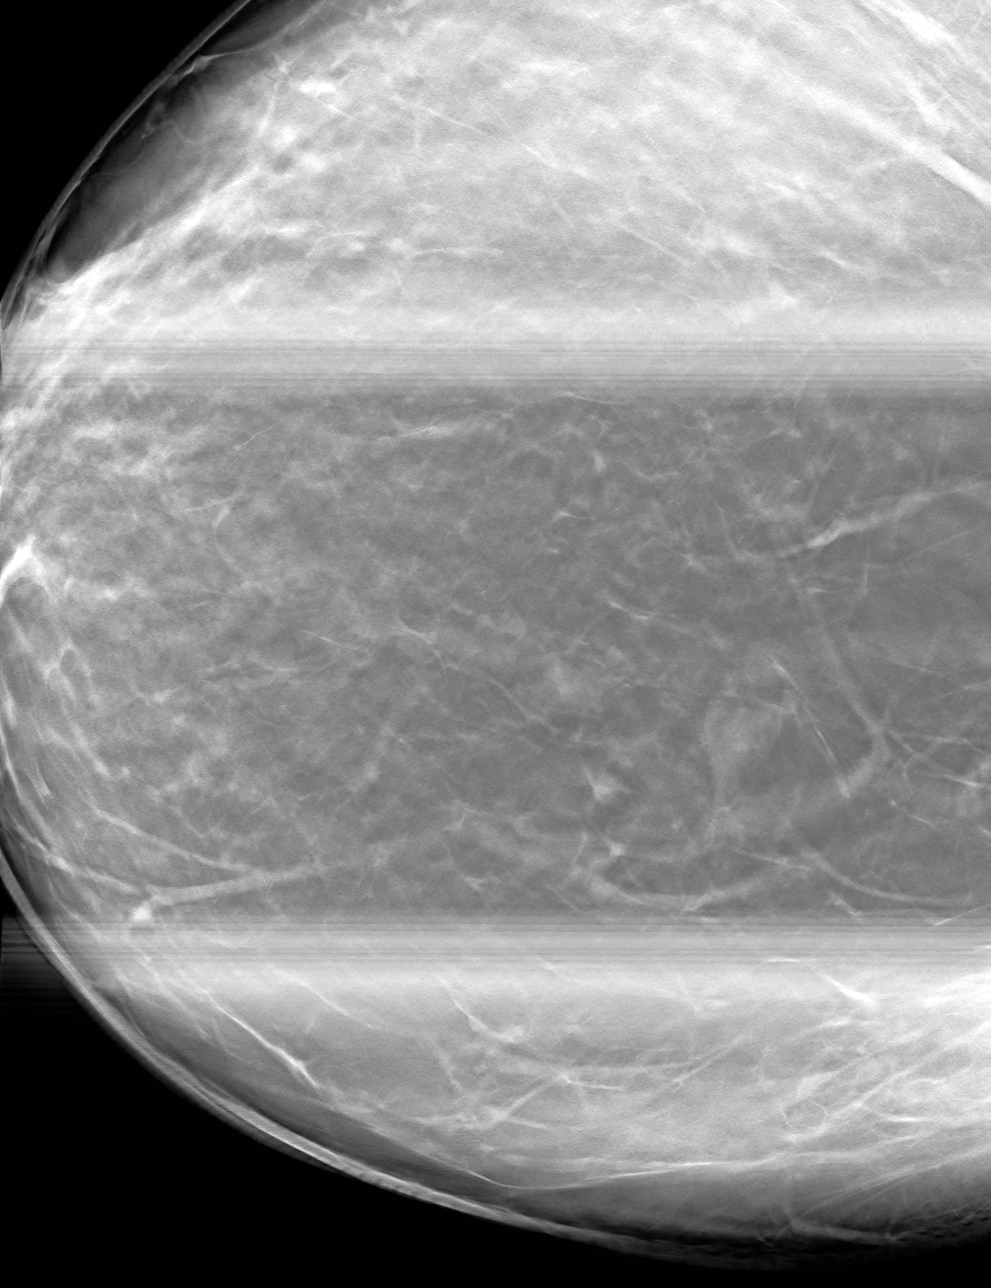

[4 of 12 positions shown; findings below may reference images not displayed]

ACR Breast Density Category b: There are scattered areas of
fibroglandular density.
FINDINGS: Spot-compression CC and MLO views of the area of concern were
obtained.

Partially obscured isodense mass in the inner breast at middle depth
measuring approximately 1.5 cm, possibly with central fat on the MLO
view. There is no associated architectural distortion or suspicious
calcifications.

Targeted ultrasound is performed, demonstrating no sonographic
correlate for the mammographic mass. No cyst, solid mass or abnormal
acoustic shadowing is identified in the inner breast with imaging
from 1 o'clock through 5 o'clock.
IMPRESSION: Likely benign mass involving the inner RIGHT breast without
sonographic correlate.

RECOMMENDATION:
Diagnostic RIGHT mammogram in 6 months.

I have discussed the findings and recommendations with the patient.
If applicable, a reminder letter will be sent to the patient
regarding the next appointment.

BI-RADS CATEGORY  3: Probably benign.

## 2023-10-06 IMAGING — US US BREAST*R* LIMITED INC AXILLA
1 series · 7 of 7 positions shown · non-contrast
Comparison: Previous exam(s).

CLINICAL DATA: Recall from screening mammography, possible mass
involving the inner RIGHT breast at middle depth.

EXAM:
DIGITAL DIAGNOSTIC UNILATERAL RIGHT MAMMOGRAM WITH TOMOSYNTHESIS AND
CAD; ULTRASOUND RIGHT BREAST LIMITED
TECHNIQUE: Right digital diagnostic mammography and breast tomosynthesis was
performed. The images were evaluated with computer-aided detection.;
Targeted ultrasound examination of the right breast was performed

[Series 1: us breast*right* limited inc axilla · 0.07mm/px · 7 of 7 slices shown]
[im 1/7]
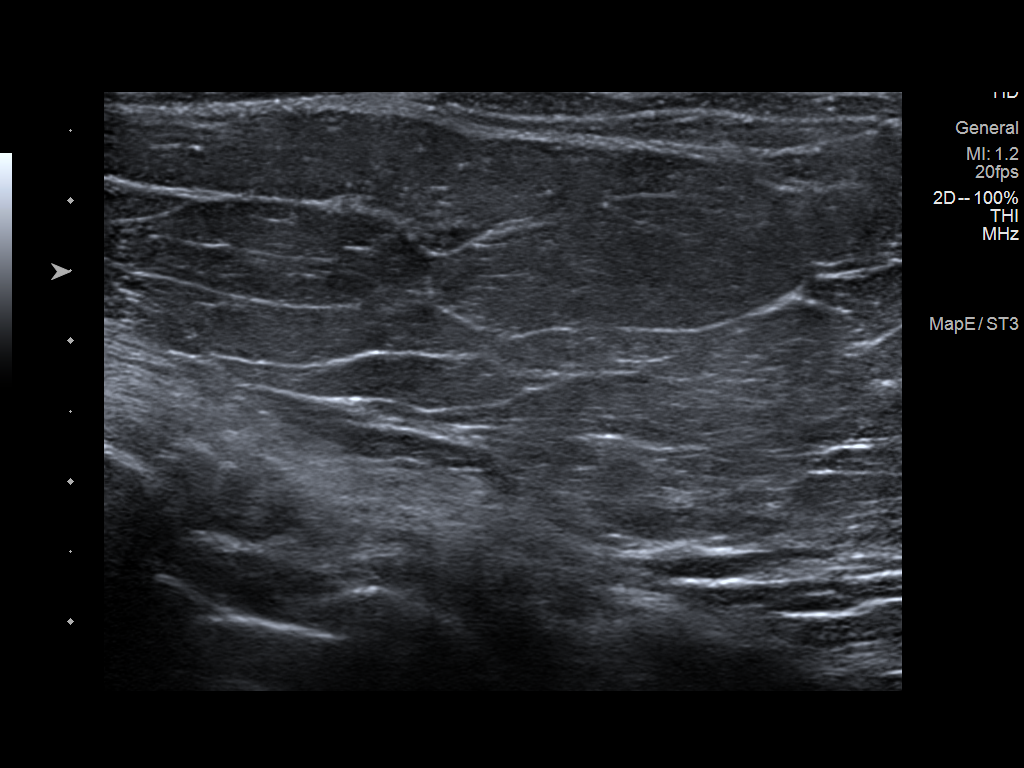
[im 2/7]
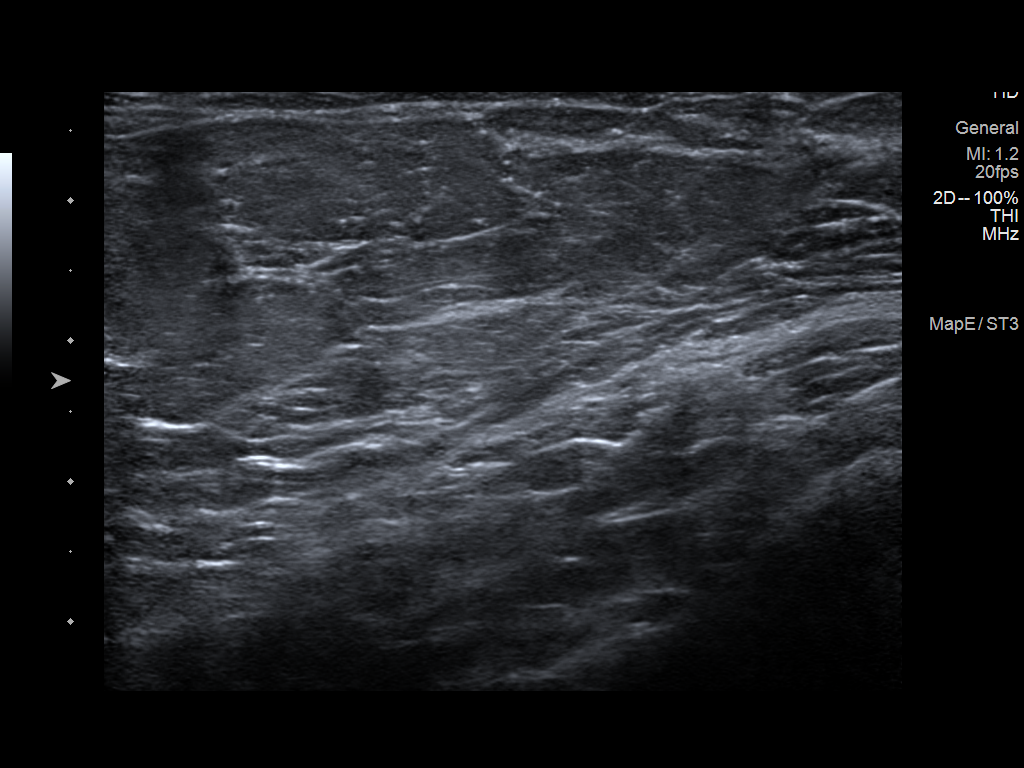
[im 3/7]
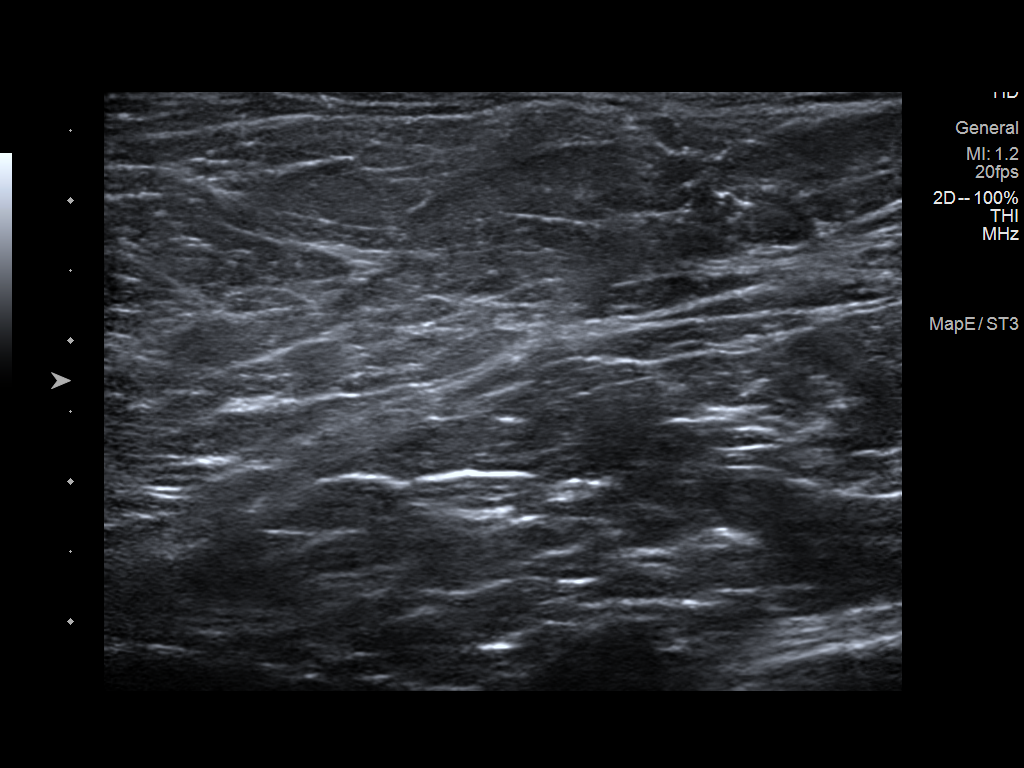
[im 4/7]
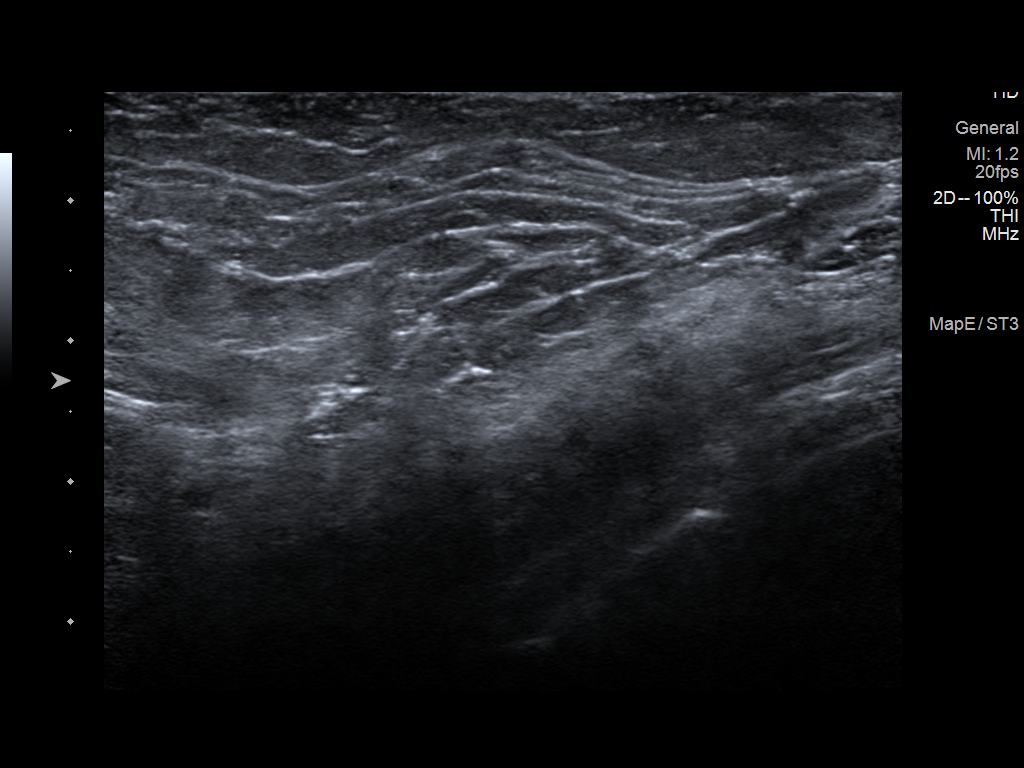
[im 5/7]
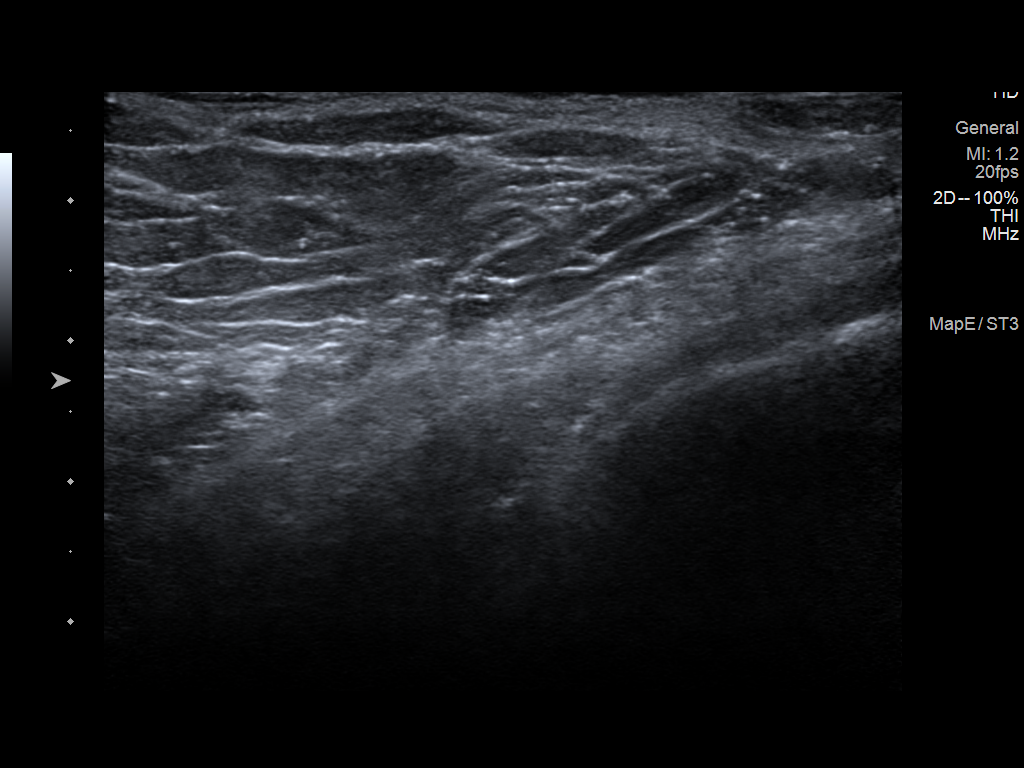
[im 6/7]
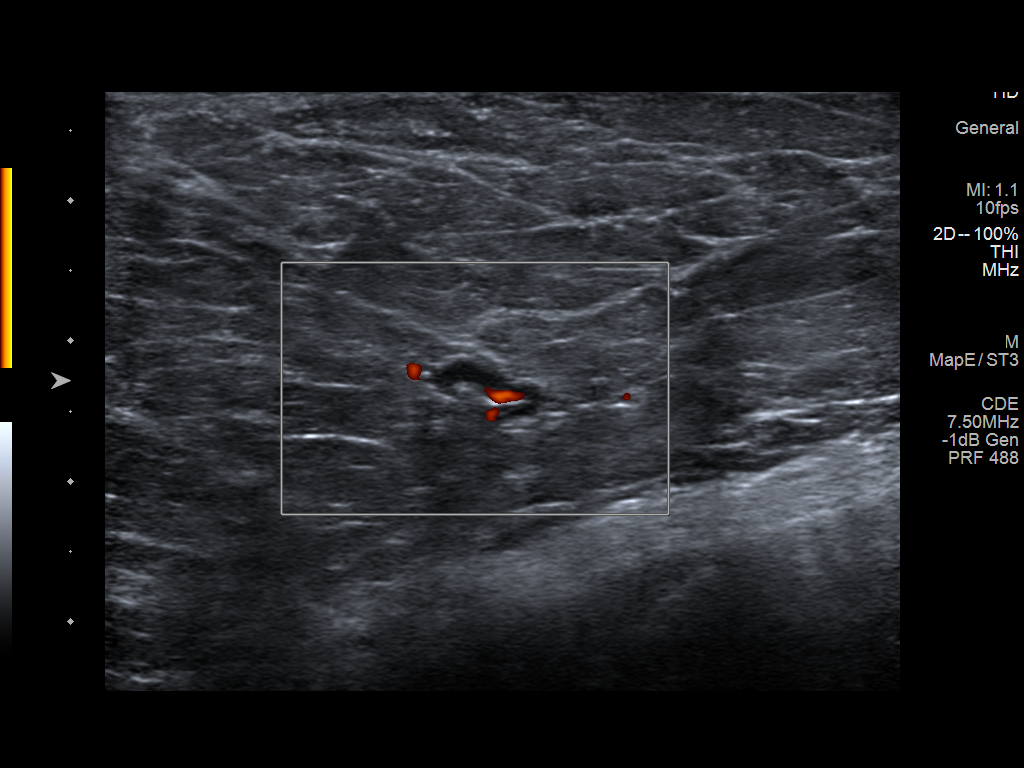
[im 7/7]
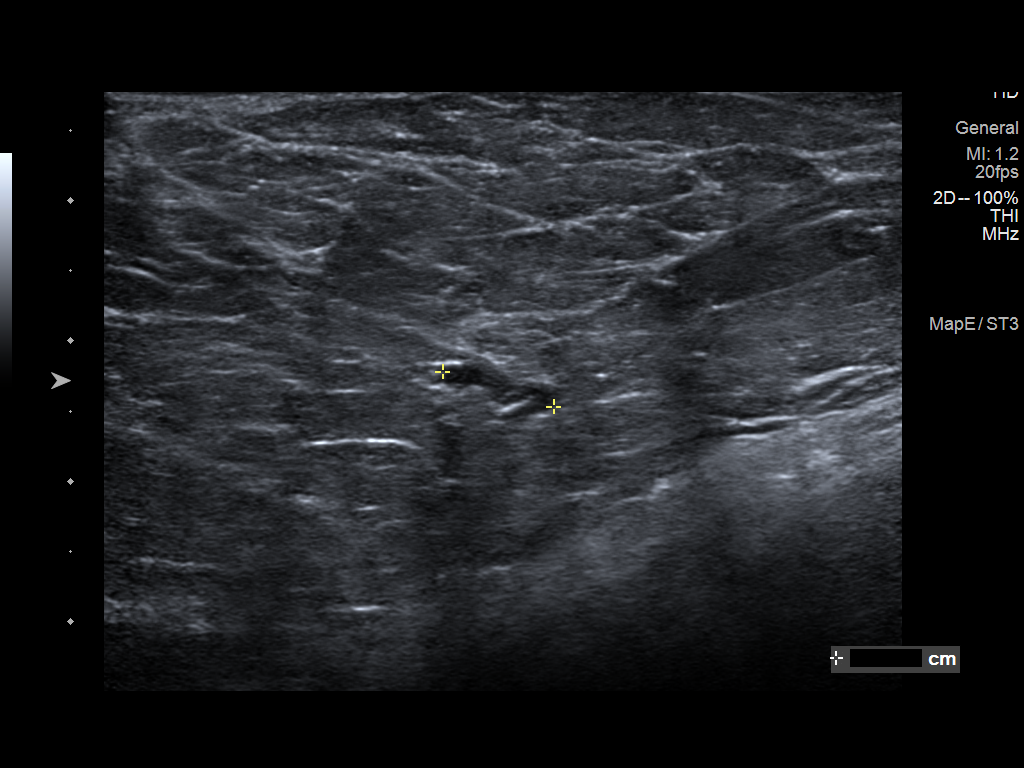

[7 of 7 positions shown; findings below may reference images not displayed]

ACR Breast Density Category b: There are scattered areas of
fibroglandular density.
FINDINGS: Spot-compression CC and MLO views of the area of concern were
obtained.

Partially obscured isodense mass in the inner breast at middle depth
measuring approximately 1.5 cm, possibly with central fat on the MLO
view. There is no associated architectural distortion or suspicious
calcifications.

Targeted ultrasound is performed, demonstrating no sonographic
correlate for the mammographic mass. No cyst, solid mass or abnormal
acoustic shadowing is identified in the inner breast with imaging
from 1 o'clock through 5 o'clock.
IMPRESSION: Likely benign mass involving the inner RIGHT breast without
sonographic correlate.

RECOMMENDATION:
Diagnostic RIGHT mammogram in 6 months.

I have discussed the findings and recommendations with the patient.
If applicable, a reminder letter will be sent to the patient
regarding the next appointment.

BI-RADS CATEGORY  3: Probably benign.

## 2023-10-09 ENCOUNTER — Ambulatory Visit: Payer: 59

## 2023-10-09 DIAGNOSIS — Z3202 Encounter for pregnancy test, result negative: Secondary | ICD-10-CM

## 2023-10-09 DIAGNOSIS — Z3200 Encounter for pregnancy test, result unknown: Secondary | ICD-10-CM

## 2023-10-09 LAB — POCT URINE PREGNANCY: Preg Test, Ur: NEGATIVE

## 2023-10-09 NOTE — Progress Notes (Signed)
Teresa Arnold presents today for UPT.  LMP: 08-24-23    OBJECTIVE: Appears well, in no apparent distress.  OB History     Gravida  6   Para  4   Term  4   Preterm  0   AB  1   Living  4      SAB  1   IAB  0   Ectopic  0   Multiple  0   Live Births  4          Home UPT Result: Positive In-Office UPT result: Negative x2 I have reviewed the patient's medical, obstetrical, social, and family histories, and medications.   ASSESSMENT: Positive pregnancy test  PLAN Beta HCG collected today due to negative UPT in office. Treatment to be determined once results are received

## 2023-10-10 ENCOUNTER — Other Ambulatory Visit: Payer: Self-pay | Admitting: Neurology

## 2023-10-10 ENCOUNTER — Telehealth: Payer: Self-pay

## 2023-10-10 LAB — BETA HCG QUANT (REF LAB): hCG Quant: 3 m[IU]/mL

## 2023-10-10 NOTE — Telephone Encounter (Signed)
Pt called in to get/discuss hcg results. Pt had a negative UPT in office yesterday and hcg of 3 today. Advised ot of results and advised that provider has not reviewed results yet

## 2023-10-10 NOTE — Telephone Encounter (Signed)
Pt called to notify can start the starter pack again.The pregnancy test was negative

## 2023-10-10 NOTE — Telephone Encounter (Signed)
Call to patient, she stated she will make appointment with ophthalmologist, she denies any palpitations or shortness of breath, and understands that this medication is not recommended for pregnancy.  Script pended and sent to Dr. Terrace Arabia for review

## 2023-10-10 NOTE — Addendum Note (Signed)
Addended by: Lenn Cal on: 10/10/2023 04:03 PM   Modules accepted: Orders

## 2023-10-10 NOTE — Telephone Encounter (Signed)
Call to patient who reports needing to restart mayzent. She stated she stopped last thursday due to positive home pregnancy test. I offered her a visit to discuss restarting medication and she declined. She states she She is not trying to get pregnant but is not preventing it.  Advised I would let Sarah and Dr. Terrace Arabia know for their recommendations

## 2023-10-10 NOTE — Telephone Encounter (Signed)
Per previous note from Jul 31 2022:  " Prior to first dose: Test for CYP2C9 variants to determine CYP2C9 genotype. Review results of a recent CBC and recent (within the last 6 months) transaminase and bilirubin levels, and test for antibodies to varicella zoster virus. Obtain an ophthalmic evaluation of the fundus, including the macula, and a baseline skin examination (before or shortly after initiation of therapy). Obtain ECG for preexisting conduction abnormalities and evaluate concomitant drugs that slow heart rate or atrioventricular conduction.     CYP2C9 genotypes *1/*1, *1/*2, or *2/*2, titrate over 5 days (use 12-tablet starter pack): Days 1 and 2, 0.25 mg/day orally; day 3, 0.5 mg/day; day 4, 0.75 mg/day; day 5, 1.25 mg/day; maintenance dosage beginning on day 6 and thereafter, 2 mg orally once daily [2] CYP2C9 genotypes *1/*3 or *2/*3, titrate over 4 days (use 7-tablet starter pack): Days 1 and 2, 0.25 mg/day orally; day 3, 0.5 mg/day; day 4, 0.75 mg/day; maintenance dosage beginning on day 5 and thereafter, 1 mg orally once daily [2]"   July 2019 phone note Received CYP2C9 genotype lab result back from [facility] via fax.  Result: *1/*1. The standard maintenance dose (2mg  once daily) is recommended. I spoke with Dr. Epimenio Foot. He reviewed and cleared pt to start Mayzent therapy.      Please call patient, make sure she had a recent eye examination at least within 6 months, EKG within 4 weeks, that did not show significant abnormalities,   Then she should start titration over 5 days, 12 tablets starter pack, day 1 to 0.25 mg daily, 33 0.5 mg daily, day four 0.75 mg daily, day five 1.25 mg daily   Maintenance dose 2 mg daily      She should titrate according to following schedule, may need new Rx, make sure she was seen by her ophthalmologist in past 6 months, no new cardiac symptoms of palpitation, SOB, ect  Titration Regimen to Reach MAYZENT 2 mg Maintenance Dosage   Day 1 0.25 mg 1 x  0.25 mg  Day 2 0.25 mg 1 x 0.25 mg  Day 3 0.50 mg 2 x 0.25 mg  Day 4 0.75 mg 3 x 0.25 mg  Day 5 1.25 mg 5 x 0.25 mg  If one titration dose is missed for more than 24 hours, treatment needs to be reinitiated with Day 1 of the titration regimen

## 2023-10-10 NOTE — Telephone Encounter (Signed)
Pt is asking that Maralyn Sago, NP restarts her Mayzent trial pack

## 2023-10-10 NOTE — Progress Notes (Signed)
Patient was assessed and managed by nursing staff during this encounter. I have reviewed the chart and agree with the documentation and plan. I have also made any necessary editorial changes.  Will need to follow bhcg results due to negative in office tests  Warden Fillers, MD 10/10/2023 4:19 PM

## 2023-10-11 NOTE — Telephone Encounter (Signed)
Pt has called to provide the date of 06-06-23 to April, RN

## 2023-10-11 NOTE — Telephone Encounter (Signed)
Call to patient to give clarity on Dr. Instructions. DR. Terrace Arabia would patient to have visit with ophthalmologist prior to restarting Mayzent. Patient stated she had one in the last six months but not sure when. She will call and find out when it was, however, she was likely on the Mayzent when she saw them last. Advised once she found out when last visit was to inform us.

## 2023-10-11 NOTE — Telephone Encounter (Signed)
Let her ophthalmology faxed over the evaluation report, before we sent in the titration Rx to make sure that her eye exam is fine.

## 2023-10-11 NOTE — Telephone Encounter (Signed)
[facility] Maralyn Sago) following up on whether to dispense the maintenance dosage or dispense the starter pack.  Would like a call back to clarify everything. Can contact at [phone].

## 2023-10-16 ENCOUNTER — Telehealth: Payer: Self-pay | Admitting: Neurology

## 2023-10-16 MED ORDER — MAYZENT STARTER PACK 12 X 0.25 MG PO TBPK: ORAL_TABLET | ORAL | 0 refills | Status: DC

## 2023-10-16 NOTE — Telephone Encounter (Signed)
 Call to patient and advised that we were requesting eye exam report and will follow back up with her once Dr. Onita reviews. She asked about restarting medication on her own and I advised against that. She stated that  this is ridculous to have to keep having eye exams to restart medications. Reviewed safety precautions with pregnancy and mayzent . Patient verbalized understanding. Dr. Onita aware.

## 2023-10-16 NOTE — Addendum Note (Signed)
Addended by: Levert Feinstein on: 10/16/2023 04:54 PM   Modules accepted: Orders

## 2023-10-16 NOTE — Telephone Encounter (Signed)
 Ophthalmologist evaluation by Dr. Lamar Bruckner June 06, 2023: Normal macular, good foveal reflex, visual fields full OU, RNFL thinning with complete loss of ganglionic cell layer OU, OCT indicating she has actually had optic neuritis OU,   In September 2024, she complains of reduced visual field, eye pain with eye movement on the right side, suggestive retrobulbar optic neuritis OD, was treated with IV Solu-Medrol , which did improve her vision

## 2023-10-16 NOTE — Telephone Encounter (Signed)
 Ophthalmologist evaluation by Dr. Lamar Bruckner June 06, 2023: Normal macular, good foveal reflex, visual fields full OU, RNFL thinning with complete loss of ganglionic cell layer OU, OCT indicating she has actually had optic neuritis OU,   In September 2024, she complains of reduced visual field, eye pain with eye movement on the right side, suggestive retrobulbar optic neuritis OD, was treated with IV Solu-Medrol , which did improve her vision  Let her know, I have release Myazent titration kit, may consider other treatment options? Such as ocrelizumab, mavenclad   Meds ordered this encounter  Medications   Siponimod  Fumarate (MAYZENT  STARTER PACK) 12 x 0.25 MG TBPK    Sig: Day 1 0.25 mg 1 x 0.25 mg  Day 2 0.25 mg 1 x 0.25 mg  Day 3 0.50 mg 2 x 0.25 mg  Day 4 0.75 mg 3 x 0.25 mg  Day 5 1.25 mg 5 x 0.25 mg  If one titration dose is missed for more than 24 hours, treatment needs to be reinitiated with Day 1 of the titration regimen    Dispense:  12 each    Refill:  0    Day 1 0.25 mg 1 x 0.25 mg  Day 2 0.25 mg 1 x 0.25 mg  Day 3 0.50 mg 2 x 0.25 mg  Day 4 0.75 mg 3 x 0.25 mg  Day 5 1.25 mg 5 x 0.25 mg  If one titration dose is missed for more than 24 hours, treatment needs to be reinitiated with Day 1 of the titration regimen

## 2023-10-16 NOTE — Telephone Encounter (Signed)
 Error

## 2023-10-16 NOTE — Telephone Encounter (Signed)
If she has normal eye exam in Sept 2024, please fax result over for [place] to review, after that we can release Mayzent titrating Rx.

## 2023-10-16 NOTE — Telephone Encounter (Signed)
Pt calling about starter pack for Mayzent. Send to [facility]. Would like a call from the nurse

## 2023-10-18 MED ORDER — MAYZENT STARTER PACK 12 X 0.25 MG PO TBPK: ORAL_TABLET | ORAL | 0 refills | Status: DC

## 2023-10-18 NOTE — Telephone Encounter (Signed)
 Call to patient, advised that DR. Gracie Lav sent in the mayzent . She also verbalized understanding that she will need yearly eye exams.

## 2023-10-18 NOTE — Telephone Encounter (Signed)
 Called the pt back. She states that she called the pharmacy and they don't have the script. I advised it looks like it was sent to local pharmacy and this has to go through specialty pharmacy. I have resent the starter pack for the patient to specialty pharmacy.

## 2023-10-18 NOTE — Addendum Note (Signed)
 Addended by: Elton Ham on: 10/18/2023 04:25 PM   Modules accepted: Orders

## 2023-10-18 NOTE — Telephone Encounter (Signed)
 Pt returned call. Please call back when available.

## 2023-10-22 ENCOUNTER — Telehealth: Payer: Self-pay

## 2023-10-22 ENCOUNTER — Other Ambulatory Visit: Payer: Self-pay

## 2023-10-22 MED ORDER — MAYZENT STARTER PACK 12 X 0.25 MG PO TBPK: ORAL_TABLET | ORAL | 0 refills | Status: DC

## 2023-10-22 NOTE — Telephone Encounter (Signed)
 Call to confirm script was received, they dont have it. Spoke with Crystal. She advised me to fax it [phone]. Fax sent and received confirmation. Spoke with Thornell Flirt., pharmacy. Verbal order given Mayzent  starter pack. Instructions reviewed with Heidi Llamas. Verbal also given for the 2 mg mayzent  maintenance dose for 30 days supply. Will need refills month by month.   Call back to patient to make aware, appreicative of call. Will confirm with Dr, how many refills to add to maintenance dose.

## 2023-10-22 NOTE — Telephone Encounter (Signed)
 Patient called and states that she called [facility] and the don't have her medication mayzent . I advised we have not had a fax and nothing sent back to us . She gave me [phone], and I spoke with [place]. She stated they faxed us  a script this morning, and I advised we have not received. She provided fax number [phone] to fax script. Call back to patient to make aware.

## 2023-10-24 ENCOUNTER — Telehealth: Payer: Self-pay

## 2023-10-24 NOTE — Telephone Encounter (Signed)
Call to patient, she reports that she spoke with pharmacy and should be getting mayzent medication today or tomorrow.

## 2023-11-12 ENCOUNTER — Other Ambulatory Visit: Payer: Self-pay | Admitting: Neurology

## 2023-12-20 ENCOUNTER — Encounter: Payer: Self-pay | Admitting: Neurology

## 2023-12-20 ENCOUNTER — Ambulatory Visit (INDEPENDENT_AMBULATORY_CARE_PROVIDER_SITE_OTHER): Admitting: Neurology

## 2023-12-20 VITALS — BP 110/64 | HR 68 | Ht 63.5 in | Wt 223.0 lb

## 2023-12-20 DIAGNOSIS — G35 Multiple sclerosis: Secondary | ICD-10-CM | POA: Diagnosis not present

## 2023-12-20 NOTE — Progress Notes (Signed)
 ASSESSMENT AND PLAN 48 y.o. year old female    Relapsing remitting multiple sclerosis  Gait abnormality  Headache  History of left-sided optic neuritis  Diagnosis 2012 presented with gait abnormality 6 months postpartum, 2017 left optic neuritis flare On Rebif 2015-2018, good response to IV steroid April 2017 for left optic neuritis flare; had allergic reaction to Tysabri (diffuse body rash);  on Mayzent since November 2020 (Stopped in October 2023 due to pregnancy with miscarriage, restarted December 2023)  Right optic neuritis in September 2024 while taking Mayzent,  Repeat MRI of the brain with without contrast July 08, 2023, moderate lesion load, no contrast-enhancement,  Evidence of cervical spinal cord involvement based on previous MRI  Dr. Terrace Arabia had extensive discussion with patient about treatment options, because she has recurrent right optic neuritis, may consider switch to stronger immunomodulation therapy, discussed option of Mavenclad, ocrelizumab, Kemsimpta, wishes to continue Mayzent for now  Check CBC, CMP, Vitamin D  Follow-up with ophthalmology  Follow-up in 6 months with Dr. Terrace Arabia, call for worsening symptoms  Data and Record Review: Ophthalmologist evaluation by Dr. Lyn Records June 06, 2023: Normal macular, good foveal reflex, visual fields full OU, RNFL thinning with complete loss of ganglionic cell layer OU, OCT indicating she has actually had optic neuritis OU,  In September 2024, she complains of reduced visual field, eye pain with eye movement on the right side, suggestive retrobulbar optic neuritis OD, was treated with IV Solu-Medrol, which did improve her vision   HISTORY  Teresa Arnold is a 48 year old female, patient of Dr. Anne Hahn, following up for relapsing remitting multiple sclerosis, currently pregnant,   I reviewed and summarized the referring note. PMHx. Relapsing remitting multiple sclerosis Iron deficiency anemia B12 deficiency D  deficiency   She has 4 adult children, unexpected pregnancy, due day is in June 2024   She was diagnosed of relapsing remitting multiple sclerosis 6 months postpartum in 2012, presenting with balance difficulty, numbness of bilateral lower extremities   At her worst, she has to use walker, also developed left visual loss, fortunately symptoms has greatly recovered following IV Solu-Medrol treatment   She was treated with lipid since diagnosis until 2018, had allergic reaction to Silvestre Moment in the past diffuse body rash, has been treated with Mayzent since 2018, tolerating it well, no significant flareup   Last reported flareup once in 2018 with left optic neuritis  Had a miscarriage with D&E 07/31/22, restarted Mayzent in December 2023.    JCV positive. 08/18/22, titer 1.62 in Oct 2023.  vitamin D 14.1 treated with prescription strength.    MRI brain in March 2024 continues to show multiple MS lesions within the brain, no new lesions compared to October 2021.  MRI cervical spine showed stable C-spine lesions, no new ones compared to June 2021.    She began to complain of blurry vision since September 2024, was seen by ophthalmologist, concern for retrobulbar right optic neuritis, received IV Solu-Medrol treatment, did have some improvement, over the years noticed slow worsening gait abnormality, dragging right leg,  Update December 20, 2023 SS: Off Mayzent for about a month in January due to home pregnancy test positive, went to OBGYN, had negative pregnancy blood testing, told going through early menopause. Went back on Mayzent end of Feb. Has been doing well since then. Has noted when she reads has to hold up close harder to see.   Marland Kitchen  PHYSICAL EXAM  Vitals:   07/18/23 1116  BP: 114/76  Pulse: 78  Weight: 221 lb 9.6 oz (100.5 kg)  Height: 5\' 4"  (1.626 m)   Body mass index is 38.04 kg/m.    PHYSICAL EXAMNIATION:  Generalized: Well developed, in no acute distress  Neurological  examination  Mentation: Alert oriented to time, place, history taking. Follows all commands speech and language fluent Cranial nerve II-XII: Pupils were equal round reactive to light. Extraocular movements were full, visual field were full on confrontational test. No significant eye pain. Facial sensation and strength were normal.  Head turning and shoulder shrug were normal and symmetric. Motor: Right leg 3/5, right arm 4.5/5, left side is normal Sensory: Sensory testing is intact to soft touch on all 4 extremities. No evidence of extinction is noted.  Coordination: Cerebellar testing reveals good finger-nose-finger and heel-to-shin bilaterally.  Gait and station: Gait is wide-based, limp on the right Reflexes: Deep tendon reflexes are symmetric but increased to the right side   REVIEW OF SYSTEMS: Out of a complete 14 system review of symptoms, the patient complains only of the following symptoms, and all other reviewed systems are negative.  See HPI  ALLERGIES: Allergies  Allergen Reactions   Other Shortness Of Breath and Itching    Tampons   Gadolinium Derivatives Nausea And Vomiting    Pt only received half dose of 10ml before getting sick.    Sulfa Antibiotics Itching and Rash   Tysabri [Natalizumab] Rash    HOME MEDICATIONS: Outpatient Medications Prior to Visit  Medication Sig Dispense Refill   acetaminophen (TYLENOL) 325 MG tablet Take 650 mg by mouth every 6 (six) hours as needed for moderate pain.      ibuprofen (ADVIL) 600 MG tablet Take 1 tablet (600 mg total) by mouth every 6 (six) hours as needed. 60 tablet 3   MAYZENT 2 MG TABS TAKE 1 TABLET BY MOUTH DAILY  (DISCARD 3 MONTHS AFTER OPENING  BOTTLE) 30 tablet 6   norethindrone (MICRONOR) 0.35 MG tablet Take 1 tablet (0.35 mg total) by mouth daily. 28 tablet 11   oxyCODONE-acetaminophen (PERCOCET/ROXICET) 5-325 MG tablet Take 1 tablet by mouth every 6 (six) hours as needed. 15 tablet 0   pantoprazole (PROTONIX) 40 MG  tablet TAKE 1 TABLET BY MOUTH EVERY DAY 30 tablet 10   valACYclovir (VALTREX) 1000 MG tablet Take 1 tablet (1,000 mg total) by mouth daily. (Patient taking differently: Take 1,000 mg by mouth daily. Takes as needed for outbreak.) 90 tablet 2   No facility-administered medications prior to visit.    PAST MEDICAL HISTORY: Past Medical History:  Diagnosis Date   Bronchitis    Chronic insomnia 05/22/2017   GERD (gastroesophageal reflux disease)    Iron deficiency anemia 05/22/2017   Morbid obesity (HCC) 05/22/2017   MS (multiple sclerosis) (HCC)    Vitamin B12 deficiency 11/2019   Vitamin D deficiency 11/2019    PAST SURGICAL HISTORY: Past Surgical History:  Procedure Laterality Date   CESAREAN SECTION  04/26/1996   x4   CESAREAN SECTION  04/09/2007   CESAREAN SECTION  01/20/2009   CESAREAN SECTION  02/19/2010   DILATION AND EVACUATION N/A 07/31/2022   Procedure: DILATATION AND EVACUATION;  Surgeon: Catalina Antigua, MD;  Location: [facility] OR;  Service: Gynecology;  Laterality: N/A;    FAMILY HISTORY: Family History  Problem Relation Age of Onset   Stroke Father    Stroke Mother    Hypertension Mother    Hypertension Other     SOCIAL HISTORY: Social History   Socioeconomic History  Marital status: Single    Spouse name: Not on file   Number of children: 4   Years of education: 14   Highest education level: 12th grade  Occupational History   Not on file  Tobacco Use   Smoking status: Never   Smokeless tobacco: Never  Vaping Use   Vaping status: Never Used  Substance and Sexual Activity   Alcohol use: Not Currently    Comment: not in 3 years   Drug use: No   Sexual activity: Yes    Partners: Male    Birth control/protection: None  Other Topics Concern   Not on file  Social History Narrative   Patient is single and lives with her family.   Patient has four children.   Patient has a college education.   Patient is right-handed.   Patient does not drink any  caffeine.   Social Drivers of Corporate investment banker Strain: Low Risk  (02/13/2023)   Overall Financial Resource Strain (CARDIA)    Difficulty of Paying Living Expenses: Not very hard  Food Insecurity: No Food Insecurity (02/13/2023)   Hunger Vital Sign    Worried About Running Out of Food in the Last Year: Never true    Ran Out of Food in the Last Year: Never true  Transportation Needs: No Transportation Needs (02/13/2023)   PRAPARE - Administrator, Civil Service (Medical): No    Lack of Transportation (Non-Medical): No  Physical Activity: Inactive (02/13/2023)   Exercise Vital Sign    Days of Exercise per Week: 0 days    Minutes of Exercise per Session: 0 min  Stress: No Stress Concern Present (02/13/2023)   [employer] of Occupational Health - Occupational Stress Questionnaire    Feeling of Stress : Only a little  Social Connections: Unknown (02/13/2023)   Social Connection and Isolation Panel [NHANES]    Frequency of Communication with Friends and Family: Never    Frequency of Social Gatherings with Friends and Family: Never    Attends Religious Services: More than 4 times per year    Active Member of [employer] or Organizations: No    Attends Banker Meetings: Never    Marital Status: Patient declined  Recent Concern: Social Connections - Socially Isolated (12/28/2022)   Social Connection and Isolation Panel [NHANES]    Frequency of Communication with Friends and Family: More than three times a week    Frequency of Social Gatherings with Friends and Family: More than three times a week    Attends Religious Services: Never    Database administrator or Organizations: No    Attends Banker Meetings: Never    Marital Status: Never married  Intimate Partner Violence: Not At Risk (12/28/2022)   Humiliation, Afraid, Rape, and Kick questionnaire    Fear of Current or Ex-Partner: No    Emotionally Abused: No    Physically Abused: No    Sexually  Abused: No     DIAGNOSTIC DATA (LABS, IMAGING, TESTING) - I reviewed patient records, labs, notes, testing and imaging myself where available.  Lab Results  Component Value Date   WBC 3.4 05/31/2023   HGB 10.8 (L) 05/31/2023   HCT 34.5 05/31/2023   MCV 81 05/31/2023   PLT 369 05/31/2023      Component Value Date/Time   NA 140 05/31/2023 1033   K 4.1 05/31/2023 1033   CL 105 05/31/2023 1033   CO2 21 05/31/2023 1033   GLUCOSE  83 05/31/2023 1033   GLUCOSE 86 05/21/2017 1142   BUN 10 05/31/2023 1033   CREATININE 0.71 05/31/2023 1033   CREATININE 0.67 05/21/2017 1142   CALCIUM 9.2 05/31/2023 1033   PROT 6.8 05/31/2023 1033   ALBUMIN 4.3 05/31/2023 1033   AST 12 05/31/2023 1033   ALT 10 05/31/2023 1033   ALKPHOS 79 05/31/2023 1033   BILITOT 0.3 05/31/2023 1033   GFRNONAA 110 06/08/2020 1018   GFRNONAA 111 05/21/2017 1142   GFRAA 127 06/08/2020 1018   GFRAA 128 05/21/2017 1142   Lab Results  Component Value Date   CHOL 159 11/26/2020   HDL 65 11/26/2020   LDLCALC 84 11/26/2020   TRIG 44 11/26/2020   CHOLHDL 2.4 11/26/2020   Lab Results  Component Value Date   HGBA1C 5.4 05/31/2023   Lab Results  Component Value Date   VITAMINB12 387 11/26/2020   Lab Results  Component Value Date   TSH 1.220 05/31/2023   Otila Kluver, DNP  [facility] [address] [place], [place] [address] [phone]

## 2023-12-20 NOTE — Patient Instructions (Signed)
 Continue Mayzent.  Check labs today.  Follow-up with ophthalmology.  Please call for any new or worsening symptoms.  Follow-up in 6 months.  Thanks!!

## 2023-12-21 ENCOUNTER — Telehealth: Payer: Self-pay | Admitting: Neurology

## 2023-12-21 LAB — COMPREHENSIVE METABOLIC PANEL WITH GFR
ALT: 11 IU/L (ref 0–32)
AST: 13 IU/L (ref 0–40)
Albumin: 4 g/dL (ref 3.9–4.9)
Alkaline Phosphatase: 85 IU/L (ref 44–121)
BUN/Creatinine Ratio: 15 (ref 9–23)
BUN: 11 mg/dL (ref 6–24)
Bilirubin Total: 0.2 mg/dL (ref 0.0–1.2)
CO2: 22 mmol/L (ref 20–29)
Calcium: 9.1 mg/dL (ref 8.7–10.2)
Chloride: 104 mmol/L (ref 96–106)
Creatinine, Ser: 0.74 mg/dL (ref 0.57–1.00)
Globulin, Total: 3 g/dL (ref 1.5–4.5)
Glucose: 77 mg/dL (ref 70–99)
Potassium: 5 mmol/L (ref 3.5–5.2)
Sodium: 139 mmol/L (ref 134–144)
Total Protein: 7 g/dL (ref 6.0–8.5)
eGFR: 102 mL/min/{1.73_m2} (ref >59)

## 2023-12-21 LAB — CBC WITH DIFFERENTIAL/PLATELET
Basophils Absolute: 0 10*3/uL (ref 0.0–0.2)
Basos: 1 %
EOS (ABSOLUTE): 0 10*3/uL (ref 0.0–0.4)
Eos: 1 %
Hematocrit: 36.2 % (ref 34.0–46.6)
Hemoglobin: 11.2 g/dL (ref 11.1–15.9)
Immature Grans (Abs): 0.1 10*3/uL (ref 0.0–0.1)
Immature Granulocytes: 1 %
Lymphocytes Absolute: 0.2 10*3/uL — ABNORMAL LOW (ref 0.7–3.1)
Lymphs: 6 %
MCH: 25.1 pg — ABNORMAL LOW (ref 26.6–33.0)
MCHC: 30.9 g/dL — ABNORMAL LOW (ref 31.5–35.7)
MCV: 81 fL (ref 79–97)
Monocytes Absolute: 0.5 10*3/uL (ref 0.1–0.9)
Monocytes: 14 %
Neutrophils Absolute: 2.9 10*3/uL (ref 1.4–7.0)
Neutrophils: 77 %
Platelets: 374 10*3/uL (ref 150–450)
RBC: 4.47 x10E6/uL (ref 3.77–5.28)
RDW: 12.4 % (ref 11.7–15.4)
WBC: 3.8 10*3/uL (ref 3.4–10.8)

## 2023-12-21 LAB — VITAMIN D 25 HYDROXY (VIT D DEFICIENCY, FRACTURES): Vit D, 25-Hydroxy: 14.3 ng/mL — ABNORMAL LOW (ref 30.0–100.0)

## 2023-12-21 MED ORDER — VITAMIN D (ERGOCALCIFEROL) 1.25 MG (50000 UNIT) PO CAPS
50000.0000 [IU] | ORAL_CAPSULE | ORAL | 0 refills | Status: DC
Start: 2023-12-21 — End: 2024-02-13

## 2023-12-21 NOTE — Telephone Encounter (Signed)
 Labs show stable low absol lymph count 0.2 on Mayzent. Vitamin D is low at 14.3. I will send in rx Vitamin D 50,000 units weekly for 8 weeks then take 1000 units daily, can have PCP recheck.   Meds ordered this encounter  Medications   Vitamin D, Ergocalciferol, (DRISDOL) 1.25 MG (50000 UNIT) CAPS capsule    Sig: Take 1 capsule (50,000 Units total) by mouth every 7 (seven) days.    Dispense:  8 capsule    Refill:  0

## 2023-12-24 NOTE — Telephone Encounter (Signed)
 Call to patient and reviewed results, also reviewed vitamin D script. Patient didn't read label and has already taken 2 tablets. Advised to take 1  tablet every week. Patient verbalized understanding.

## 2023-12-24 NOTE — Telephone Encounter (Signed)
 Low absolute lymphocytes, have been stable over past few years, will continue monitor

## 2024-01-03 ENCOUNTER — Ambulatory Visit: Payer: Self-pay

## 2024-01-03 VITALS — Ht 64.0 in | Wt 223.0 lb

## 2024-01-03 DIAGNOSIS — Z0000 Encounter for general adult medical examination without abnormal findings: Secondary | ICD-10-CM | POA: Diagnosis not present

## 2024-01-03 DIAGNOSIS — Z1231 Encounter for screening mammogram for malignant neoplasm of breast: Secondary | ICD-10-CM | POA: Diagnosis not present

## 2024-01-03 NOTE — Progress Notes (Signed)
 Subjective:   Teresa Arnold is a 48 y.o. female who presents for Medicare Annual (Subsequent) preventive examination.  Visit Complete: Virtual I connected with  Teresa Arnold on 01/03/24 by a audio enabled telemedicine application and verified that I am speaking with the correct person using two identifiers.  Patient Location: Home  Provider Location: Office/Clinic  I discussed the limitations of evaluation and management by telemedicine. The patient expressed understanding and agreed to proceed.  Vital Signs: Because this visit was a virtual/telehealth visit, some criteria may be missing or patient reported. Any vitals not documented were not able to be obtained and vitals that have been documented are patient reported.  Patient Medicare AWV questionnaire was completed by the patient on 01/03/24; I have confirmed that all information answered by patient is correct and no changes since this date.  Cardiac Risk Factors include: obesity (BMI >30kg/m2)     Objective:    Today's Vitals   01/03/24 1314  Weight: 223 lb (101.2 kg)  Height: 5\' 4"  (1.626 m)   Body mass index is 38.28 kg/m.     01/03/2024    1:27 PM 12/28/2022    1:16 PM 07/31/2022    7:26 AM 12/01/2021    2:54 PM 08/02/2020    9:38 AM 08/13/2018    1:43 PM 06/24/2018    2:35 PM  Advanced Directives  Does Patient Have a Medical Advance Directive? No No No No Yes No No  Would patient like information on creating a medical advance directive? No - Patient declined No - Patient declined No - Patient declined No - Patient declined  No - Patient declined No - Patient declined    Current Medications (verified) Outpatient Encounter Medications as of 01/03/2024  Medication Sig   acetaminophen  (TYLENOL ) 325 MG tablet Take 650 mg by mouth every 6 (six) hours as needed for moderate pain.    MAYZENT  2 MG TABS TAKE 1 TABLET BY MOUTH DAILY  (DISCARD 3 MONTHS AFTER OPENING  BOTTLE)   pantoprazole  (PROTONIX ) 40 MG  tablet TAKE 1 TABLET BY MOUTH EVERY DAY   valACYclovir  (VALTREX ) 1000 MG tablet Take 1 tablet (1,000 mg total) by mouth daily. (Patient taking differently: Take 1,000 mg by mouth daily. Takes as needed for outbreak.)   Vitamin D , Ergocalciferol , (DRISDOL ) 1.25 MG (50000 UNIT) CAPS capsule Take 1 capsule (50,000 Units total) by mouth every 7 (seven) days.   No facility-administered encounter medications on file as of 01/03/2024.    Allergies (verified) Other, Gadolinium derivatives, Sulfa antibiotics, and Tysabri [natalizumab]   History: Past Medical History:  Diagnosis Date   Bronchitis    Chronic insomnia 05/22/2017   GERD (gastroesophageal reflux disease)    Iron deficiency anemia 05/22/2017   Morbid obesity (HCC) 05/22/2017   MS (multiple sclerosis) (HCC)    Vitamin B12 deficiency 11/2019   Vitamin D  deficiency 11/2019   Past Surgical History:  Procedure Laterality Date   CESAREAN SECTION  04/26/1996   x4   CESAREAN SECTION  04/09/2007   CESAREAN SECTION  01/20/2009   CESAREAN SECTION  02/19/2010   DILATION AND EVACUATION N/A 07/31/2022   Procedure: DILATATION AND EVACUATION;  Surgeon: Verlyn Goad, MD;  Location: [facility] OR;  Service: Gynecology;  Laterality: N/A;   Family History  Problem Relation Age of Onset   Stroke Father    Stroke Mother    Hypertension Mother    Hypertension Other    Social History   Socioeconomic History   Marital status: Single  Spouse name: Not on file   Number of children: 4   Years of education: 14   Highest education level: 12th grade  Occupational History   Not on file  Tobacco Use   Smoking status: Never   Smokeless tobacco: Never  Vaping Use   Vaping status: Never Used  Substance and Sexual Activity   Alcohol use: Not Currently    Comment: not in 3 years   Drug use: No   Sexual activity: Yes    Partners: Male    Birth control/protection: None  Other Topics Concern   Not on file  Social History Narrative   Patient is  single and lives with her family.   Patient has four children.   Patient has a college education.   Patient is right-handed.   Patient does not drink any caffeine.   Social Drivers of Corporate investment banker Strain: Low Risk  (01/03/2024)   Overall Financial Resource Strain (CARDIA)    Difficulty of Paying Living Expenses: Not hard at all  Food Insecurity: No Food Insecurity (01/03/2024)   Hunger Vital Sign    Worried About Running Out of Food in the Last Year: Never true    Ran Out of Food in the Last Year: Never true  Transportation Needs: No Transportation Needs (01/03/2024)   PRAPARE - Administrator, Civil Service (Medical): No    Lack of Transportation (Non-Medical): No  Physical Activity: Inactive (01/03/2024)   Exercise Vital Sign    Days of Exercise per Week: 0 days    Minutes of Exercise per Session: 0 min  Stress: Stress Concern Present (01/03/2024)   [employer] of Occupational Health - Occupational Stress Questionnaire    Feeling of Stress : Very much  Social Connections: Moderately Isolated (01/03/2024)   Social Connection and Isolation Panel [NHANES]    Frequency of Communication with Friends and Family: Twice a week    Frequency of Social Gatherings with Friends and Family: Never    Attends Religious Services: More than 4 times per year    Active Member of [employer] or Organizations: No    Attends Engineer, structural: Never    Marital Status: Living with partner    Tobacco Counseling Counseling given: Not Answered   Clinical Intake:  Pre-visit preparation completed: Yes  Pain : No/denies pain     BMI - recorded: 38.28 Nutritional Status: BMI > 30  Obese Nutritional Risks: None Diabetes: No  How often do you need to have someone help you when you read instructions, pamphlets, or other written materials from your doctor or pharmacy?: 1 - Never What is the last grade level you completed in school?: some college  Interpreter  Needed?: No      Activities of Daily Living    01/03/2024    1:16 PM  In your present state of health, do you have any difficulty performing the following activities:  Hearing? 0  Vision? 0  Difficulty concentrating or making decisions? 0  Walking or climbing stairs? 0  Dressing or bathing? 0  Doing errands, shopping? 0  Preparing Food and eating ? N  Using the Toilet? N  In the past six months, have you accidently leaked urine? N  Do you have problems with loss of bowel control? N  Managing your Medications? N  Managing your Finances? N  Housekeeping or managing your Housekeeping? N    Patient Care Team: Paseda, Folashade R, FNP as PCP - General (Nurse Practitioner) April Knack,  Gordy Lauber, MD as Consulting Physician (Obstetrics and Gynecology)  Indicate any recent Medical Services you may have received from other than [facility] providers in the past year (date may be approximate).     Assessment:   This is a routine wellness examination for Teresa Arnold.  Hearing/Vision screen No results found.   Goals Addressed               This Visit's Progress     Lose weight (pt-stated)   Not on track     Weight (lb) < 200 lb (90.7 kg)   223 lb (101.2 kg)      Depression Screen    01/03/2024    1:29 PM 01/03/2024    1:24 PM 02/16/2023    1:31 PM 12/28/2022    1:14 PM 11/10/2022    1:17 PM 08/18/2022   10:48 AM 07/27/2022   10:55 AM  PHQ 2/9 Scores  PHQ - 2 Score 0 0 0 0 0 1 0  PHQ- 9 Score       1    Fall Risk    01/03/2024    1:28 PM 12/28/2022    1:16 PM 11/28/2021   11:46 AM 05/30/2021   11:46 AM 05/28/2020   10:45 AM  Fall Risk   Falls in the past year? 0 0 0 0 0  Number falls in past yr: 0 0 0 0 0  Injury with Fall? 0 0 0 0 0  Risk for fall due to : No Fall Risks No Fall Risks No Fall Risks  No Fall Risks  Follow up Falls evaluation completed Falls prevention discussed Falls evaluation completed      MEDICARE RISK AT HOME: Medicare Risk at Home Any stairs in or around  the home?: No If so, are there any without handrails?: No Home free of loose throw rugs in walkways, pet beds, electrical cords, etc?: Yes Adequate lighting in your home to reduce risk of falls?: Yes Life alert?: No Use of a cane, walker or w/c?: Yes Grab bars in the bathroom?: No Shower chair or bench in shower?: No Elevated toilet seat or a handicapped toilet?: No  TIMED UP AND GO:  Was the test performed?  No    Cognitive Function:    12/01/2021    2:34 PM  MMSE - Mini Mental State Exam  Not completed: Unable to complete        01/03/2024    1:30 PM 12/28/2022    1:16 PM 12/01/2021    2:23 PM  6CIT Screen  What Year? 0 points 0 points 0 points  What month? 0 points 0 points 0 points  What time? 0 points 0 points 0 points  Count back from 20 0 points 0 points 0 points  Months in reverse 0 points 0 points 0 points  Repeat phrase 0 points 0 points 0 points  Total Score 0 points 0 points 0 points    Immunizations Immunization History  Administered Date(s) Administered   Influenza-Unspecified 06/03/2019   PFIZER(Purple Top)SARS-COV-2 Vaccination 06/11/2020, 07/02/2020    TDAP status: Due, Education has been provided regarding the importance of this vaccine. Advised may receive this vaccine at local pharmacy or Health Dept. Aware to provide a copy of the vaccination record if obtained from local pharmacy or Health Dept. Verbalized acceptance and understanding.  Flu Vaccine status: Up to date  Pneumococcal vaccine status: Declined,  Education has been provided regarding the importance of this vaccine but patient still declined. Advised may  receive this vaccine at local pharmacy or Health Dept. Aware to provide a copy of the vaccination record if obtained from local pharmacy or Health Dept. Verbalized acceptance and understanding.   Covid-19 vaccine status: Declined, Education has been provided regarding the importance of this vaccine but patient still declined. Advised may  receive this vaccine at local pharmacy or Health Dept.or vaccine clinic. Aware to provide a copy of the vaccination record if obtained from local pharmacy or Health Dept. Verbalized acceptance and understanding.  Qualifies for Shingles Vaccine? No   Zostavax completed No   Shingrix Completed?: No.    Education has been provided regarding the importance of this vaccine. Patient has been advised to call insurance company to determine out of pocket expense if they have not yet received this vaccine. Advised may also receive vaccine at local pharmacy or Health Dept. Verbalized acceptance and understanding.  Screening Tests Health Maintenance  Topic Date Due   HIV Screening  Never done   DTaP/Tdap/Td (1 - Tdap) Never done   COVID-19 Vaccine (3 - Pfizer risk series) 07/30/2020   Colonoscopy  Never done   INFLUENZA VACCINE  04/11/2024   Medicare Annual Wellness (AWV)  01/02/2025   Cervical Cancer Screening (HPV/Pap Cotest)  02/14/2027   Hepatitis C Screening  Completed   HPV VACCINES  Aged Out   Meningococcal B Vaccine  Aged Out    Health Maintenance  Health Maintenance Due  Topic Date Due   HIV Screening  Never done   DTaP/Tdap/Td (1 - Tdap) Never done   COVID-19 Vaccine (3 - Pfizer risk series) 07/30/2020   Colonoscopy  Never done      Mammogram status: Completed 12/12/2022. Repeat every year    Lung Cancer Screening: (Low Dose CT Chest recommended if Age 51-80 years, 20 pack-year currently smoking OR have quit w/in 15years.) does not qualify.   Lung Cancer Screening Referral: n/a  Additional Screening:  Hepatitis C Screening: does not qualify; Completed 01/31/2018  Vision Screening: Recommended annual ophthalmology exams for early detection of glaucoma and other disorders of the eye. Is the patient up to date with their annual eye exam?  Yes  Who is the provider or what is the name of the office in which the patient attends annual eye exams? [facility] If pt is not established  with a provider, would they like to be referred to a provider to establish care? No .   Dental Screening: Recommended annual dental exams for proper oral hygiene  Diabetic Foot Exam: N/A  Community Resource Referral / Chronic Care Management: CRR required this visit?  No   CCM required this visit?  No     Plan:     I have personally reviewed and noted the following in the patient's chart:   Medical and social history Use of alcohol, tobacco or illicit drugs  Current medications and supplements including opioid prescriptions. Patient is not currently taking opioid prescriptions. Functional ability and status Nutritional status Physical activity Advanced directives List of other physicians Hospitalizations, surgeries, and ER visits in previous 12 months Vitals Screenings to include cognitive, depression, and falls Referrals and appointments  In addition, I have reviewed and discussed with patient certain preventive protocols, quality metrics, and best practice recommendations. A written personalized care plan for preventive services as well as general preventive health recommendations were provided to patient.     Julian Obey, RMA   01/03/2024   After Visit Summary: (MyChart) Due to this being a telephonic visit, the after visit  summary with patients personalized plan was offered to patient via MyChart   Nurse Notes: N/A

## 2024-01-22 ENCOUNTER — Ambulatory Visit: Payer: 59 | Admitting: Neurology

## 2024-02-13 ENCOUNTER — Other Ambulatory Visit: Payer: Self-pay | Admitting: Neurology

## 2024-02-14 ENCOUNTER — Other Ambulatory Visit: Payer: Self-pay

## 2024-02-14 NOTE — Progress Notes (Signed)
 Error

## 2024-02-14 NOTE — Telephone Encounter (Signed)
 Please advise [facility]

## 2024-02-15 ENCOUNTER — Other Ambulatory Visit: Payer: Self-pay | Admitting: Nurse Practitioner

## 2024-02-15 MED ORDER — VALACYCLOVIR HCL 1 G PO TABS: 1000.0000 mg | ORAL_TABLET | Freq: Every day | ORAL | 0 refills | Status: AC

## 2024-03-07 ENCOUNTER — Ambulatory Visit: Payer: Self-pay | Admitting: Nurse Practitioner

## 2024-06-19 ENCOUNTER — Other Ambulatory Visit: Payer: Self-pay | Admitting: Neurology

## 2024-06-24 NOTE — Progress Notes (Signed)
 Teresa Arnold                                          MRN: [id number]   06/24/2024   The [facility]BCI Quality Team Specialist reviewed this patient medical record for the purposes of chart review for care gap closure. The following were reviewed: chart review for care gap closure-colorectal cancer screening.    VBCI Quality Team

## 2024-08-19 NOTE — Progress Notes (Unsigned)
 ASSESSMENT AND PLAN 48 y.o. year old female    Relapsing remitting multiple sclerosis  Gait abnormality  Headache  History of left-sided optic neuritis  Diagnosis 2012 presented with gait abnormality 6 months postpartum, 2017 left optic neuritis flare On Rebif  2015-2018, good response to IV steroid April 2017 for left optic neuritis flare; had allergic reaction to Tysabri (diffuse body rash);  on Mayzent  since November 2020 (Stopped in October 2023 due to pregnancy with miscarriage, restarted December 2023)  Right optic neuritis in September 2024 while taking Mayzent ,  Repeat MRI of the brain with without contrast July 08, 2023, moderate lesion load, no contrast-enhancement,  Evidence of cervical spinal cord involvement based on previous MRI  Dr. Onita had extensive discussion with patient about treatment options, because she has recurrent right optic neuritis, may consider switch to stronger immunomodulation therapy, discussed option of Mavenclad, ocrelizumab, Kemsimpta, wishes to continue Mayzent  for now  Check CBC, CMP, Vitamin D   Follow-up with ophthalmology  Follow-up in 6 months with Dr. Onita, call for worsening symptoms  Data and Record Review: Ophthalmologist evaluation by Dr. Lamar Bruckner June 06, 2023: Normal macular, good foveal reflex, visual fields full OU, RNFL thinning with complete loss of ganglionic cell layer OU, OCT indicating she has actually had optic neuritis OU,  In September 2024, she complains of reduced visual field, eye pain with eye movement on the right side, suggestive retrobulbar optic neuritis OD, was treated with IV Solu-Medrol , which did improve her vision   HISTORY  Teresa Arnold is a 48 year old female, patient of Dr. Jenel, following up for relapsing remitting multiple sclerosis, currently pregnant,   I reviewed and summarized the referring note. PMHx. Relapsing remitting multiple sclerosis Iron deficiency anemia B12 deficiency D  deficiency   She has 4 adult children, unexpected pregnancy, due day is in June 2024   She was diagnosed of relapsing remitting multiple sclerosis 6 months postpartum in 2012, presenting with balance difficulty, numbness of bilateral lower extremities   At her worst, she has to use walker, also developed left visual loss, fortunately symptoms has greatly recovered following IV Solu-Medrol  treatment   She was treated with lipid since diagnosis until 2018, had allergic reaction to Meliton in the past diffuse body rash, has been treated with Mayzent  since 2018, tolerating it well, no significant flareup   Last reported flareup once in 2018 with left optic neuritis  Had a miscarriage with D&E 07/31/22, restarted Mayzent  in December 2023.    JCV positive. 08/18/22, titer 1.62 in Oct 2023.  vitamin D  14.1 treated with prescription strength.    MRI brain in March 2024 continues to show multiple MS lesions within the brain, no new lesions compared to October 2021.  MRI cervical spine showed stable C-spine lesions, no new ones compared to June 2021.    She began to complain of blurry vision since September 2024, was seen by ophthalmologist, concern for retrobulbar right optic neuritis, received IV Solu-Medrol  treatment, did have some improvement, over the years noticed slow worsening gait abnormality, dragging right leg,  Update December 20, 2023 SS: Off Mayzent  for about a month in January due to home pregnancy test positive, went to OBGYN, had negative pregnancy blood testing, told going through early menopause. Went back on Mayzent  end of Feb. Has been doing well since then. Has noted when she reads has to hold up close harder to see.   Update August 20, 2024 SS:   .  PHYSICAL EXAM  Vitals:  07/18/23 1116  BP: 114/76  Pulse: 78  Weight: 221 lb 9.6 oz (100.5 kg)  Height: 5' 4 (1.626 m)   Body mass index is 38.04 kg/m.    PHYSICAL EXAMNIATION:  Generalized: Well developed, in no acute  distress  Neurological examination  Mentation: Alert oriented to time, place, history taking. Follows all commands speech and language fluent Cranial nerve II-XII: Pupils were equal round reactive to light. Extraocular movements were full, visual field were full on confrontational test. No significant eye pain. Facial sensation and strength were normal.  Head turning and shoulder shrug were normal and symmetric. Motor: Right leg 3/5, right arm 4.5/5, left side is normal Sensory: Sensory testing is intact to soft touch on all 4 extremities. No evidence of extinction is noted.  Coordination: Cerebellar testing reveals good finger-nose-finger and heel-to-shin bilaterally.  Gait and station: Gait is wide-based, limp on the right Reflexes: Deep tendon reflexes are symmetric but increased to the right side   REVIEW OF SYSTEMS: Out of a complete 14 system review of symptoms, the patient complains only of the following symptoms, and all other reviewed systems are negative.  See HPI  ALLERGIES: Allergies  Allergen Reactions   Other Shortness Of Breath and Itching    Tampons   Gadolinium Derivatives Nausea And Vomiting    Pt only received half dose of 10ml before getting sick.    Sulfa Antibiotics Itching and Rash   Tysabri [Natalizumab] Rash    HOME MEDICATIONS: Outpatient Medications Prior to Visit  Medication Sig Dispense Refill   acetaminophen  (TYLENOL ) 325 MG tablet Take 650 mg by mouth every 6 (six) hours as needed for moderate pain.      MAYZENT  2 MG TABS TAKE 1 TABLET BY MOUTH DAILY  (DISCARD 3 MONTHS AFTER OPENING  BOTTLE) 30 tablet 6   pantoprazole  (PROTONIX ) 40 MG tablet TAKE 1 TABLET BY MOUTH EVERY DAY 30 tablet 10   valACYclovir  (VALTREX ) 1000 MG tablet Take 1 tablet (1,000 mg total) by mouth daily. 30 tablet 0   Vitamin D , Ergocalciferol , (DRISDOL ) 1.25 MG (50000 UNIT) CAPS capsule TAKE 1 CAPSULE (50,000 UNITS TOTAL) BY MOUTH EVERY 7 (SEVEN) DAYS 4 capsule 11   No  facility-administered medications prior to visit.    PAST MEDICAL HISTORY: Past Medical History:  Diagnosis Date   Bronchitis    Chronic insomnia 05/22/2017   GERD (gastroesophageal reflux disease)    Iron deficiency anemia 05/22/2017   Morbid obesity (HCC) 05/22/2017   MS (multiple sclerosis)    Vitamin B12 deficiency 11/2019   Vitamin D  deficiency 11/2019    PAST SURGICAL HISTORY: Past Surgical History:  Procedure Laterality Date   CESAREAN SECTION  04/26/1996   x4   CESAREAN SECTION  04/09/2007   CESAREAN SECTION  01/20/2009   CESAREAN SECTION  02/19/2010   DILATION AND EVACUATION N/A 07/31/2022   Procedure: DILATATION AND EVACUATION;  Surgeon: Alger Gong, MD;  Location: [facility] OR;  Service: Gynecology;  Laterality: N/A;    FAMILY HISTORY: Family History  Problem Relation Age of Onset   Stroke Father    Stroke Mother    Hypertension Mother    Hypertension Other     SOCIAL HISTORY: Social History   Socioeconomic History   Marital status: Single    Spouse name: Not on file   Number of children: 4   Years of education: 14   Highest education level: 12th grade  Occupational History   Not on file  Tobacco Use   Smoking status:  Never   Smokeless tobacco: Never  Vaping Use   Vaping status: Never Used  Substance and Sexual Activity   Alcohol use: Not Currently    Comment: not in 3 years   Drug use: No   Sexual activity: Yes    Partners: Male    Birth control/protection: None  Other Topics Concern   Not on file  Social History Narrative   Patient is single and lives with her family.   Patient has four children.   Patient has a college education.   Patient is right-handed.   Patient does not drink any caffeine.   Social Drivers of Corporate Investment Banker Strain: Low Risk  (01/03/2024)   Overall Financial Resource Strain (CARDIA)    Difficulty of Paying Living Expenses: Not hard at all  Food Insecurity: No Food Insecurity (01/03/2024)   Hunger Vital  Sign    Worried About Running Out of Food in the Last Year: Never true    Ran Out of Food in the Last Year: Never true  Transportation Needs: No Transportation Needs (01/03/2024)   PRAPARE - Administrator, Civil Service (Medical): No    Lack of Transportation (Non-Medical): No  Physical Activity: Inactive (01/03/2024)   Exercise Vital Sign    Days of Exercise per Week: 0 days    Minutes of Exercise per Session: 0 min  Stress: Stress Concern Present (01/03/2024)   [employer] of Occupational Health - Occupational Stress Questionnaire    Feeling of Stress : Very much  Social Connections: Moderately Isolated (01/03/2024)   Social Connection and Isolation Panel    Frequency of Communication with Friends and Family: Twice a week    Frequency of Social Gatherings with Friends and Family: Never    Attends Religious Services: More than 4 times per year    Active Member of [employer] or Organizations: No    Attends Banker Meetings: Never    Marital Status: Living with partner  Intimate Partner Violence: Not At Risk (01/03/2024)   Humiliation, Afraid, Rape, and Kick questionnaire    Fear of Current or Ex-Partner: No    Emotionally Abused: No    Physically Abused: No    Sexually Abused: No     DIAGNOSTIC DATA (LABS, IMAGING, TESTING) - I reviewed patient records, labs, notes, testing and imaging myself where available.  Lab Results  Component Value Date   WBC 3.8 12/20/2023   HGB 11.2 12/20/2023   HCT 36.2 12/20/2023   MCV 81 12/20/2023   PLT 374 12/20/2023      Component Value Date/Time   NA 139 12/20/2023 1200   K 5.0 12/20/2023 1200   CL 104 12/20/2023 1200   CO2 22 12/20/2023 1200   GLUCOSE 77 12/20/2023 1200   GLUCOSE 86 05/21/2017 1142   BUN 11 12/20/2023 1200   CREATININE 0.74 12/20/2023 1200   CREATININE 0.67 05/21/2017 1142   CALCIUM 9.1 12/20/2023 1200   PROT 7.0 12/20/2023 1200   ALBUMIN 4.0 12/20/2023 1200   AST 13 12/20/2023 1200   ALT  11 12/20/2023 1200   ALKPHOS 85 12/20/2023 1200   BILITOT 0.2 12/20/2023 1200   GFRNONAA 110 06/08/2020 1018   GFRNONAA 111 05/21/2017 1142   GFRAA 127 06/08/2020 1018   GFRAA 128 05/21/2017 1142   Lab Results  Component Value Date   CHOL 159 11/26/2020   HDL 65 11/26/2020   LDLCALC 84 11/26/2020   TRIG 44 11/26/2020   CHOLHDL 2.4 11/26/2020  Lab Results  Component Value Date   HGBA1C 5.4 05/31/2023   Lab Results  Component Value Date   VITAMINB12 387 11/26/2020   Lab Results  Component Value Date   TSH 1.220 05/31/2023   Lauraine Gayland MANDES, DNP  [facility] [address] [place], [place] [address] [phone]

## 2024-08-20 ENCOUNTER — Encounter: Payer: Self-pay | Admitting: Neurology

## 2024-08-20 ENCOUNTER — Ambulatory Visit: Admitting: Neurology

## 2024-08-20 VITALS — BP 120/80 | HR 70 | Ht 64.0 in

## 2024-08-20 DIAGNOSIS — G35A Relapsing-remitting multiple sclerosis: Secondary | ICD-10-CM

## 2024-08-20 DIAGNOSIS — H469 Unspecified optic neuritis: Secondary | ICD-10-CM | POA: Diagnosis not present

## 2024-08-20 DIAGNOSIS — R269 Unspecified abnormalities of gait and mobility: Secondary | ICD-10-CM

## 2024-08-20 MED ORDER — ONDANSETRON 4 MG PO TBDP: 4.0000 mg | ORAL_TABLET | Freq: Three times a day (TID) | ORAL | 0 refills | Status: AC | PRN

## 2024-08-20 MED ORDER — PREDNISONE 20 MG PO TABS: 20.0000 mg | ORAL_TABLET | Freq: Every day | ORAL | 0 refills | Status: AC

## 2024-08-20 NOTE — Patient Instructions (Signed)
 Check MRI of the brain with and without contrast, will premedicate with prednisone , Zofran  to hopefully prevent nausea and vomiting  Check labs today  Follow-up with ophthalmology  Continue Mayzent , bring insurance letter by office

## 2024-08-21 ENCOUNTER — Telehealth: Payer: Self-pay | Admitting: Neurology

## 2024-08-21 ENCOUNTER — Ambulatory Visit: Payer: Self-pay | Admitting: Neurology

## 2024-08-21 LAB — CBC WITH DIFFERENTIAL/PLATELET
Basophils Absolute: 0 x10E3/uL (ref 0.0–0.2)
Basos: 1 %
EOS (ABSOLUTE): 0 x10E3/uL (ref 0.0–0.4)
Eos: 1 %
Hematocrit: 36.9 % (ref 34.0–46.6)
Hemoglobin: 11.5 g/dL (ref 11.1–15.9)
Immature Grans (Abs): 0.1 x10E3/uL (ref 0.0–0.1)
Immature Granulocytes: 2 %
Lymphocytes Absolute: 0.2 x10E3/uL — ABNORMAL LOW (ref 0.7–3.1)
Lymphs: 5 %
MCH: 25.6 pg — ABNORMAL LOW (ref 26.6–33.0)
MCHC: 31.2 g/dL — ABNORMAL LOW (ref 31.5–35.7)
MCV: 82 fL (ref 79–97)
Monocytes Absolute: 0.7 x10E3/uL (ref 0.1–0.9)
Monocytes: 16 %
Neutrophils Absolute: 3.3 x10E3/uL (ref 1.4–7.0)
Neutrophils: 75 %
Platelets: 360 x10E3/uL (ref 150–450)
RBC: 4.5 x10E6/uL (ref 3.77–5.28)
RDW: 12.1 % (ref 11.7–15.4)
WBC: 4.4 x10E3/uL (ref 3.4–10.8)

## 2024-08-21 LAB — COMPREHENSIVE METABOLIC PANEL WITH GFR
ALT: 9 IU/L (ref 0–32)
AST: 18 IU/L (ref 0–40)
Albumin: 4.2 g/dL (ref 3.9–4.9)
Alkaline Phosphatase: 83 IU/L (ref 41–116)
BUN/Creatinine Ratio: 26 — ABNORMAL HIGH (ref 9–23)
BUN: 18 mg/dL (ref 6–24)
Bilirubin Total: 0.2 mg/dL (ref 0.0–1.2)
CO2: 22 mmol/L (ref 20–29)
Calcium: 9.3 mg/dL (ref 8.7–10.2)
Chloride: 103 mmol/L (ref 96–106)
Creatinine, Ser: 0.68 mg/dL (ref 0.57–1.00)
Globulin, Total: 2.7 g/dL (ref 1.5–4.5)
Glucose: 81 mg/dL (ref 70–99)
Potassium: 4.4 mmol/L (ref 3.5–5.2)
Sodium: 139 mmol/L (ref 134–144)
Total Protein: 6.9 g/dL (ref 6.0–8.5)
eGFR: 109 mL/min/1.73 (ref >59)

## 2024-08-21 LAB — VITAMIN D 25 HYDROXY (VIT D DEFICIENCY, FRACTURES): Vit D, 25-Hydroxy: 18.8 ng/mL — ABNORMAL LOW (ref 30.0–100.0)

## 2024-08-21 NOTE — Telephone Encounter (Signed)
 no auth required sent to [facility] since she has a contrast allergy. [phone]

## 2024-08-22 NOTE — Telephone Encounter (Signed)
 If her insurance changes at the beginning of the year, the PA team can attempt PA for Mayzent . It helps that she is currently taking it and doing well. Kesimpta is another option that looks like was discussed with patient that is highly efficacious but if she is needle phobic, we can argue that it is not an option for her.  I'm not sure if we can get any assistance for her since she has a Medicare plan. That excludes her from the majority of assistance.

## 2024-08-26 ENCOUNTER — Telehealth: Payer: Self-pay | Admitting: Neurology

## 2024-08-26 NOTE — Telephone Encounter (Signed)
 Pt returned call and stated that she didn't know why she takes it. Pt was upset regarding the zofran  and prednisone . She wasn't clear on instructions and wanted to speak directly with sarah slack np as the prednisone  stated: Take before MRI scan, depending on time of MRI, will discuss

## 2024-08-26 NOTE — Telephone Encounter (Signed)
 Patient calling to ask question regarding ondansetron  (ZOFRAN -ODT) 4 MG disintegrating tablet. Would like to have a call back. Patient did not wish to elaborate on questions in regards to the Ondansetron .

## 2024-08-26 NOTE — Telephone Encounter (Signed)
 I called the patient. MRI scheduled for 10:00 friday. Take prednisone  at 8 am, zofran  45 minutes before contrast.

## 2024-08-26 NOTE — Telephone Encounter (Signed)
 Lvm 1st attempt by hf 08/26/24

## 2024-08-27 ENCOUNTER — Telehealth: Payer: Self-pay

## 2024-08-27 ENCOUNTER — Other Ambulatory Visit (HOSPITAL_COMMUNITY): Payer: Self-pay

## 2024-08-27 NOTE — Telephone Encounter (Signed)
 Pharmacy Patient Advocate Encounter   Received notification from Patient Advice Request messages that prior authorization for Mayzent  is required/requested.   Insurance verification completed.   The patient is insured through [employer].   Per test claim: The current 30 day co-pay is, $0.  No PA needed at this time. This test claim was processed through [facility]- copay amounts may vary at other pharmacies due to pharmacy/plan contracts, or as the patient moves through the different stages of their insurance plan.      I can revisit in Jan 2026.

## 2024-08-29 ENCOUNTER — Ambulatory Visit (HOSPITAL_COMMUNITY)
Admission: RE | Admit: 2024-08-29 | Discharge: 2024-08-29 | Disposition: A | Discharge status: Home / Self Care | Source: Ambulatory Visit | Attending: Neurology | Admitting: Neurology

## 2024-08-29 DIAGNOSIS — G35A Relapsing-remitting multiple sclerosis: Secondary | ICD-10-CM | POA: Diagnosis present

## 2024-08-29 MED ORDER — GADOBUTROL 1 MMOL/ML IV SOLN
10.0000 mL | Freq: Once | INTRAVENOUS | Status: AC | PRN
  Administered 2024-08-29: 10 mL via INTRAVENOUS

## 2024-09-10 NOTE — Progress Notes (Signed)
 Chart reviewed, agree above plan ?

## 2024-10-07 NOTE — Telephone Encounter (Signed)
 Pt called stating that Pharmacy informed that PA for medication MAYZENT  2 MG TABS

## 2024-10-08 ENCOUNTER — Other Ambulatory Visit (HOSPITAL_COMMUNITY): Payer: Self-pay

## 2024-10-08 ENCOUNTER — Telehealth: Payer: Self-pay | Admitting: Pharmacy Technician

## 2024-10-08 DIAGNOSIS — G35A Relapsing-remitting multiple sclerosis: Secondary | ICD-10-CM

## 2024-10-08 NOTE — Telephone Encounter (Signed)
 PA has been submitted, and telephone encounter has been created. Please see telephone encounter dated 1.28.26.

## 2024-10-08 NOTE — Telephone Encounter (Signed)
 Pt has called asking if priority can be put on this so she is able to get her medication, pt was informed it was just sent over to Prior Auth team  less than 24 hours ago.

## 2024-10-08 NOTE — Telephone Encounter (Signed)
 Pharmacy Patient Advocate Encounter   Received notification from Pt Calls Messages that prior authorization for MAYZENT  2MG  is required/requested.   Insurance verification completed.   The patient is insured through [facility].   Per test claim: PA required; PA submitted to above mentioned insurance via Latent Key/confirmation #/[facility] Status is pending

## 2024-10-13 NOTE — Telephone Encounter (Signed)
 Pharmacy Patient Advocate Encounter  Received notification from [employer] that Prior Authorization for MAYZENT  2MG   has been DENIED.  See denial reason below. No denial letter attached in CMM. Will attach denial letter to Media tab once received.     PA #/Case ID/Reference #: [id number]

## 2024-10-15 NOTE — Telephone Encounter (Signed)
 I called the patient, looks like Teresa Arnold  was denied.  Patient reports she was able to order it with a $4 co-pay and should arrive today.  Not sure if this will continue, if so she wishes to remain on.  She will call her insurance company to figure out her portion.  If it is not covered and she is not able to afford we discussed switching to Ocrevus or Briumvi.

## 2024-10-15 NOTE — Telephone Encounter (Signed)
 Once Briumvi start form and order has been sent to [facility] or the [facility], they will do the [facility] PA.  Let me know where the order is sent and I will continue to follow!

## 2024-10-15 NOTE — Telephone Encounter (Signed)
 Briumvi start form started.

## 2024-10-15 NOTE — Telephone Encounter (Signed)
 Pt called to request to speak to nurse about  what insurance reported to  her  about PT MEDICATION MAYZENT  2 MG TABS   Pt did not go into details about request

## 2024-10-15 NOTE — Telephone Encounter (Signed)
 I have completed the start form, but need pts signature then Dr. Georgianne. Pt having labs done as well.  Once all done will fax start form, and let Kristin know.

## 2024-10-15 NOTE — Addendum Note (Signed)
 Addended by: GAYLAND LAURAINE PARAS on: 10/15/2024 02:44 PM   Modules accepted: Orders

## 2024-10-15 NOTE — Telephone Encounter (Signed)
 I spoke with patient and Dr. Onita , Mayzent  will not be covered this year with her insurance, she paid $4 for a 1 month supply. Will switch to Briumvi. Will need to get PA from insurance, she'll need labs, will come this week. Once approved will stop Mayzent  for 4 weeks before starting Bruimvi. Check CBC before starting Bruimvi make sure absol lymph has rebounded.   Orders Placed This Encounter  Procedures   CBC with Differential/Platelet   CMP   QuantiFERON-TB Gold Plus   Hepatitis B surface antigen   Hepatitis B surface antibody,qualitative   Hepatitis B core antibody, total   Varicella zoster antibody, IgG   Hepatitis C antibody   HIV Antibody (routine testing w rflx)   IgG, IgA, IgM

## 2024-10-16 ENCOUNTER — Other Ambulatory Visit: Payer: Self-pay

## 2024-10-16 ENCOUNTER — Other Ambulatory Visit

## 2024-10-16 DIAGNOSIS — G35A Relapsing-remitting multiple sclerosis: Secondary | ICD-10-CM

## 2024-10-17 LAB — COMPREHENSIVE METABOLIC PANEL WITH GFR
ALT: 11 [IU]/L (ref 0–32)
AST: 14 [IU]/L (ref 0–40)
Albumin: 4.2 g/dL (ref 3.9–4.9)
Alkaline Phosphatase: 80 [IU]/L (ref 41–116)
BUN/Creatinine Ratio: 18 (ref 9–23)
BUN: 14 mg/dL (ref 6–24)
Bilirubin Total: 0.3 mg/dL (ref 0.0–1.2)
CO2: 23 mmol/L (ref 20–29)
Calcium: 9.3 mg/dL (ref 8.7–10.2)
Chloride: 104 mmol/L (ref 96–106)
Creatinine, Ser: 0.76 mg/dL (ref 0.57–1.00)
Globulin, Total: 2.6 g/dL (ref 1.5–4.5)
Glucose: 84 mg/dL (ref 70–99)
Potassium: 4.6 mmol/L (ref 3.5–5.2)
Sodium: 139 mmol/L (ref 134–144)
Total Protein: 6.8 g/dL (ref 6.0–8.5)
eGFR: 98 mL/min/{1.73_m2}

## 2024-10-17 LAB — CBC WITH DIFFERENTIAL/PLATELET
Basophils Absolute: 0 10*3/uL (ref 0.0–0.2)
Basos: 0 %
EOS (ABSOLUTE): 0.1 10*3/uL (ref 0.0–0.4)
Eos: 1 %
Hematocrit: 36.3 % (ref 34.0–46.6)
Hemoglobin: 11.1 g/dL (ref 11.1–15.9)
Immature Grans (Abs): 0.1 10*3/uL (ref 0.0–0.1)
Immature Granulocytes: 1 %
Lymphocytes Absolute: 0.2 10*3/uL — ABNORMAL LOW (ref 0.7–3.1)
Lymphs: 5 %
MCH: 24.9 pg — ABNORMAL LOW (ref 26.6–33.0)
MCHC: 30.6 g/dL — ABNORMAL LOW (ref 31.5–35.7)
MCV: 81 fL (ref 79–97)
Monocytes Absolute: 0.7 10*3/uL (ref 0.1–0.9)
Monocytes: 15 %
Neutrophils Absolute: 3.6 10*3/uL (ref 1.4–7.0)
Neutrophils: 78 %
Platelets: 352 10*3/uL (ref 150–450)
RBC: 4.46 x10E6/uL (ref 3.77–5.28)
RDW: 12.3 % (ref 11.7–15.4)
WBC: 4.6 10*3/uL (ref 3.4–10.8)

## 2024-10-17 LAB — HEPATITIS B CORE ANTIBODY, TOTAL: Hep B Core Total Ab: NEGATIVE

## 2024-10-17 LAB — HEPATITIS B SURFACE ANTIGEN: Hepatitis B Surface Ag: NEGATIVE

## 2024-10-17 LAB — HEPATITIS C ANTIBODY: Hep C Virus Ab: NONREACTIVE

## 2024-10-17 LAB — IGG, IGA, IGM
IgG (Immunoglobin G), Serum: 1360 mg/dL (ref 586–1602)
IgM (Immunoglobulin M), Srm: 44 mg/dL (ref 26–217)
Immunoglobulin A, (IgA) QN, Serum: 215 mg/dL (ref 87–352)

## 2024-10-17 LAB — QUANTIFERON-TB GOLD PLUS

## 2024-10-17 LAB — HEPATITIS B SURFACE ANTIBODY,QUALITATIVE: Hep B Surface Ab, Qual: NONREACTIVE

## 2024-10-17 LAB — VARICELLA ZOSTER ANTIBODY, IGG: Varicella zoster IgG: REACTIVE

## 2024-10-17 LAB — HIV ANTIBODY (ROUTINE TESTING W REFLEX): HIV Screen 4th Generation wRfx: NONREACTIVE

## 2025-02-19 ENCOUNTER — Ambulatory Visit: Admitting: Neurology
